# Patient Record
Sex: Female | Born: 1940 | Race: White | Hispanic: No | Marital: Married | Smoking: Never smoker
Health system: Southern US, Community
[De-identification: ages and names within clinical notes are randomized; demographics above are authoritative.]

## PROBLEM LIST (undated history)

## (undated) DIAGNOSIS — Z9289 Personal history of other medical treatment: Secondary | ICD-10-CM

## (undated) DIAGNOSIS — F419 Anxiety disorder, unspecified: Secondary | ICD-10-CM

## (undated) DIAGNOSIS — M1990 Unspecified osteoarthritis, unspecified site: Secondary | ICD-10-CM

## (undated) DIAGNOSIS — Z8669 Personal history of other diseases of the nervous system and sense organs: Secondary | ICD-10-CM

## (undated) DIAGNOSIS — K219 Gastro-esophageal reflux disease without esophagitis: Secondary | ICD-10-CM

## (undated) DIAGNOSIS — K222 Esophageal obstruction: Secondary | ICD-10-CM

## (undated) DIAGNOSIS — T8859XA Other complications of anesthesia, initial encounter: Secondary | ICD-10-CM

## (undated) DIAGNOSIS — E785 Hyperlipidemia, unspecified: Secondary | ICD-10-CM

## (undated) DIAGNOSIS — R51 Headache: Secondary | ICD-10-CM

## (undated) DIAGNOSIS — H469 Unspecified optic neuritis: Secondary | ICD-10-CM

## (undated) DIAGNOSIS — E876 Hypokalemia: Secondary | ICD-10-CM

## (undated) DIAGNOSIS — T4145XA Adverse effect of unspecified anesthetic, initial encounter: Secondary | ICD-10-CM

## (undated) DIAGNOSIS — R519 Headache, unspecified: Secondary | ICD-10-CM

## (undated) DIAGNOSIS — C801 Malignant (primary) neoplasm, unspecified: Secondary | ICD-10-CM

## (undated) DIAGNOSIS — Z9889 Other specified postprocedural states: Secondary | ICD-10-CM

## (undated) DIAGNOSIS — I959 Hypotension, unspecified: Secondary | ICD-10-CM

## (undated) DIAGNOSIS — Z8709 Personal history of other diseases of the respiratory system: Secondary | ICD-10-CM

## (undated) DIAGNOSIS — R112 Nausea with vomiting, unspecified: Secondary | ICD-10-CM

## (undated) DIAGNOSIS — F32A Depression, unspecified: Secondary | ICD-10-CM

## (undated) DIAGNOSIS — K5790 Diverticulosis of intestine, part unspecified, without perforation or abscess without bleeding: Secondary | ICD-10-CM

## (undated) DIAGNOSIS — F329 Major depressive disorder, single episode, unspecified: Secondary | ICD-10-CM

## (undated) DIAGNOSIS — K449 Diaphragmatic hernia without obstruction or gangrene: Secondary | ICD-10-CM

## (undated) DIAGNOSIS — J189 Pneumonia, unspecified organism: Secondary | ICD-10-CM

## (undated) HISTORY — PX: ESOPHAGOGASTRODUODENOSCOPY: SHX1529

## (undated) HISTORY — DX: Hyperlipidemia, unspecified: E78.5

## (undated) HISTORY — DX: Unspecified osteoarthritis, unspecified site: M19.90

## (undated) HISTORY — DX: Depression, unspecified: F32.A

## (undated) HISTORY — PX: COLONOSCOPY: SHX174

## (undated) HISTORY — PX: ABDOMINAL HYSTERECTOMY: SHX81

## (undated) HISTORY — PX: APPENDECTOMY: SHX54

## (undated) HISTORY — DX: Anxiety disorder, unspecified: F41.9

## (undated) HISTORY — DX: Esophageal obstruction: K22.2

## (undated) HISTORY — DX: Gastro-esophageal reflux disease without esophagitis: K21.9

## (undated) HISTORY — DX: Diaphragmatic hernia without obstruction or gangrene: K44.9

## (undated) HISTORY — DX: Major depressive disorder, single episode, unspecified: F32.9

---

## 1968-07-21 HISTORY — PX: NOSE SURGERY: SHX723

## 1976-07-21 HISTORY — PX: ABDOMINAL HYSTERECTOMY: SUR658

## 1996-07-21 DIAGNOSIS — H469 Unspecified optic neuritis: Secondary | ICD-10-CM

## 1996-07-21 HISTORY — DX: Unspecified optic neuritis: H46.9

## 1999-07-18 ENCOUNTER — Encounter: Payer: Self-pay | Admitting: Gynecology

## 1999-07-18 ENCOUNTER — Encounter: Admission: RE | Admit: 1999-07-18 | Discharge: 1999-07-18 | Payer: Self-pay | Admitting: Gynecology

## 2000-04-01 ENCOUNTER — Other Ambulatory Visit: Admission: RE | Admit: 2000-04-01 | Discharge: 2000-04-01 | Payer: Self-pay | Admitting: Orthopedic Surgery

## 2000-07-17 ENCOUNTER — Encounter: Admission: RE | Admit: 2000-07-17 | Discharge: 2000-07-17 | Payer: Self-pay | Admitting: Gynecology

## 2000-07-17 ENCOUNTER — Encounter: Payer: Self-pay | Admitting: Gynecology

## 2000-07-30 ENCOUNTER — Other Ambulatory Visit: Admission: RE | Admit: 2000-07-30 | Discharge: 2000-07-30 | Payer: Self-pay | Admitting: Gynecology

## 2001-07-28 ENCOUNTER — Encounter: Admission: RE | Admit: 2001-07-28 | Discharge: 2001-07-28 | Payer: Self-pay | Admitting: Gynecology

## 2001-07-28 ENCOUNTER — Encounter: Payer: Self-pay | Admitting: Gynecology

## 2002-09-23 ENCOUNTER — Encounter: Payer: Self-pay | Admitting: Gynecology

## 2002-09-23 ENCOUNTER — Encounter: Admission: RE | Admit: 2002-09-23 | Discharge: 2002-09-23 | Payer: Self-pay | Admitting: Gynecology

## 2003-03-17 ENCOUNTER — Other Ambulatory Visit: Admission: RE | Admit: 2003-03-17 | Discharge: 2003-03-17 | Payer: Self-pay | Admitting: Gynecology

## 2003-10-11 ENCOUNTER — Ambulatory Visit (HOSPITAL_COMMUNITY): Admission: RE | Admit: 2003-10-11 | Discharge: 2003-10-11 | Payer: Self-pay | Admitting: Gynecology

## 2004-10-15 ENCOUNTER — Ambulatory Visit (HOSPITAL_COMMUNITY): Admission: RE | Admit: 2004-10-15 | Discharge: 2004-10-15 | Payer: Self-pay | Admitting: Family Medicine

## 2005-11-25 ENCOUNTER — Ambulatory Visit (HOSPITAL_COMMUNITY): Admission: RE | Admit: 2005-11-25 | Discharge: 2005-11-25 | Payer: Self-pay | Admitting: Family Medicine

## 2006-02-26 ENCOUNTER — Ambulatory Visit: Payer: Self-pay | Admitting: Gastroenterology

## 2006-03-13 ENCOUNTER — Ambulatory Visit: Payer: Self-pay | Admitting: Gastroenterology

## 2006-11-30 ENCOUNTER — Ambulatory Visit (HOSPITAL_COMMUNITY): Admission: RE | Admit: 2006-11-30 | Discharge: 2006-11-30 | Payer: Self-pay | Admitting: Internal Medicine

## 2007-12-06 ENCOUNTER — Ambulatory Visit (HOSPITAL_COMMUNITY): Admission: RE | Admit: 2007-12-06 | Discharge: 2007-12-06 | Payer: Self-pay | Admitting: Family Medicine

## 2009-01-02 ENCOUNTER — Ambulatory Visit (HOSPITAL_COMMUNITY): Admission: RE | Admit: 2009-01-02 | Discharge: 2009-01-02 | Payer: Self-pay | Admitting: Family Medicine

## 2009-02-01 ENCOUNTER — Ambulatory Visit (HOSPITAL_COMMUNITY): Admission: RE | Admit: 2009-02-01 | Discharge: 2009-02-01 | Payer: Self-pay | Admitting: Family Medicine

## 2009-03-27 ENCOUNTER — Ambulatory Visit (HOSPITAL_BASED_OUTPATIENT_CLINIC_OR_DEPARTMENT_OTHER): Admission: RE | Admit: 2009-03-27 | Discharge: 2009-03-27 | Payer: Self-pay | Admitting: Otolaryngology

## 2009-03-30 ENCOUNTER — Ambulatory Visit: Payer: Self-pay | Admitting: Internal Medicine

## 2010-01-04 ENCOUNTER — Ambulatory Visit (HOSPITAL_COMMUNITY): Admission: RE | Admit: 2010-01-04 | Discharge: 2010-01-04 | Payer: Self-pay | Admitting: Family Medicine

## 2010-09-16 ENCOUNTER — Other Ambulatory Visit: Payer: Self-pay | Admitting: Family Medicine

## 2010-09-16 DIAGNOSIS — J309 Allergic rhinitis, unspecified: Secondary | ICD-10-CM | POA: Insufficient documentation

## 2010-09-16 DIAGNOSIS — E785 Hyperlipidemia, unspecified: Secondary | ICD-10-CM | POA: Insufficient documentation

## 2010-09-16 DIAGNOSIS — L219 Seborrheic dermatitis, unspecified: Secondary | ICD-10-CM | POA: Insufficient documentation

## 2010-09-23 ENCOUNTER — Ambulatory Visit
Admission: RE | Admit: 2010-09-23 | Discharge: 2010-09-23 | Disposition: A | Discharge status: Home / Self Care | Payer: Medicare Other | Source: Ambulatory Visit | Attending: Family Medicine | Admitting: Family Medicine

## 2010-09-25 ENCOUNTER — Other Ambulatory Visit: Payer: Self-pay

## 2010-09-27 DIAGNOSIS — K219 Gastro-esophageal reflux disease without esophagitis: Secondary | ICD-10-CM | POA: Insufficient documentation

## 2010-10-28 ENCOUNTER — Other Ambulatory Visit (HOSPITAL_COMMUNITY): Payer: Self-pay | Admitting: Family Medicine

## 2010-10-28 DIAGNOSIS — Z1231 Encounter for screening mammogram for malignant neoplasm of breast: Secondary | ICD-10-CM

## 2010-12-06 NOTE — Assessment & Plan Note (Signed)
[facility]                           GASTROENTEROLOGY OFFICE NOTE   NAME:Lynn Morris, Lynn Morris                          MRN:          [id number]  DATE:02/26/2006                            DOB:          11-17-1940    Lynn Morris is a 70 year old white female retiree who has had progressive  solid food dysphagia over the last several months.  She has chronic daytime  and nighttime acid reflux and has been using over-the-counter Tums.   I previously saw [facility] in 2002 and performed endoscopy and colonoscopy with  esophageal dilatation at that time.  She took Nexium for several years but  started feeling better and has not taken it in the last 2 years.  She  currently is having dysphagia in the midsubsternal area and has had some  food impactions but has not had to go to the emergency room.  She denies  hepatobiliary or lower GI problems.   PAST MEDICAL HISTORY:  Otherwise entirely noncontributory.   She is only on multivitamins, no other medications.   FAMILY HISTORY:  Remarkable for pancreatic cancer in mother.  No other known  gastrointestinal problems.  Her mother also did have cholecystectomy.  There  is no known family history of colon cancer.   SOCIAL HISTORY:  The patient has been married for 46 years.  She has a high  school education.  She is retired from [employer]  She does  not smoke or use ethanol.   REVIEW OF SYSTEMS:  Entirely noncontributory.  The patient has had a  hysterectomy and appendectomy.  She denies current cardiovascular,  pulmonary, genitourinary, neurologic, orthopedic, or endocrine problems.   PHYSICAL EXAMINATION:  GENERAL:  Healthy-appearing, middle-aged white female  in no distress.  VITAL SIGNS:  She is 5 feet 2 inches tall and weighs 159 pounds.  Blood  pressure is not recorded.  SKIN:  I cannot appreciate stigmata of chronic liver disease.  CHEST:  Entirely clear.  HEART:  There were no murmurs, gallops,  or rubs.  She seemed to be in a  regular rhythm.  ABDOMEN:  I could not appreciate hepatosplenomegaly, abdominal masses, or  tenderness.  Bowel sounds were normal.  EXTREMITIES:  Unremarkable.  NEUROLOGIC:  Mental status was normal.   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  She has no  symptoms of collagen vascular disease or Raynaud's phenomenon.   RECOMMENDATIONS:  1. Strict reflux regime with twice-a-day Nexium 40 mg 30 minutes before      breakfast and supper.  2. Outpatient endoscopy and dilatation at her convenience.  3. Try to obtain previous colonoscopy report which I cannot find in her      chart at this time.                                   Vania Rea. Jarold Motto, MD, [place]een [place], [place]   DRP/MedQ  DD:  02/26/2006  DT:  02/26/2006  Job #:  [id number]   cc:   Gloriajean Dell. Andrey Campanile, MD

## 2011-01-06 ENCOUNTER — Ambulatory Visit (HOSPITAL_COMMUNITY)
Admission: RE | Admit: 2011-01-06 | Discharge: 2011-01-06 | Disposition: A | Discharge status: Home / Self Care | Payer: Medicare Other | Source: Ambulatory Visit | Attending: Family Medicine | Admitting: Family Medicine

## 2011-01-06 DIAGNOSIS — Z1231 Encounter for screening mammogram for malignant neoplasm of breast: Secondary | ICD-10-CM | POA: Insufficient documentation

## 2011-01-14 ENCOUNTER — Other Ambulatory Visit: Payer: Self-pay | Admitting: Family Medicine

## 2011-01-14 ENCOUNTER — Telehealth: Payer: Self-pay | Admitting: Gastroenterology

## 2011-01-14 DIAGNOSIS — Z90710 Acquired absence of both cervix and uterus: Secondary | ICD-10-CM

## 2011-01-14 NOTE — Telephone Encounter (Signed)
Ov first?

## 2011-01-14 NOTE — Telephone Encounter (Signed)
Pt is due her 10 year COLON and she would like to do her EGD at the same time. Pt with hx Dilation of Stricture on 08/26/2000 and 03/13/2006. Last OV 02/26/2006. Pt reports she has trouble swallowing at times and has the feeling something is sticking in her throat since January. She is also bothered with burping and belching even though she takes Prilosec- was on Nexium, but when she retired, it wouldn't pay for it. OK to schedule ECL or does pt need an OV 1st? Thanks.

## 2011-01-15 ENCOUNTER — Encounter: Payer: Self-pay | Admitting: *Deleted

## 2011-01-15 NOTE — Telephone Encounter (Signed)
Notified pt of her appointment in August, but I will keep looking for an earlier appt.; pt stated understanding.

## 2011-02-10 ENCOUNTER — Ambulatory Visit: Payer: Medicare Other

## 2011-02-10 ENCOUNTER — Ambulatory Visit (HOSPITAL_COMMUNITY)
Admission: RE | Admit: 2011-02-10 | Discharge: 2011-02-10 | Disposition: A | Discharge status: Home / Self Care | Payer: Medicare Other | Source: Ambulatory Visit | Attending: Family Medicine | Admitting: Family Medicine

## 2011-02-10 ENCOUNTER — Other Ambulatory Visit (HOSPITAL_COMMUNITY): Payer: Self-pay | Admitting: Family Medicine

## 2011-02-10 DIAGNOSIS — E8940 Asymptomatic postprocedural ovarian failure: Secondary | ICD-10-CM

## 2011-02-10 DIAGNOSIS — Z780 Asymptomatic menopausal state: Secondary | ICD-10-CM | POA: Insufficient documentation

## 2011-02-10 DIAGNOSIS — Z13820 Encounter for screening for osteoporosis: Secondary | ICD-10-CM | POA: Insufficient documentation

## 2011-02-14 DIAGNOSIS — M8580 Other specified disorders of bone density and structure, unspecified site: Secondary | ICD-10-CM | POA: Insufficient documentation

## 2011-02-25 ENCOUNTER — Encounter: Payer: Self-pay | Admitting: Gastroenterology

## 2011-02-25 ENCOUNTER — Encounter: Payer: Self-pay | Admitting: *Deleted

## 2011-02-25 ENCOUNTER — Ambulatory Visit (INDEPENDENT_AMBULATORY_CARE_PROVIDER_SITE_OTHER): Payer: Medicare Other | Admitting: Gastroenterology

## 2011-02-25 DIAGNOSIS — K219 Gastro-esophageal reflux disease without esophagitis: Secondary | ICD-10-CM

## 2011-02-25 DIAGNOSIS — F418 Other specified anxiety disorders: Secondary | ICD-10-CM

## 2011-02-25 DIAGNOSIS — F341 Dysthymic disorder: Secondary | ICD-10-CM

## 2011-02-25 DIAGNOSIS — R1314 Dysphagia, pharyngoesophageal phase: Secondary | ICD-10-CM

## 2011-02-25 DIAGNOSIS — Z86010 Personal history of colonic polyps: Secondary | ICD-10-CM

## 2011-02-25 DIAGNOSIS — R1310 Dysphagia, unspecified: Secondary | ICD-10-CM

## 2011-02-25 MED ORDER — DEXLANSOPRAZOLE 60 MG PO CPDR: 60.0000 mg | DELAYED_RELEASE_CAPSULE | Freq: Every day | ORAL | Status: DC

## 2011-02-25 NOTE — Patient Instructions (Signed)
Your procedures have been scheduled for 02/28/2011, please follow the seperate instructions.  We have given you samples of Dexilant 60mg  to take once a day 30 min before breakfast.   Diet for GERD or PUD Nutrition therapy can help ease the discomfort of gastroesophageal reflux disease (GERD) and peptic ulcer disease (PUD).  HOME CARE INSTRUCTIONS  Eat your meals slowly, in a relaxed setting.   Eat 5 to 6 small meals per day.   If a food causes distress, stop eating it for a period of time.  FOODS TO AVOID:  Coffee, regular or decaffeinated.  Cola beverages, regular or low calorie.   Tea, regular or decaffeinated.   Pepper.   Cocoa.   High fat foods including meats.   Butter, margarine, hydrogenated oil (trans fats).  Peppermint or spearmint (if you have GERD).   Fruits and vegetables as tolerated.   Alcoholic beverages.   Nicotine (smoking or chewing). This is one of the most potent stimulants to acid production in the gastrointestinal tract.   Any food that seems to aggravate your condition.   If you have questions regarding your diet, call your caregiver's office or a registered dietitian. OTHER TIPS IF YOU HAVE GERD:  Lying flat may make symptoms worse. Keep the head of your bed raised 6 to 9 inches by using a foam wedge or blocks under the legs of the bed.   Do not lay down until 3 hours after eating a meal.   Daily physical activity may help reduce symptoms.  MAKE SURE YOU:   Understand these instructions.   Will watch your condition.   Will get help right away if you are not doing well or get worse.  Document Released: 07/07/2005 Document Re-Released: 11/23/2008    Acid Reflux (GERD) Acid reflux is also called gastroesophageal reflux disease (GERD). Your stomach makes acid to help digest food. Acid reflux happens when acid from your stomach goes into the tube between your mouth and stomach (esophagus). Your stomach is protected from the acid, but this tube  is not. When acid gets into the tube, it may cause a burning feeling in the chest (heartburn). Besides heartburn, other health problems can happen if the acid keeps going into the tube. Some causes of acid reflux include:  Being overweight.   Smoking.   Drinking alcohol.   Eating large meals.   Eating meals and then going to bed right away.   Eating certain foods.   Increased stomach acid production.  HOME CARE  Take all medicine as told by your doctor.   You may need to:   Lose weight.   Avoid alcohol.   Quit smoking.   Do not eat big meals. It is better to eat smaller meals throughout the day.   Do not eat a meal and then nap or go to bed.   Sleep with your head higher than your stomach.   Avoid foods that bother you.   You may need more tests, or you may need to see a special doctor.  GET HELP RIGHT AWAY IF:  You have chest pain that is different than before.   You have pain that goes to your arms, jaw, or between your shoulder blades.   You throw up (vomit) blood, dark brown liquid, or your throw up looks like coffee grounds.   You have trouble swallowing.   You have trouble breathing or cannot stop coughing.   You feel dizzy or pass out.   Your skin is  cool, wet, and pale.   Your medicine is not helping.  MAKE SURE YOU:   Understand these instructions.   Will watch your condition.   Will get help right away if you are not doing well or get worse.  Document Released: 12/24/2007 Document Re-Released: 10/01/2009 [facility] Patient Information 2011 [place], [place].

## 2011-02-25 NOTE — Progress Notes (Signed)
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 He continues with typical acid reflux symptoms despite daily Prilosec area and most of her symptoms are nocturnal, and she also has developed progressive solid food dysphagia. Barium swallow March was unremarkable except for small hiatal hernia. Patient denies lower GI or hepatobiliary complaints. She has had previous hysterectomy. She does not smoke or abuse ethanol. The patient also has had appendectomy. He denies Raynaud's phenomenon or any symptoms of collagen vascular disease. Her appetite is good and her weight has been rising despite dietary attempts. There is no history of any specific food intolerances. She also denies melena, hematochezia, chronic anemia, and relates a recent labs per Dr. Maryelizabeth Rowan are normal.  I have reviewed this patient's present history, medical and surgical past history, allergies and medications.     ROS: The remainder of the 10 point ROS is negative... he does complain of chronic anxiety and depression, night sweats, chronic insomnia, and occasional urinary incontinence he. No history of any cardiovascular or pulmonary complaints.     Physical Exam: General well developed well nourished patient in no acute distress, appearing her stated age Eyes PERRLA, no icterus, fundoscopic exam per opthamologist Skin no lesions noted Neck supple, no adenopathy, no thyroid enlargement, no tenderness Chest clear to percussion and auscultation Heart no significant murmurs, gallops or rubs noted Abdomen no hepatosplenomegaly masses or tenderness, BS normal.  Extremities no acute joint lesions, edema, phlebitis or evidence of cellulitis. Neurologic patient oriented x 3, cranial nerves intact, no focal neurologic deficits noted. Psychological mental status normal and  normal affect.  Assessment and plan: Chronic GERD with probable peptic stricture of her esophagus. I have changed her to Dexilant 60 mg before supper with standard anti-reflux maneuvers. Endoscopy and esophageal dilatation has been scheduled. The patient is due for a 10 year colonoscopy screening which also has been scheduled at her convenience with conscious sedation. Continue other medications per primary care.  Encounter Diagnoses  Name Primary?  . Personal history of colonic polyps   . Esophageal reflux

## 2011-02-26 ENCOUNTER — Encounter: Payer: Self-pay | Admitting: Gastroenterology

## 2011-02-28 ENCOUNTER — Encounter: Payer: Self-pay | Admitting: Gastroenterology

## 2011-02-28 ENCOUNTER — Ambulatory Visit (AMBULATORY_SURGERY_CENTER): Payer: Medicare Other | Admitting: Gastroenterology

## 2011-02-28 ENCOUNTER — Other Ambulatory Visit: Payer: Self-pay | Admitting: Gastroenterology

## 2011-02-28 DIAGNOSIS — R1314 Dysphagia, pharyngoesophageal phase: Secondary | ICD-10-CM

## 2011-02-28 DIAGNOSIS — R1310 Dysphagia, unspecified: Secondary | ICD-10-CM

## 2011-02-28 DIAGNOSIS — Z1211 Encounter for screening for malignant neoplasm of colon: Secondary | ICD-10-CM

## 2011-02-28 DIAGNOSIS — K222 Esophageal obstruction: Secondary | ICD-10-CM

## 2011-02-28 DIAGNOSIS — K219 Gastro-esophageal reflux disease without esophagitis: Secondary | ICD-10-CM

## 2011-02-28 MED ORDER — SODIUM CHLORIDE 0.9 % IV SOLN: 500.0000 mL | INTRAVENOUS | Status: DC

## 2011-02-28 MED ORDER — ESOMEPRAZOLE MAGNESIUM 40 MG PO CPDR: 40.0000 mg | DELAYED_RELEASE_CAPSULE | Freq: Every day | ORAL | Status: DC

## 2011-03-03 ENCOUNTER — Telehealth: Payer: Self-pay

## 2011-03-03 NOTE — Telephone Encounter (Signed)

## 2011-11-24 ENCOUNTER — Other Ambulatory Visit (HOSPITAL_COMMUNITY): Payer: Self-pay | Admitting: Family Medicine

## 2011-11-24 DIAGNOSIS — Z1231 Encounter for screening mammogram for malignant neoplasm of breast: Secondary | ICD-10-CM

## 2012-01-07 ENCOUNTER — Ambulatory Visit (HOSPITAL_COMMUNITY)
Admission: RE | Admit: 2012-01-07 | Discharge: 2012-01-07 | Disposition: A | Discharge status: Home / Self Care | Payer: Medicare Other | Source: Ambulatory Visit | Attending: Family Medicine | Admitting: Family Medicine

## 2012-01-07 DIAGNOSIS — Z1231 Encounter for screening mammogram for malignant neoplasm of breast: Secondary | ICD-10-CM | POA: Insufficient documentation

## 2012-03-08 ENCOUNTER — Ambulatory Visit: Payer: Medicare Other | Admitting: Internal Medicine

## 2012-12-01 ENCOUNTER — Other Ambulatory Visit (HOSPITAL_COMMUNITY): Payer: Self-pay | Admitting: Family Medicine

## 2012-12-01 DIAGNOSIS — Z1231 Encounter for screening mammogram for malignant neoplasm of breast: Secondary | ICD-10-CM

## 2013-01-10 ENCOUNTER — Ambulatory Visit (HOSPITAL_COMMUNITY)
Admission: RE | Admit: 2013-01-10 | Discharge: 2013-01-10 | Disposition: A | Discharge status: Home / Self Care | Payer: Medicare Other | Source: Ambulatory Visit | Attending: Family Medicine | Admitting: Family Medicine

## 2013-01-10 ENCOUNTER — Ambulatory Visit (HOSPITAL_COMMUNITY): Payer: Medicare Other

## 2013-01-10 DIAGNOSIS — Z1231 Encounter for screening mammogram for malignant neoplasm of breast: Secondary | ICD-10-CM | POA: Insufficient documentation

## 2013-01-17 DIAGNOSIS — F329 Major depressive disorder, single episode, unspecified: Secondary | ICD-10-CM | POA: Insufficient documentation

## 2013-01-17 DIAGNOSIS — F32A Depression, unspecified: Secondary | ICD-10-CM | POA: Insufficient documentation

## 2013-01-17 DIAGNOSIS — E559 Vitamin D deficiency, unspecified: Secondary | ICD-10-CM | POA: Insufficient documentation

## 2013-11-16 ENCOUNTER — Ambulatory Visit (INDEPENDENT_AMBULATORY_CARE_PROVIDER_SITE_OTHER): Payer: Medicare Other | Admitting: Internal Medicine

## 2013-11-16 ENCOUNTER — Encounter: Payer: Self-pay | Admitting: Internal Medicine

## 2013-11-16 VITALS — BP 114/80 | HR 64 | Ht 60.5 in | Wt 129.1 lb

## 2013-11-16 DIAGNOSIS — K219 Gastro-esophageal reflux disease without esophagitis: Secondary | ICD-10-CM

## 2013-11-16 DIAGNOSIS — R634 Abnormal weight loss: Secondary | ICD-10-CM

## 2013-11-16 DIAGNOSIS — K5730 Diverticulosis of large intestine without perforation or abscess without bleeding: Secondary | ICD-10-CM

## 2013-11-16 DIAGNOSIS — R1310 Dysphagia, unspecified: Secondary | ICD-10-CM

## 2013-11-16 NOTE — Patient Instructions (Signed)

## 2013-11-16 NOTE — Progress Notes (Signed)
HISTORY OF PRESENT ILLNESS:  Lynn Morris is a 73 y.o. female , patient of Billey Chang, who presents today regarding dysphagia and weight loss. Previous GI patient Dr. Verl Blalock but has not been seen since 2012. She has a history of GERD complicated by peptic stricture for which she has undergone prior esophageal dilation. She last underwent upper endoscopy with esophageal dilation ([address]) August 2012. Also, screening colonoscopy at that time was negative except for diverticulosis. She presents today with her husband. She reports a 9 month history of progressive intermittent solid food dysphagia associated with 30 pound weight loss. She has been off PPI for about 6 months without complaints of heartburn. GI review of systems is otherwise negative. Her chronic medical problems are minimal and stable  REVIEW OF SYSTEMS:  All non-GI ROS negative except for fatty, visual change, sleeping problems Past Medical History  Diagnosis Date  . Esophageal reflux   . Hiatal hernia   . Esophageal stricture   . Depression   . Anxiety   . Hyperlipemia   . Allergy     SEASONAL  . Arthritis     LG TOE  . Cataract     BEGINNING RT.EYE  . Hx of colonic polyps     Past Surgical History  Procedure Laterality Date  . Abdominal hysterectomy  1978    Partial   . Cesarean section  1964 & 1966  . Nose surgery  1970  . Appendectomy      Social History ADRIENE KNIPFER  reports that she has never smoked. She has never used smokeless tobacco. She reports that she does not drink alcohol or use illicit drugs.  family history includes Diabetes in her mother; Hypertension in her mother; Pancreatic cancer in her mother. There is no history of Colon cancer.  Allergies  Allergen Reactions  . Codeine        PHYSICAL EXAMINATION: Vital signs: BP 114/80  Pulse 64  Ht 5' 0.5" (1.537 m)  Wt 129 lb 2 oz (58.571 kg)  BMI 24.79 kg/m2 General: Well-developed, well-nourished, no acute  distress HEENT: Sclerae are anicteric, conjunctiva pink. Oral mucosa intact Lungs: Clear Heart: Regular Abdomen: soft, nontender, nondistended, no obvious ascites, no peritoneal signs, normal bowel sounds. No organomegaly. Extremities: No edema Psychiatric: alert and oriented x3. Cooperative   ASSESSMENT:  #1. Progressive dysphagia. History of esophageal stricture requiring dilation. Rule out recurrence of the same #2. History of GERD. No GERD symptoms on PPI #3. 30 pound weight loss. Worrisome. Rule out esophageal carcinoma or other process #4. Screening colonoscopy 2012 with diverticulosis only  PLAN:  #1. Upper endoscopy with esophageal dilation.The nature of the procedure, as well as the risks, benefits, and alternatives were carefully and thoroughly reviewed with the patient. Ample time for discussion and questions allowed. The patient understood, was satisfied, and agreed to proceed. #2. May need PPI #3. If the above unrevealing, could consider advanced imaging to further assess weight loss

## 2013-11-21 ENCOUNTER — Encounter: Payer: Self-pay | Admitting: Internal Medicine

## 2013-11-24 ENCOUNTER — Other Ambulatory Visit (INDEPENDENT_AMBULATORY_CARE_PROVIDER_SITE_OTHER): Payer: Medicare Other

## 2013-11-24 ENCOUNTER — Telehealth: Payer: Self-pay

## 2013-11-24 ENCOUNTER — Other Ambulatory Visit: Payer: Self-pay

## 2013-11-24 ENCOUNTER — Ambulatory Visit (AMBULATORY_SURGERY_CENTER): Payer: Medicare Other | Admitting: Internal Medicine

## 2013-11-24 ENCOUNTER — Encounter: Payer: Self-pay | Admitting: Internal Medicine

## 2013-11-24 VITALS — BP 107/57 | HR 60 | Temp 95.5°F | Resp 20 | Ht 60.5 in | Wt 129.0 lb

## 2013-11-24 DIAGNOSIS — D375 Neoplasm of uncertain behavior of rectum: Secondary | ICD-10-CM

## 2013-11-24 DIAGNOSIS — K319 Disease of stomach and duodenum, unspecified: Secondary | ICD-10-CM

## 2013-11-24 DIAGNOSIS — D371 Neoplasm of uncertain behavior of stomach: Secondary | ICD-10-CM

## 2013-11-24 DIAGNOSIS — R1310 Dysphagia, unspecified: Secondary | ICD-10-CM

## 2013-11-24 DIAGNOSIS — R634 Abnormal weight loss: Secondary | ICD-10-CM

## 2013-11-24 DIAGNOSIS — C801 Malignant (primary) neoplasm, unspecified: Secondary | ICD-10-CM

## 2013-11-24 DIAGNOSIS — K6389 Other specified diseases of intestine: Secondary | ICD-10-CM

## 2013-11-24 DIAGNOSIS — K3189 Other diseases of stomach and duodenum: Secondary | ICD-10-CM

## 2013-11-24 DIAGNOSIS — D378 Neoplasm of uncertain behavior of other specified digestive organs: Secondary | ICD-10-CM

## 2013-11-24 HISTORY — DX: Malignant (primary) neoplasm, unspecified: C80.1

## 2013-11-24 LAB — COMPREHENSIVE METABOLIC PANEL
ALBUMIN: 3.5 g/dL (ref 3.5–5.2)
ALT: 17 U/L (ref 0–35)
AST: 23 U/L (ref 0–37)
Alkaline Phosphatase: 47 U/L (ref 39–117)
BUN: 20 mg/dL (ref 6–23)
CALCIUM: 9.1 mg/dL (ref 8.4–10.5)
CHLORIDE: 108 meq/L (ref 96–112)
CO2: 28 mEq/L (ref 19–32)
Creatinine, Ser: 0.8 mg/dL (ref 0.4–1.2)
GFR: 70.98 mL/min (ref >60.00)
GLUCOSE: 90 mg/dL (ref 70–99)
Potassium: 5 mEq/L (ref 3.5–5.1)
Sodium: 141 mEq/L (ref 135–145)
Total Bilirubin: 0.8 mg/dL (ref 0.2–1.2)
Total Protein: 6.3 g/dL (ref 6.0–8.3)

## 2013-11-24 MED ORDER — SODIUM CHLORIDE 0.9 % IV SOLN: 500.0000 mL | INTRAVENOUS | Status: DC

## 2013-11-24 NOTE — Progress Notes (Signed)
Called to room to assist during endoscopic procedure.  Patient ID and intended procedure confirmed with present staff. Received instructions for my participation in the procedure from the performing physician.  

## 2013-11-24 NOTE — Patient Instructions (Addendum)

## 2013-11-24 NOTE — Telephone Encounter (Signed)
Pt scheduled for CT of CAP at [place] 5/11@9am . Pt to have labs drawn today. Pt to be NPO after midnight except bottle 1 of contrast at 7am, bottle 2 at [place] RN to notify pt of appt date and time.

## 2013-11-24 NOTE — Op Note (Signed)
[facility]  [address] [place], [address]   ENDOSCOPY PROCEDURE REPORT  PATIENT: Lynn, Morris  MR#: [id number] BIRTHDATE: 07/19/1941 , 73  yrs. old GENDER: Female ENDOSCOPIST: Eustace Quail, MD REFERRED BY:  .  Self / Office PROCEDURE DATE:  11/24/2013 PROCEDURE:  EGD w/ biopsy ASA CLASS:     Class II INDICATIONS:  Dysphagia. MEDICATIONS: MAC sedation, administered by CRNA and propofol (Diprivan) 250mg  IV TOPICAL ANESTHETIC: Cetacaine Spray  DESCRIPTION OF PROCEDURE: After the risks benefits and alternatives of the procedure were thoroughly explained, informed consent was obtained.  The LB [id number] endoscope was introduced through the mouth and advanced to the second portion of the duodenum. Without limitations.  The instrument was slowly withdrawn as the mucosa was fully examined.      The esophagus was normal.  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  Multiple biopsies taken.  The antrum was normal.  The duodenum was normal.  Retroflexed views revealed mass as described.     The scope was then withdrawn from the patient and the procedure completed.  COMPLICATIONS: There were no complications. ENDOSCOPIC IMPRESSION: 1. Normal esophagus 2. Proximal gastric mass as described 3. Otherwise normal exam  RECOMMENDATIONS: 1.  Await biopsy results 2.  My office will schedule a contrast-enhanced CT scan of the chest abdomen and pelvis "proximal gastric mass, evaluate"  REPEAT EXAM:  eSigned:  Eustace Quail, MD 11/24/2013 8:34 AM   CC:The Patient  ; Dierdre Forth, MD

## 2013-11-24 NOTE — Progress Notes (Signed)
CT appoint 11-28-13 9:00 am [place] CT.  Two bottles of contract given to pt Randall Hiss, RN went over CT instructions with the pt.  No complaints on discharge.  Pt to lab for creatine and BUN draw. Maw

## 2013-11-25 ENCOUNTER — Telehealth: Payer: Self-pay

## 2013-11-25 NOTE — Telephone Encounter (Signed)
  Follow up Call-  Call back number 11/24/2013  Post procedure Call Back phone  # [phone]  Permission to leave phone message Yes     Patient questions:  Do you have a fever, pain , or abdominal swelling? no Pain Score  0 *  Have you tolerated food without any problems? yes  Have you been able to return to your normal activities? yes  Do you have any questions about your discharge instructions: Diet   no Medications  no Follow up visit  no  Do you have questions or concerns about your Care? no  Actions: * If pain score is 4 or above: No action needed, pain <4.

## 2013-11-28 ENCOUNTER — Ambulatory Visit (HOSPITAL_COMMUNITY): Payer: Medicare Other

## 2013-11-28 ENCOUNTER — Ambulatory Visit (HOSPITAL_COMMUNITY)
Admission: RE | Admit: 2013-11-28 | Discharge: 2013-11-28 | Disposition: A | Discharge status: Home / Self Care | Payer: Medicare Other | Source: Ambulatory Visit | Attending: Internal Medicine | Admitting: Internal Medicine

## 2013-11-28 ENCOUNTER — Ambulatory Visit (HOSPITAL_COMMUNITY): Admission: RE | Admit: 2013-11-28 | Payer: Medicare Other | Source: Ambulatory Visit

## 2013-11-28 ENCOUNTER — Other Ambulatory Visit: Payer: Self-pay | Admitting: Internal Medicine

## 2013-11-28 ENCOUNTER — Ambulatory Visit
Admission: RE | Admit: 2013-11-28 | Discharge: 2013-11-28 | Disposition: A | Discharge status: Home / Self Care | Payer: Medicare Other | Source: Ambulatory Visit | Attending: Internal Medicine | Admitting: Internal Medicine

## 2013-11-28 DIAGNOSIS — K3189 Other diseases of stomach and duodenum: Secondary | ICD-10-CM

## 2013-11-28 DIAGNOSIS — R1909 Other intra-abdominal and pelvic swelling, mass and lump: Secondary | ICD-10-CM | POA: Insufficient documentation

## 2013-11-28 MED ORDER — IOHEXOL 300 MG/ML  SOLN
80.0000 mL | Freq: Once | INTRAMUSCULAR | Status: AC | PRN
  Administered 2013-11-28: 80 mL via INTRAVENOUS

## 2013-11-28 MED ORDER — IOHEXOL 300 MG/ML  SOLN: 80.0000 mL | Freq: Once | INTRAMUSCULAR | Status: DC | PRN

## 2013-11-29 ENCOUNTER — Telehealth: Payer: Self-pay | Admitting: Internal Medicine

## 2013-11-29 NOTE — Telephone Encounter (Signed)
Spoke with patient and she was afraid she missed a phone call about her biopsy results. Patient informed that the results are not back yet.

## 2013-11-29 NOTE — Telephone Encounter (Signed)
Patient is calling for CT scan results. Please, advise. 

## 2013-11-30 ENCOUNTER — Ambulatory Visit: Payer: Medicare Other | Admitting: Family Medicine

## 2013-12-01 ENCOUNTER — Telehealth: Payer: Self-pay | Admitting: Internal Medicine

## 2013-12-01 ENCOUNTER — Other Ambulatory Visit: Payer: Self-pay

## 2013-12-01 DIAGNOSIS — K3189 Other diseases of stomach and duodenum: Secondary | ICD-10-CM

## 2013-12-01 NOTE — Telephone Encounter (Signed)
Dr. Henrene Pastor did you call Ms. Mungin? She states she is returning your call.

## 2013-12-01 NOTE — Telephone Encounter (Signed)
See result note.  

## 2013-12-01 NOTE — Telephone Encounter (Signed)
Yes. I sent you and note a few minutes ago. Thanks

## 2013-12-06 ENCOUNTER — Other Ambulatory Visit: Payer: Self-pay | Admitting: *Deleted

## 2013-12-06 ENCOUNTER — Telehealth: Payer: Self-pay | Admitting: *Deleted

## 2013-12-06 DIAGNOSIS — C169 Malignant neoplasm of stomach, unspecified: Secondary | ICD-10-CM

## 2013-12-06 NOTE — Telephone Encounter (Signed)
Spoke with patient and confirmed appointments with Dr. Benay Spice and Dr. Barry Dienes.  Referral made to dietician as patient reports 30 lb weight loss and dysphagia.  She will see Dory Peru on 12/08/13.  Dr. Benay Spice  aware of above and is working to try to get patient in sooner if schedule allows.  Patient understands to seek medical attention for increased pain, difficulty eating/drinking, bleeding, or other concerns.

## 2013-12-08 ENCOUNTER — Telehealth: Payer: Self-pay | Admitting: *Deleted

## 2013-12-08 ENCOUNTER — Ambulatory Visit: Payer: Medicare Other | Admitting: Nutrition

## 2013-12-08 NOTE — Progress Notes (Signed)
Patient is a 73 year old female.  She is a patient of Dr. Benay Spice.  Patient's diagnosis has not been established. She has an appointment with Dr. Benay Spice tomorrow.  Past medical history includes GERD, esophageal dilatation, hiatal hernia, hyperlipidemia, anxiety and depression.  Medications include calcium with vitamin D, multivitamin.  Labs were reviewed.  Height: 5 feet 1/2-inch. Weight: 129 pounds May 7. Usual body weight: 164 pounds 02/28/2011.  Per patient this was usual body weight. BMI: 24.77.  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  She has had difficulty swallowing and has lost approximately 30 pounds over the past 9 months.  Patient tolerates liquids without difficulty. She also tolerates some very soft, moist foods.  Patient describes some pain with oral intake.  Nutrition diagnosis: Unintended weight loss related to dysphasia and inadequate oral intake as evidenced by 21% weight loss over 9 months.  Intervention: Patient was educated to consume 3 meals with 3 snacks daily consisting of high-calorie, high-protein foods.  I educated patient on how to puree foods for increase variety.  Recommended patient consume oral nutrition supplements twice a day to 3 times a day between meals.  Provided a variety of oral nutrition supplements for patient to sample.  Provided fact sheets on increasing calories and protein, dysphasia, easy to swallow snacks.  Provided contact information.  Questions answered.  Teach back method used.  Monitoring, evaluation, goals: Patient will tolerate increased calories and protein to minimize further weight loss.  Next visit: Will be determined once treatment plan established.

## 2013-12-08 NOTE — Telephone Encounter (Signed)
Spoke with patient during visit with dietician.  Dr. Benay Spice has moved her appointment to 12/09/13.  Patient is aware.

## 2013-12-09 ENCOUNTER — Encounter: Payer: Self-pay | Admitting: Oncology

## 2013-12-09 ENCOUNTER — Ambulatory Visit (HOSPITAL_BASED_OUTPATIENT_CLINIC_OR_DEPARTMENT_OTHER): Payer: Medicare Other | Admitting: Oncology

## 2013-12-09 ENCOUNTER — Telehealth: Payer: Self-pay | Admitting: Oncology

## 2013-12-09 ENCOUNTER — Ambulatory Visit (HOSPITAL_BASED_OUTPATIENT_CLINIC_OR_DEPARTMENT_OTHER): Payer: Medicare Other

## 2013-12-09 ENCOUNTER — Encounter (INDEPENDENT_AMBULATORY_CARE_PROVIDER_SITE_OTHER): Payer: Self-pay

## 2013-12-09 VITALS — BP 112/74 | HR 81 | Temp 97.9°F | Resp 18 | Ht 60.5 in | Wt 120.9 lb

## 2013-12-09 DIAGNOSIS — C169 Malignant neoplasm of stomach, unspecified: Secondary | ICD-10-CM

## 2013-12-09 DIAGNOSIS — K219 Gastro-esophageal reflux disease without esophagitis: Secondary | ICD-10-CM

## 2013-12-09 DIAGNOSIS — C160 Malignant neoplasm of cardia: Secondary | ICD-10-CM

## 2013-12-09 NOTE — Telephone Encounter (Signed)
gv pt appt schedule for june/july. pt already on schedule for appt w/dr moody and this is included in scheduled. central will call pt w/pet appt. pt aware.

## 2013-12-09 NOTE — [facility] Oncology Navigator Note (Signed)
Met with Lynn Morris and family. Explained role of nurse navigator. Educational information provided on esophagus/GE junction cancer  Referral made to dietician for diet education-she has already been seen. [place] resources provided to patient, including SW service information.  Contact names and phone numbers were provided for entire [facility] team.  Teach back method was used.  No barriers to care identified.  Will continue to follow as needed.

## 2013-12-09 NOTE — Progress Notes (Signed)
Checked in new patient with no financial issues at this time and has not seen the dr. [place] been out of the country

## 2013-12-09 NOTE — Progress Notes (Signed)
[place] New Patient Consult   Referring MD: Omelia Marquart 73 y.o.  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 Sharlett Iles. She had a peptic stricture and underwent an esophageal dilatation in August of 2012. She reports increased solid dysphagia beginning in the summer of 2014. Reflux symptoms improved. The dysphasia progressed and she lost weight. She was referred to Dr. Henrene Pastor 11/16/2013.  She was taken an upper endoscopy 11/24/2013. The esophagus appeared normal. An infiltrating mass involved the proximal stomach including the cardia to the level of the GE junction. Multiple biopsies were obtained. Antrum appeared normal. The duodenum was normal. The pathology ([id number]) confirmed small foci of atypical individual cells. The findings worse highly suspicious for poorly differentiated carcinoma, but limited for definitive diagnosis. An H. pylori stain was negative.  She was referred for CTs of the chest, abdomen, and pelvis on 11/28/2013. No pathologically enlarged mediastinal or hilar lymph nodes. Asymmetric thickening of the distal esophagus. No suspicious pulmonary nodules or masses. No pleural effusion. Asymmetric thickening was noted along the lesser curvature of the stomach near the gastroesophageal junction. A 6 mm gastrohepatic ligament lymph node was nonspecific. No other lymphadenopathy. The liver and bilateral adrenal glands appeared unremarkable. No ascites.    Past Medical History  Diagnosis Date  . Esophageal reflux   . Hiatal hernia   . Esophageal stricture, status post esophageal dilatation   August 2012   . Depression   . Anxiety   . Hyperlipemia   . Allergy     SEASONAL  . Arthritis     LG TOE  . Cataract     BEGINNING RT.EYE  . Hx of colonic polyps     .   G2 P2  Past Surgical History  Procedure Laterality Date    . Abdominal hysterectomy  1978    Partial   . Cesarean section  1964 & 1966  . Nose surgery  1970  . Appendectomy      Medications: Reviewed  Allergies:  Allergies  Allergen Reactions  . Codeine     Family history: 2 brothers and 2 sisters. Her mother had cervical cancer in her 32s. No other family history of cancer.  Social History:   She lives with her husband outside of [place]. She is retired from an office occupation. She does not use tobacco or alcohol. No transfusion history. No risk factor for HIV or hepatitis.  History  Alcohol Use No    History  Smoking status  . Never Smoker   Smokeless tobacco  . Never Used      ROS:   Positives include: Solid dysphagia, 35 pound weight loss since the summer of 2014, occasional nausea and vomiting, anorexia  A complete ROS was otherwise negative.  Physical Exam:  Blood pressure 112/74, pulse 81, temperature 97.9 F (36.6 C), temperature source Oral, resp. rate 18, height 5' 0.5" (1.537 m), weight 120 lb 14.4 oz (54.84 kg).  HEENT: Oropharynx without visible mass, neck without mass Lungs: Clear bilaterally Cardiac: Regular rate and rhythm Abdomen: No hepatosplenomegaly, nontender, no mass  Vascular: No leg edema Lymph nodes: No cervical, supra-clavicular, right axillary, or inguinal nodes. Soft mobile 1/2 cm left axillary node Neurologic: Alert and oriented, the motor exam appears intact in the upper and lower extremities Skin: No rash Musculoskeletal: No spine   LAB:  CMP  Component Value Date/Time   NA 141 11/24/2013 0923   K 5.0 11/24/2013 0923   CL 108 11/24/2013 0923   CO2 28 11/24/2013 0923   GLUCOSE 90 11/24/2013 0923   BUN 20 11/24/2013 0923   CREATININE 0.8 11/24/2013 0923   CALCIUM 9.1 11/24/2013 0923   PROT 6.3 11/24/2013 0923   ALBUMIN 3.5 11/24/2013 0923   AST 23 11/24/2013 0923   ALT 17 11/24/2013 0923   ALKPHOS 47 11/24/2013 0923   BILITOT 0.8 11/24/2013 0923    Imaging:  I reviewed the 11/28/2013  abdomen CT with Ms. Barbato and her husband. 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 History of gastroesophageal reflux disease and peptic stricture, status post an esophageal dilatation procedure in August 2012 3. Anorexia/weight loss 4. Solid dysphagia secondary to #1 5. Anxiety   Disposition:   Ms. Hoppel has been diagnosed with a proximal gastric mass. The mass involved the gastric cardia, upper fundus, and extends to the gastroesophageal junction. A biopsy is highly suspicious for poorly differentiated carcinoma. 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 I discussed treatment options with Ms. Mirkin and her husband. We will refer her to Dr. Ardis Hughs for an endoscopic ultrasound to better characterize the extent of the tumor (involvement limited to the upper stomach?) And for a repeat biopsy.  If a diagnosis of upper gastric/GE junction carcinoma is confirmed I recommend neoadjuvant chemotherapy and radiation. This will be followed by surgical resection if the tumor is localized. She is scheduled for an appointment with Dr. Barry Dienes 12/19/2013. We will make a referral to Dr. Servando Snare as indicated. She is scheduled to see Dr. Lisbeth Renshaw 12/21/2013.  Ms. Tappan will be referred for a staging PET scan.  I recommend weekly Taxol/carboplatin to be given concurrent with radiation. We discussed the potential toxicities associated with this chemotherapy regimen including the chance for nausea/vomiting, mucositis, diarrhea, alopecia, and hematologic toxicity. We discussed the neuropathy,  allergic reaction, and bone pain associated with Taxol. We discussed the potential for an allergic reaction to carboplatin. She agrees to proceed. She will attend a chemotherapy teaching class.  Her case will be presented at the GI tumor conference 12/21/2013.  Approximately 50 minutes were spent with patient today. The majority of the time was used for counseling and coordination of care.  Ladell Pier 12/09/2013, 3:49 PM

## 2013-12-13 ENCOUNTER — Telehealth: Payer: Self-pay

## 2013-12-13 ENCOUNTER — Other Ambulatory Visit: Payer: Self-pay

## 2013-12-13 ENCOUNTER — Telehealth: Payer: Self-pay | Admitting: *Deleted

## 2013-12-13 ENCOUNTER — Encounter (HOSPITAL_COMMUNITY): Payer: Self-pay | Admitting: *Deleted

## 2013-12-13 ENCOUNTER — Encounter (HOSPITAL_COMMUNITY): Payer: Self-pay | Admitting: Pharmacy Technician

## 2013-12-13 DIAGNOSIS — K449 Diaphragmatic hernia without obstruction or gangrene: Secondary | ICD-10-CM

## 2013-12-13 DIAGNOSIS — R933 Abnormal findings on diagnostic imaging of other parts of digestive tract: Secondary | ICD-10-CM

## 2013-12-13 HISTORY — DX: Diaphragmatic hernia without obstruction or gangrene: K44.9

## 2013-12-13 NOTE — Telephone Encounter (Signed)
EUS scheduled Left message on machine to call back

## 2013-12-13 NOTE — Telephone Encounter (Signed)
Message copied by Barron Alvine on Tue Dec 13, 2013  8:12 AM ------      Message from: Milus Banister      Created: Tue Dec 13, 2013  7:48 AM       Lynn Morris      She needs upper EUS, next week ((june 4th).  ++MAC for GE junction cancer staging.  Thanks            dj                  ----- Message -----         From: Barron Alvine, CMA         Sent: 12/09/2013   4:30 PM           To: Milus Banister, MD                        ----- Message -----         From: Carola Frost, RN         Sent: 12/09/2013   4:27 PM           To: Milus Banister, MD, Barron Alvine, CMA, #            Hi Dr. Ardis Hughs,            Dr. Benay Spice would like for you to see the patient as soon as possible for repeat upper endoscopy and EUS biopsy.            Thanks,      Marcellus Scott       ------

## 2013-12-13 NOTE — Telephone Encounter (Signed)
Call from [facility] med/onc voice message that patient Pet scan scheduled June 11/2013, Dr,moody visit 12/21/13, do we need to change visit?, will e-mail MD he is off today, awaiting status from Dr,.Moody 10:30 AM

## 2013-12-14 NOTE — Telephone Encounter (Signed)
EUS scheduled, pt instructed and medications reviewed.  Patient instructions mailed to home.  Patient to call with any questions or concerns.  

## 2013-12-16 ENCOUNTER — Encounter: Payer: Self-pay | Admitting: *Deleted

## 2013-12-16 NOTE — Progress Notes (Signed)
Called Lynn Morris last night per Dr.Moody he cn still see her before her Pet scan,keep her appt. As scheduled,patient gave verbal understanding .

## 2013-12-19 ENCOUNTER — Encounter: Payer: Self-pay | Admitting: Radiation Oncology

## 2013-12-19 ENCOUNTER — Other Ambulatory Visit: Payer: Self-pay | Admitting: *Deleted

## 2013-12-19 ENCOUNTER — Encounter (INDEPENDENT_AMBULATORY_CARE_PROVIDER_SITE_OTHER): Payer: Self-pay | Admitting: General Surgery

## 2013-12-19 ENCOUNTER — Ambulatory Visit (INDEPENDENT_AMBULATORY_CARE_PROVIDER_SITE_OTHER): Payer: Medicare Other | Admitting: General Surgery

## 2013-12-19 VITALS — BP 110/78 | HR 78 | Temp 97.8°F | Resp 16 | Ht 65.0 in | Wt 121.6 lb

## 2013-12-19 DIAGNOSIS — C169 Malignant neoplasm of stomach, unspecified: Secondary | ICD-10-CM

## 2013-12-19 DIAGNOSIS — C160 Malignant neoplasm of cardia: Secondary | ICD-10-CM

## 2013-12-19 MED ORDER — PROCHLORPERAZINE MALEATE 10 MG PO TABS: 10.0000 mg | ORAL_TABLET | Freq: Four times a day (QID) | ORAL | Status: DC | PRN

## 2013-12-19 MED ORDER — DEXAMETHASONE 2 MG PO TABS: 2.0000 mg | ORAL_TABLET | ORAL | Status: DC

## 2013-12-19 NOTE — Progress Notes (Addendum)
GI Location of Tumor / Histology: Mass  Lesion upper stomach extending to the gastroesophageal junction   Patient presented  months ago with symptoms of: long hx of GERD, solid dysphasia summer 2014,referred to Dr.Perry, EGD 11/24/13  Biopsies of  (if applicable) revealed: Diagnosis 11/24/13: Surgical [P], proximal gastric mass, biopsy ATYPICAL CELLS PRESENT, HIGHLY SUSPICIOUS FOR CARCINOMA, SEE COMMENT. Microscopic CommentMultiple biopsy fragments show only a few very small foci of atypical individual cells characterized by highnuclear cytoplasmic ratio, hyperchromasia and scanty eosinophilic cytoplasm. The foci in question are mostly exhausted on deeper levels of sectioning and hence somewhat difficult to evaluate. Nonetheless,immunohistochemical stains for cytokeratin AE1/AE3, CDX-2 and estrogen receptor were performed withappropriate controls. Rare residual foci in question show positivity for cytokeratin AE1/AE3 and CDX-2 andnegativity for estrogen receptor. The findings are highly suspicious for poorly differentiated carcinoma.However the changes are considered extremely limited for definitive diagnosis. A Warthin-Starry stain isperformed to determine the possibility of the presence of Helicobacter pylori. The Warthin-Starry stain isnegative for organisms of Helicobacter pylori. (BNS:kh 11-30-13)  Past/Anticipated interventions by surgeon, if any: Esophageal stricture, s/p esophageal dilatation 02/2011, Dr. Barry Dienes appt 12/19/13,   Past/Anticipated interventions by medical oncology, if any:  Chemo education 12/20/13,, Pet scan 12/23/13, Dr. Servando Snare appt 12/26/13  12/27/13 Portacath  Placement , Dr. Barry Dienes,  At [facility], Dr. Benay Spice  appt 01/02/14 with Chemotherapy infusion  Weight changes, if any: 35 lbs since summer 2014  Bowel/Bladder complaints, if any: hx colonic polyps, abdominal partial hyesterectomy , regular bowel movements once daily, bladderno issues  Nausea / Vomiting, if any: none  Pain issues, if  any:  None at present, only after eating solids, hurts bottom of sternum, cuts food up real small amounts  SAFETY ISSUES:none  Prior radiation? No  Pacemaker/ICD? No  Possible current pregnancy? No  Is the patient on methotrexate? No  Current Complaints / other details:  Married, Retired, G2P2, C-sections, mother had cervical cancer in her 69's, non smoker, never used smokeles tobacco,

## 2013-12-19 NOTE — Patient Instructions (Signed)
Implanted Port Insertion An implanted port is a central line that has a round shape and is placed under the skin. It is used as a long-term IV access for:   Medicines, such as chemotherapy.   Fluids.   Liquid nutrition, such as total parenteral nutrition (TPN).   Blood samples.  LET YOUR HEALTH CARE PROVIDER KNOW ABOUT:  Allergies to food or medicine.   Medicines taken, including vitamins, herbs, eye drops, creams, and over-the-counter medicines.   Any allergies to heparin.  Use of steroids (by mouth or creams).   Previous problems with anesthetics or numbing medicines.   History of bleeding problems or blood clots.   Previous surgery.   Other health problems, including diabetes and kidney problems.   Possibility of pregnancy, if this applies. RISKS AND COMPLICATIONS  Damage to the blood vessel, bruising, or bleeding at the puncture site.   Infection.  Blood clot in the vessel that the port is in.  Breakdown of the skin over your port.  Very rarely a person may develop a condition called a pneumothorax, a collection of air in the chest that may cause one of the lungs to collapse. The placement of these catheters with the appropriate imaging guidance significantly decreases the risk of a pneumothorax.  BEFORE THE PROCEDURE   Your health care provider may want you to have blood tests. These tests can help tell how well your kidneys and liver are working. They can also show how well your blood clots.   If you take blood thinners (anticoagulant medicines), ask your health care provider when you should stop taking them.   Make arrangements for someone to drive you home. This is necessary if you have been sedated for your procedure.  PROCEDURE  Port insertion usually takes about 30 45 minutes.   An IV needle will be inserted in your arm. Medicine for pain and medicine to help relax you (sedative) will flow directly into your body through this needle.    You will lie on an exam table, and you will be connected to monitors to keep track of your heart rate, blood pressure, and breathing throughout the procedure.  An oxygen monitoring device may be attached to your finger. Oxygen will be given.   Everything is kept as germ free (sterile) as possible during the procedure. The skin near the point of the incision will be cleaned with antiseptic, and the area will be draped with sterile towels. The skin and deeper tissues over the port area will be made numb with a local anesthetic.  Two small cuts (incisions) will be made in the skin to insert the port. One will be made in the neck to obtain access to the vein where the catheter will lie.   Because the port reservoir is placed under the skin, a small skin incision is made in the upper chest, and a small pocket for the port is made under the skin. The catheter connected to the port tunnels to a large central vein in the chest. A small, raised area remains on your body at the site of the reservoir when the procedure is complete.  The port placement is done under imaging guidance to ensure the proper placement.  The reservoir has a silicone covering that can be punctured with a special needle.   The port is flushed with normal saline, and blood is drawn to make sure it is working properly.  There is nothing remaining outside the skin when the procedure is finished.     Incisions are held together by stitches, surgical glue, or a special tape.  AFTER THE PROCEDURE  You will stay in a recovery area until the anesthesia has worn off. Your blood pressure and pulse will be checked.  A final chest X-ray is taken to check the placement of the port and that there is no injury to your lung.  If there are no problems, you should be able to go home after the procedure.  Document Released: 04/27/2013 Document Reviewed: 03/14/2013 [facility] Patient Information 2014 [place], [place].

## 2013-12-20 ENCOUNTER — Ambulatory Visit: Payer: Medicare Other | Admitting: Oncology

## 2013-12-20 ENCOUNTER — Other Ambulatory Visit: Payer: Medicare Other

## 2013-12-20 ENCOUNTER — Other Ambulatory Visit: Payer: Self-pay | Admitting: *Deleted

## 2013-12-20 ENCOUNTER — Ambulatory Visit: Payer: Medicare Other

## 2013-12-20 ENCOUNTER — Other Ambulatory Visit (HOSPITAL_BASED_OUTPATIENT_CLINIC_OR_DEPARTMENT_OTHER): Payer: Medicare Other

## 2013-12-20 ENCOUNTER — Encounter: Payer: Self-pay | Admitting: *Deleted

## 2013-12-20 DIAGNOSIS — C169 Malignant neoplasm of stomach, unspecified: Secondary | ICD-10-CM

## 2013-12-20 DIAGNOSIS — C160 Malignant neoplasm of cardia: Secondary | ICD-10-CM

## 2013-12-20 LAB — COMPREHENSIVE METABOLIC PANEL (CC13)
ALBUMIN: 3.3 g/dL — AB (ref 3.5–5.0)
ALK PHOS: 50 U/L (ref 40–150)
ALT: 11 U/L (ref 0–55)
ANION GAP: 11 meq/L (ref 3–11)
AST: 19 U/L (ref 5–34)
BUN: 16.3 mg/dL (ref 7.0–26.0)
CO2: 23 meq/L (ref 22–29)
Calcium: 9.1 mg/dL (ref 8.4–10.4)
Chloride: 107 mEq/L (ref 98–109)
Creatinine: 0.8 mg/dL (ref 0.6–1.1)
GLUCOSE: 82 mg/dL (ref 70–140)
POTASSIUM: 4.1 meq/L (ref 3.5–5.1)
Sodium: 142 mEq/L (ref 136–145)
Total Bilirubin: 0.54 mg/dL (ref 0.20–1.20)
Total Protein: 6.3 g/dL — ABNORMAL LOW (ref 6.4–8.3)

## 2013-12-20 LAB — CBC WITH DIFFERENTIAL/PLATELET
BASO%: 0.6 % (ref 0.0–2.0)
BASOS ABS: 0 10*3/uL (ref 0.0–0.1)
EOS ABS: 0.1 10*3/uL (ref 0.0–0.5)
EOS%: 2.7 % (ref 0.0–7.0)
HCT: 39.5 % (ref 34.8–46.6)
HEMOGLOBIN: 13.2 g/dL (ref 11.6–15.9)
LYMPH%: 28.3 % (ref 14.0–49.7)
MCH: 29 pg (ref 25.1–34.0)
MCHC: 33.5 g/dL (ref 31.5–36.0)
MCV: 86.5 fL (ref 79.5–101.0)
MONO#: 0.5 10*3/uL (ref 0.1–0.9)
MONO%: 9.3 % (ref 0.0–14.0)
NEUT%: 59.1 % (ref 38.4–76.8)
NEUTROS ABS: 3.1 10*3/uL (ref 1.5–6.5)
PLATELETS: 249 10*3/uL (ref 145–400)
RBC: 4.57 10*6/uL (ref 3.70–5.45)
RDW: 12.8 % (ref 11.2–14.5)
WBC: 5.3 10*3/uL (ref 3.9–10.3)
lymph#: 1.5 10*3/uL (ref 0.9–3.3)

## 2013-12-20 MED ORDER — LIDOCAINE-PRILOCAINE 2.5-2.5 % EX CREA: TOPICAL_CREAM | CUTANEOUS | Status: DC

## 2013-12-21 ENCOUNTER — Ambulatory Visit
Admission: RE | Admit: 2013-12-21 | Discharge: 2013-12-21 | Disposition: A | Discharge status: Home / Self Care | Payer: Medicare Other | Source: Ambulatory Visit | Attending: Radiation Oncology | Admitting: Radiation Oncology

## 2013-12-21 ENCOUNTER — Encounter: Payer: Self-pay | Admitting: Radiation Oncology

## 2013-12-21 ENCOUNTER — Telehealth: Payer: Self-pay | Admitting: *Deleted

## 2013-12-21 VITALS — BP 112/76 | HR 77 | Temp 97.7°F | Resp 20 | Ht 60.0 in | Wt 123.2 lb

## 2013-12-21 DIAGNOSIS — C169 Malignant neoplasm of stomach, unspecified: Secondary | ICD-10-CM

## 2013-12-21 DIAGNOSIS — C160 Malignant neoplasm of cardia: Secondary | ICD-10-CM | POA: Insufficient documentation

## 2013-12-21 DIAGNOSIS — Z510 Encounter for antineoplastic radiation therapy: Secondary | ICD-10-CM | POA: Insufficient documentation

## 2013-12-21 HISTORY — DX: Malignant (primary) neoplasm, unspecified: C80.1

## 2013-12-21 LAB — CEA: CEA: 0.7 ng/mL (ref 0.0–5.0)

## 2013-12-21 NOTE — Telephone Encounter (Signed)
Met with patient during MD visit.  Reviewed schedule and answered questions.  She acknowledged where to go tomorrow for EUS.  She had no further questions.

## 2013-12-21 NOTE — Progress Notes (Signed)
Please see the Nurse Progress Note in the MD Initial Consult Encounter for this patient. 

## 2013-12-22 ENCOUNTER — Ambulatory Visit (HOSPITAL_COMMUNITY)
Admission: RE | Admit: 2013-12-22 | Discharge: 2013-12-22 | Disposition: A | Discharge status: Home / Self Care | Payer: Medicare Other | Source: Ambulatory Visit | Attending: Gastroenterology | Admitting: Gastroenterology

## 2013-12-22 ENCOUNTER — Encounter (HOSPITAL_COMMUNITY): Payer: Self-pay | Admitting: *Deleted

## 2013-12-22 ENCOUNTER — Encounter (HOSPITAL_COMMUNITY)
Admission: RE | Disposition: A | Discharge status: Home / Self Care | Payer: Self-pay | Source: Ambulatory Visit | Attending: Gastroenterology

## 2013-12-22 ENCOUNTER — Encounter (HOSPITAL_COMMUNITY): Payer: Medicare Other | Admitting: Anesthesiology

## 2013-12-22 ENCOUNTER — Ambulatory Visit (HOSPITAL_COMMUNITY): Payer: Medicare Other | Admitting: Anesthesiology

## 2013-12-22 ENCOUNTER — Encounter (HOSPITAL_COMMUNITY): Payer: Self-pay | Admitting: Pharmacy Technician

## 2013-12-22 DIAGNOSIS — Z860100 Personal history of colon polyps, unspecified: Secondary | ICD-10-CM | POA: Insufficient documentation

## 2013-12-22 DIAGNOSIS — K219 Gastro-esophageal reflux disease without esophagitis: Secondary | ICD-10-CM | POA: Insufficient documentation

## 2013-12-22 DIAGNOSIS — Z885 Allergy status to narcotic agent status: Secondary | ICD-10-CM | POA: Insufficient documentation

## 2013-12-22 DIAGNOSIS — E785 Hyperlipidemia, unspecified: Secondary | ICD-10-CM | POA: Insufficient documentation

## 2013-12-22 DIAGNOSIS — F411 Generalized anxiety disorder: Secondary | ICD-10-CM | POA: Insufficient documentation

## 2013-12-22 DIAGNOSIS — K449 Diaphragmatic hernia without obstruction or gangrene: Secondary | ICD-10-CM | POA: Insufficient documentation

## 2013-12-22 DIAGNOSIS — F329 Major depressive disorder, single episode, unspecified: Secondary | ICD-10-CM | POA: Insufficient documentation

## 2013-12-22 DIAGNOSIS — R933 Abnormal findings on diagnostic imaging of other parts of digestive tract: Secondary | ICD-10-CM

## 2013-12-22 DIAGNOSIS — C169 Malignant neoplasm of stomach, unspecified: Secondary | ICD-10-CM | POA: Insufficient documentation

## 2013-12-22 DIAGNOSIS — F3289 Other specified depressive episodes: Secondary | ICD-10-CM | POA: Insufficient documentation

## 2013-12-22 DIAGNOSIS — H269 Unspecified cataract: Secondary | ICD-10-CM | POA: Insufficient documentation

## 2013-12-22 HISTORY — DX: Nausea with vomiting, unspecified: R11.2

## 2013-12-22 HISTORY — DX: Other specified postprocedural states: Z98.890

## 2013-12-22 HISTORY — DX: Other complications of anesthesia, initial encounter: T88.59XA

## 2013-12-22 HISTORY — DX: Adverse effect of unspecified anesthetic, initial encounter: T41.45XA

## 2013-12-22 HISTORY — PX: EUS: SHX5427

## 2013-12-22 SURGERY — UPPER ENDOSCOPIC ULTRASOUND (EUS) LINEAR
Anesthesia: Monitor Anesthesia Care

## 2013-12-22 MED ORDER — LIDOCAINE HCL (CARDIAC) 20 MG/ML IV SOLN
INTRAVENOUS | Status: DC | PRN
  Administered 2013-12-22: 100 mg via INTRAVENOUS

## 2013-12-22 MED ORDER — PROPOFOL INFUSION 10 MG/ML OPTIME
INTRAVENOUS | Status: DC | PRN
  Administered 2013-12-22: 120 ug/kg/min via INTRAVENOUS

## 2013-12-22 MED ORDER — MIDAZOLAM HCL 5 MG/5ML IJ SOLN
INTRAMUSCULAR | Status: DC | PRN
Start: 2013-12-22 — End: 2013-12-22
  Administered 2013-12-22 (×2): 1 mg via INTRAVENOUS

## 2013-12-22 MED ORDER — SODIUM CHLORIDE 0.9 % IV SOLN: INTRAVENOUS | Status: DC

## 2013-12-22 MED ORDER — LIDOCAINE HCL (CARDIAC) 20 MG/ML IV SOLN
INTRAVENOUS | Status: AC
  Filled 2013-12-22: qty 5

## 2013-12-22 MED ORDER — KETAMINE HCL 10 MG/ML IJ SOLN
INTRAMUSCULAR | Status: AC
  Filled 2013-12-22: qty 1

## 2013-12-22 MED ORDER — PROPOFOL 10 MG/ML IV BOLUS
INTRAVENOUS | Status: DC | PRN
  Administered 2013-12-22: 20 mg via INTRAVENOUS
  Administered 2013-12-22: 30 mg via INTRAVENOUS

## 2013-12-22 MED ORDER — FENTANYL CITRATE 0.05 MG/ML IJ SOLN: 25.0000 ug | INTRAMUSCULAR | Status: DC | PRN

## 2013-12-22 MED ORDER — LACTATED RINGERS IV SOLN
INTRAVENOUS | Status: DC
  Administered 2013-12-22: 1000 mL via INTRAVENOUS

## 2013-12-22 MED ORDER — PROPOFOL 10 MG/ML IV BOLUS
INTRAVENOUS | Status: AC
  Filled 2013-12-22: qty 20

## 2013-12-22 MED ORDER — MIDAZOLAM HCL 2 MG/2ML IJ SOLN
INTRAMUSCULAR | Status: AC
  Filled 2013-12-22: qty 2

## 2013-12-22 MED ORDER — SODIUM CHLORIDE 0.9 % IJ SOLN
INTRAMUSCULAR | Status: AC
  Filled 2013-12-22: qty 10

## 2013-12-22 MED ORDER — PROPOFOL 10 MG/ML IV BOLUS
INTRAVENOUS | Status: AC
Start: 2013-12-22 — End: 2013-12-22
  Filled 2013-12-22: qty 20

## 2013-12-22 NOTE — Discharge Instructions (Signed)
YOU HAD AN ENDOSCOPIC PROCEDURE TODAY: Refer to the procedure report that was given to you for any specific questions about what was found during the examination.  If the procedure report does not answer your questions, please call your gastroenterologist to clarify. ° °YOU SHOULD EXPECT: Some feelings of bloating in the abdomen. Passage of more gas than usual.  Walking can help get rid of the air that was put into your GI tract during the procedure and reduce the bloating. If you had a lower endoscopy (such as a colonoscopy or flexible sigmoidoscopy) you may notice spotting of blood in your stool or on the toilet paper.  ° °DIET: Your first meal following the procedure should be a light meal and then it is ok to progress to your normal diet.  A half-sandwich or bowl of soup is an example of a good first meal.  Heavy or fried foods are harder to digest and may make you feel nasueas or bloated.  Drink plenty of fluids but you should avoid alcoholic beverages for 24 hours. ° °ACTIVITY: Your care partner should take you home directly after the procedure.  You should plan to take it easy, moving slowly for the rest of the day.  You can resume normal activity the day after the procedure however you should NOT DRIVE or use heavy machinery for 24 hours (because of the sedation medicines used during the test).   ° °SYMPTOMS TO REPORT IMMEDIATELY  °A gastroenterologist can be reached at any hour.  Please call your doctor's office for any of the following symptoms: ° °· Following lower endoscopy (colonoscopy, flexible sigmoidoscopy) ° Excessive amounts of blood in the stool ° Significant tenderness, worsening of abdominal pains ° Swelling of the abdomen that is new, acute ° Fever of 100° or higher °· Following upper endoscopy (EGD, EUS, ERCP) ° Vomiting of blood or coffee ground material ° New, significant abdominal pain ° New, significant chest pain or pain under the shoulder blades ° Painful or persistently difficult  swallowing ° New shortness of breath ° Black, tarry-looking stools ° °FOLLOW UP: °If any biopsies were taken you will be contacted by phone or by letter within the next 1-3 weeks.  Call your gastroenterologist if you have not heard about the biopsies in 3 weeks.  °Please also call your gastroenterologist's office with any specific questions about appointments or follow up tests. °Gastrointestinal Endoscopy °Care After °Refer to this sheet in the next few weeks. These instructions provide you with information on caring for yourself after your procedure. Your caregiver may also give you more specific instructions. Your treatment has been planned according to current medical practices, but problems sometimes occur. Call your caregiver if you have any problems or questions after your procedure. °HOME CARE INSTRUCTIONS °· If you were given medicine to help you relax (sedative), do not drive, operate machinery, or sign important documents for 24 hours. °· Avoid alcohol and hot or warm beverages for the first 24 hours after the procedure. °· Only take over-the-counter or prescription medicines for pain, discomfort, or fever as directed by your caregiver. You may resume taking your normal medicines unless your caregiver tells you otherwise. Ask your caregiver when you may resume taking medicines that may cause bleeding, such as aspirin, clopidogrel, or warfarin. °· You may return to your normal diet and activities on the day after your procedure, or as directed by your caregiver. Walking may help to reduce any bloated feeling in your abdomen. °· Drink enough fluids to   keep your urine clear or pale yellow. °· You may gargle with salt water if you have a sore throat. °SEEK IMMEDIATE MEDICAL CARE IF: °· You have severe nausea or vomiting. °· You have severe abdominal pain, abdominal cramps that last longer than 6 hours, or abdominal swelling (distention). °· You have severe shoulder or back pain. °· You have trouble  swallowing. °· You have shortness of breath, your breathing is shallow, or you are breathing faster than normal. °· You have a fever or a rapid heartbeat. °· You vomit blood or material that looks like coffee grounds. °· You have bloody, black, or tarry stools. °MAKE SURE YOU: °· Understand these instructions. °· Will watch your condition. °· Will get help right away if you are not doing well or get worse. °Document Released: 02/19/2004 Document Revised: 01/06/2012 Document Reviewed: 10/07/2011 °[employer]® Patient Information ©2014 [employer]. ° °

## 2013-12-22 NOTE — Interval H&P Note (Signed)
History and Physical Interval Note:  12/22/2013 8:35 AM  Lynn Morris  has presented today for surgery, with the diagnosis of Nonspecific (abnormal) findings on radiological and other examination of gastrointestinal tract [793.4]  The various methods of treatment have been discussed with the patient and family. After consideration of risks, benefits and other options for treatment, the patient has consented to  Procedure(s): UPPER ENDOSCOPIC ULTRASOUND (EUS) LINEAR (N/A) as a surgical intervention .  The patient's history has been reviewed, patient examined, no change in status, stable for surgery.  I have reviewed the patient's chart and labs.  Questions were answered to the patient's satisfaction.     Milus Banister

## 2013-12-22 NOTE — H&P (View-Only) (Signed)
[facility] New Patient Consult   Referring MD: Omelia Marquart 73 y.o.  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 Sharlett Iles. She had a peptic stricture and underwent an esophageal dilatation in August of 2012. She reports increased solid dysphagia beginning in the summer of 2014. Reflux symptoms improved. The dysphasia progressed and she lost weight. She was referred to Dr. Henrene Pastor 11/16/2013.  She was taken an upper endoscopy 11/24/2013. The esophagus appeared normal. An infiltrating mass involved the proximal stomach including the cardia to the level of the GE junction. Multiple biopsies were obtained. Antrum appeared normal. The duodenum was normal. The pathology ([id number]) confirmed small foci of atypical individual cells. The findings worse highly suspicious for poorly differentiated carcinoma, but limited for definitive diagnosis. An H. pylori stain was negative.  She was referred for CTs of the chest, abdomen, and pelvis on 11/28/2013. No pathologically enlarged mediastinal or hilar lymph nodes. Asymmetric thickening of the distal esophagus. No suspicious pulmonary nodules or masses. No pleural effusion. Asymmetric thickening was noted along the lesser curvature of the stomach near the gastroesophageal junction. A 6 mm gastrohepatic ligament lymph node was nonspecific. No other lymphadenopathy. The liver and bilateral adrenal glands appeared unremarkable. No ascites.    Past Medical History  Diagnosis Date  . Esophageal reflux   . Hiatal hernia   . Esophageal stricture, status post esophageal dilatation   August 2012   . Depression   . Anxiety   . Hyperlipemia   . Allergy     SEASONAL  . Arthritis     LG TOE  . Cataract     BEGINNING RT.EYE  . Hx of colonic polyps     .   G2 P2  Past Surgical History  Procedure Laterality Date    . Abdominal hysterectomy  1978    Partial   . Cesarean section  1964 & 1966  . Nose surgery  1970  . Appendectomy      Medications: Reviewed  Allergies:  Allergies  Allergen Reactions  . Codeine     Family history: 2 brothers and 2 sisters. Her mother had cervical cancer in her 32s. No other family history of cancer.  Social History:   She lives with her husband outside of [place]. She is retired from an office occupation. She does not use tobacco or alcohol. No transfusion history. No risk factor for HIV or hepatitis.  History  Alcohol Use No    History  Smoking status  . Never Smoker   Smokeless tobacco  . Never Used      ROS:   Positives include: Solid dysphagia, 35 pound weight loss since the summer of 2014, occasional nausea and vomiting, anorexia  A complete ROS was otherwise negative.  Physical Exam:  Blood pressure 112/74, pulse 81, temperature 97.9 F (36.6 C), temperature source Oral, resp. rate 18, height 5' 0.5" (1.537 m), weight 120 lb 14.4 oz (54.84 kg).  HEENT: Oropharynx without visible mass, neck without mass Lungs: Clear bilaterally Cardiac: Regular rate and rhythm Abdomen: No hepatosplenomegaly, nontender, no mass  Vascular: No leg edema Lymph nodes: No cervical, supra-clavicular, right axillary, or inguinal nodes. Soft mobile 1/2 cm left axillary node Neurologic: Alert and oriented, the motor exam appears intact in the upper and lower extremities Skin: No rash Musculoskeletal: No spine   LAB:  CMP  Component Value Date/Time   NA 141 11/24/2013 0923   K 5.0 11/24/2013 0923   CL 108 11/24/2013 0923   CO2 28 11/24/2013 0923   GLUCOSE 90 11/24/2013 0923   BUN 20 11/24/2013 0923   CREATININE 0.8 11/24/2013 0923   CALCIUM 9.1 11/24/2013 0923   PROT 6.3 11/24/2013 0923   ALBUMIN 3.5 11/24/2013 0923   AST 23 11/24/2013 0923   ALT 17 11/24/2013 0923   ALKPHOS 47 11/24/2013 0923   BILITOT 0.8 11/24/2013 0923    Imaging:  I reviewed the 11/28/2013  abdomen CT with Ms. Santellan and her husband. 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 History of gastroesophageal reflux disease and peptic stricture, status post an esophageal dilatation procedure in August 2012 3. Anorexia/weight loss 4. Solid dysphagia secondary to #1 5. Anxiety   Disposition:   Ms. Mosey has been diagnosed with a proximal gastric mass. The mass involved the gastric cardia, upper fundus, and extends to the gastroesophageal junction. A biopsy is highly suspicious for poorly differentiated carcinoma. 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 I discussed treatment options with Ms. Sanville and her husband. We will refer her to Dr. Ardis Hughs for an endoscopic ultrasound to better characterize the extent of the tumor (involvement limited to the upper stomach?) And for a repeat biopsy.  If a diagnosis of upper gastric/GE junction carcinoma is confirmed I recommend neoadjuvant chemotherapy and radiation. This will be followed by surgical resection if the tumor is localized. She is scheduled for an appointment with Dr. Barry Dienes 12/19/2013. We will make a referral to Dr. Servando Snare as indicated. She is scheduled to see Dr. Lisbeth Renshaw 12/21/2013.  Ms. Dao will be referred for a staging PET scan.  I recommend weekly Taxol/carboplatin to be given concurrent with radiation. We discussed the potential toxicities associated with this chemotherapy regimen including the chance for nausea/vomiting, mucositis, diarrhea, alopecia, and hematologic toxicity. We discussed the neuropathy,  allergic reaction, and bone pain associated with Taxol. We discussed the potential for an allergic reaction to carboplatin. She agrees to proceed. She will attend a chemotherapy teaching class.  Her case will be presented at the GI tumor conference 12/21/2013.  Approximately 50 minutes were spent with patient today. The majority of the time was used for counseling and coordination of care.  Ladell Pier 12/09/2013, 3:49 PM

## 2013-12-22 NOTE — Anesthesia Postprocedure Evaluation (Signed)
  Anesthesia Post-op Note  Patient: Lynn Morris  Procedure(s) Performed: Procedure(s) (LRB): UPPER ENDOSCOPIC ULTRASOUND (EUS) LINEAR (N/A)  Patient Location: PACU  Anesthesia Type: MAC  Level of Consciousness: awake and alert   Airway and Oxygen Therapy: Patient Spontanous Breathing  Post-op Pain: mild  Post-op Assessment: Post-op Vital signs reviewed, Patient's Cardiovascular Status Stable, Respiratory Function Stable, Patent Airway and No signs of Nausea or vomiting  Last Vitals:  Filed Vitals:   12/22/13 1440  BP: 120/78  Pulse: 69  Temp: 36.5 C  Resp: 18    Post-op Vital Signs: stable   Complications: No apparent anesthesia complications

## 2013-12-22 NOTE — Transfer of Care (Signed)
Immediate Anesthesia Transfer of Care Note  Patient: Lynn Morris  Procedure(s) Performed: Procedure(s): UPPER ENDOSCOPIC ULTRASOUND (EUS) LINEAR (N/A)  Patient Location: PACU and Endoscopy Unit  Anesthesia Type:MAC  Level of Consciousness: awake, sedated and responds to stimulation  Airway & Oxygen Therapy: Patient Spontanous Breathing and Patient connected to nasal cannula oxygen  Post-op Assessment: Report given to PACU RN and Post -op Vital signs reviewed and stable  Post vital signs: Reviewed and stable  Complications: No apparent anesthesia complications

## 2013-12-22 NOTE — Op Note (Signed)
[facility] [place], [address]   ENDOSCOPIC ULTRASOUND PROCEDURE REPORT  PATIENT: Lynn Morris, Lynn Morris  MR#: [id number] BIRTHDATE: February 12, 1941  GENDER: Female ENDOSCOPIST: Milus Banister, MD REFERRED BY:  Arturo Morton, M.D. PROCEDURE DATE:  12/22/2013 PROCEDURE:   Upper EUS, EGD with biopsy ASA CLASS:      Class III INDICATIONS:   1.  gastric mass, EGD Dr.  Henrene Pastor 3-4 weeks ago showed highly atypical cells but not definitely cancer. MEDICATIONS: MAC sedation, administered by CRNA  DESCRIPTION OF PROCEDURE:   After the risks benefits and alternatives of the procedure were  explained, informed consent was obtained. The patient was then placed in the left, lateral, decubitus postion and IV sedation was administered. Throughout the procedure, the patients blood pressure, pulse and oxygen saturations were monitored continuously.  Under direct visualization, the Pentax EUS Radial [id number]  endoscope was introduced through the mouth  and advanced to the bulb of duodenum .  Water was used as necessary to provide an acoustic interface. Upon completion of the imaging, water was removed and the patient was sent to the recovery room in satisfactory condition.   Endoscopic findings: 1. The stomach was very poorly distensible  There is obvious tumor involving about 2/3 of the stomach, circumferentially.  The distal aspect of this obvious tumor is 8-9cm from the pyloris. The tumor was biopsies extensively, tunnel biopsied in several locations.  EUS findings: 1. There tumor described above correlated with variably thickened, hypoechoic gastric wall by EUS.  The most thickened gastric wall was in the proximal stomach were it was 1.2cm thick.  It appears that the proximal edge of this tumor is above 2cm above the GE junction and then it continues into the distal stomach (7-8cm from the pyloris).  Impression: Large tumor with distal edge 7-8cm from the pylorus and  proximal edge is about 2cm above the GE junction.  The tumor was extensively re-biopsied.   _______________________________ eSignedMilus Banister, MD 12/22/2013 2:52 PM

## 2013-12-22 NOTE — Anesthesia Preprocedure Evaluation (Addendum)
Anesthesia Evaluation  Patient identified by MRN, date of birth, ID band Patient awake    Reviewed: Allergy & Precautions, H&P , NPO status , Patient's Chart, lab work & pertinent test results  History of Anesthesia Complications (+) PONV  Airway Mallampati: II TM Distance: >3 FB Neck ROM: full    Dental  (+) Edentulous Upper, Dental Advisory Given   Pulmonary neg pulmonary ROS,  breath sounds clear to auscultation  Pulmonary exam normal       Cardiovascular Exercise Tolerance: Good negative cardio ROS  Rhythm:regular Rate:Normal     Neuro/Psych negative neurological ROS  negative psych ROS   GI/Hepatic negative GI ROS, Neg liver ROS, hiatal hernia, GERD-  Medicated and Controlled,Gastro-esophageal cancer   Endo/Other  negative endocrine ROS  Renal/GU negative Renal ROS  negative genitourinary   Musculoskeletal   Abdominal   Peds  Hematology negative hematology ROS (+)   Anesthesia Other Findings   Reproductive/Obstetrics negative OB ROS                          Anesthesia Physical Anesthesia Plan  ASA: III  Anesthesia Plan: MAC   Post-op Pain Management:    Induction:   Airway Management Planned:   Additional Equipment:   Intra-op Plan:   Post-operative Plan:   Informed Consent: I have reviewed the patients History and Physical, chart, labs and discussed the procedure including the risks, benefits and alternatives for the proposed anesthesia with the patient or authorized representative who has indicated his/her understanding and acceptance.   Dental Advisory Given  Plan Discussed with: CRNA and Surgeon  Anesthesia Plan Comments:         Anesthesia Quick Evaluation

## 2013-12-23 ENCOUNTER — Encounter: Payer: Self-pay | Admitting: Radiation Oncology

## 2013-12-23 ENCOUNTER — Encounter (HOSPITAL_COMMUNITY): Payer: Self-pay | Admitting: Gastroenterology

## 2013-12-23 ENCOUNTER — Ambulatory Visit (HOSPITAL_COMMUNITY)
Admission: RE | Admit: 2013-12-23 | Discharge: 2013-12-23 | Disposition: A | Discharge status: Home / Self Care | Payer: Medicare Other | Source: Ambulatory Visit | Attending: Oncology | Admitting: Oncology

## 2013-12-23 ENCOUNTER — Encounter (HOSPITAL_COMMUNITY)
Admission: RE | Admit: 2013-12-23 | Discharge: 2013-12-23 | Disposition: A | Discharge status: Home / Self Care | Payer: Medicare Other | Source: Ambulatory Visit | Attending: General Surgery | Admitting: General Surgery

## 2013-12-23 ENCOUNTER — Ambulatory Visit: Payer: Medicare Other | Admitting: Radiation Oncology

## 2013-12-23 DIAGNOSIS — C169 Malignant neoplasm of stomach, unspecified: Secondary | ICD-10-CM | POA: Insufficient documentation

## 2013-12-23 DIAGNOSIS — Z01818 Encounter for other preprocedural examination: Secondary | ICD-10-CM

## 2013-12-23 DIAGNOSIS — C160 Malignant neoplasm of cardia: Secondary | ICD-10-CM

## 2013-12-23 HISTORY — DX: Personal history of other medical treatment: Z92.89

## 2013-12-23 LAB — GLUCOSE, CAPILLARY: Glucose-Capillary: 92 mg/dL (ref 70–99)

## 2013-12-23 MED ORDER — FLUDEOXYGLUCOSE F - 18 (FDG) INJECTION
9.1000 | Freq: Once | INTRAVENOUS | Status: AC | PRN
  Administered 2013-12-23: 9.1 via INTRAVENOUS

## 2013-12-23 NOTE — Progress Notes (Signed)
Primary physician - dr. Annamarie Dawley andy Does not see cardiologist No prior cardiac testing

## 2013-12-23 NOTE — Assessment & Plan Note (Signed)
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 Her regular endoscopy did not appear quite as extensive. She is scheduled to undergo endoscopic ultrasound this week as well as a PET scan. These will determine what approach we take with her therapy. If she is found to have metastatic disease, she would proceed with palliative chemotherapy.  If she appears to have localized disease, we will determine whether the plan would be to do a complete abdominal approach or a combined thoracic and abdominal approach.  Even if we proceed with eventual surgery planned, she will need neoadjuvant chemotherapy.    She is encouraged to remain on proton pump inhibitors. She will see the dietitian. She is in very good shape.  I think she can tolerate surgery if that is an option, even if it requires total gastrectomy.    I will place her on the OR schedule for a port.  We discussed port a cath in terms of risks/benefits.  She wishes to proceed.   45 min spent in counseling/evaluation/examination/ and coordination of care.

## 2013-12-23 NOTE — Pre-Procedure Instructions (Signed)
Lynn Morris  12/23/2013   Your procedure is scheduled on:  Tuesday, June 9th  Report to [facility] Admitting at 0700 AM.  Call this number if you have problems the morning of surgery: [phone]   Remember:   Do not eat food or drink liquids after midnight.   Take these medicines the morning of surgery with A SIP OF WATER: none  Stop taking NSAIDS (ibuprofen, advil), OTC vitamins/herbal medications as of today.   Do not wear jewelry, make-up or nail polish.  Do not wear lotions, powders, or perfumes. You may wear deodorant.  Do not shave 48 hours prior to surgery. Men may shave face and neck.  Do not bring valuables to the hospital.  [facility] is not responsible   for any belongings or valuables.               Contacts, dentures or bridgework may not be worn into surgery.  Leave suitcase in the car. After surgery it may be brought to your room.  For patients admitted to the hospital, discharge time is determined by your treatment team.               Patients discharged the day of surgery will not be allowed to drive home.  Please read over the following fact sheets that you were given: Pain Booklet, Coughing and Deep Breathing and Surgical Site Infection Prevention [place] - Preparing for Surgery  Before surgery, you can play an important role.  Because skin is not sterile, your skin needs to be as free of germs as possible.  You can reduce the number of germs on you skin by washing with CHG (chlorahexidine gluconate) soap before surgery.  CHG is an antiseptic cleaner which kills germs and bonds with the skin to continue killing germs even after washing.  Please DO NOT use if you have an allergy to CHG or antibacterial soaps.  If your skin becomes reddened/irritated stop using the CHG and inform your nurse when you arrive at Short Stay.  Do not shave (including legs and underarms) for at least 48 hours prior to the first CHG shower.  You may shave your  face.  Please follow these instructions carefully:   1.  Shower with CHG Soap the night before surgery and the morning of Surgery.  2.  If you choose to wash your hair, wash your hair first as usual with your normal shampoo.  3.  After you shampoo, rinse your hair and body thoroughly to remove the shampoo.  4.  Use CHG as you would any other liquid soap.  You can apply CHG directly to the skin and wash gently with scrungie or a clean washcloth.  5.  Apply the CHG Soap to your body ONLY FROM THE NECK DOWN.  Do not use on open wounds or open sores.  Avoid contact with your eyes, ears, mouth and genitals (private parts).  Wash genitals (private parts) with your normal soap.  6.  Wash thoroughly, paying special attention to the area where your surgery will be performed.  7.  Thoroughly rinse your body with warm water from the neck down.  8.  DO NOT shower/wash with your normal soap after using and rinsing off the CHG Soap.  9.  Pat yourself dry with a clean towel.            10.  Wear clean pajamas.            11.  Place clean sheets on your bed the night of your first shower and do not sleep with pets.  Day of Surgery  Do not apply any lotions/deoderants the morning of surgery.  Please wear clean clothes to the hospital/surgery center.

## 2013-12-23 NOTE — Progress Notes (Signed)
Chief Complaint  Patient presents with  . New Evaluation    eval for gastric cancer     HISTORY: Patient is a 73 year old female who presented to her very carefully positioned with a 35, weight loss and dysphasia. She has had to have her esophagus dilated in the past for a benign stricture. She thought that was what was going on and put it off for around 10 months. During that time, she continued to lose weight and lost approximately 35 pounds without trying. She did not notice any blood in her stool or significant weakness. She did notice that she was getting tired more easily. She underwent EGD and was found to have a proximal gastric mass. She is here to ascertain if there are surgical options for her or not. She has seen Dr. Benay Spice. She denies pain. She has not fever or chills.  Past Medical History  Diagnosis Date  . Esophageal reflux   . Esophageal stricture   . Depression   . Anxiety   . Hyperlipemia   . Allergy     SEASONAL  . Arthritis     LG TOE  . Cataract     BEGINNING RT.EYE  . Hx of colonic polyps   . Complication of anesthesia   . PONV (postoperative nausea and vomiting)   . Hiatal hernia 12-13-13    intermittent nausea with vomiting  . Cancer 11/24/13    gastroesophageal carcinoma  . History of blood transfusion     Past Surgical History  Procedure Laterality Date  . Abdominal hysterectomy  1978    Partial   . Cesarean section  1964 & 1966  . Nose surgery  1970  . Appendectomy    . Abdominal hysterectomy    . Eus N/A 12/22/2013    Procedure: UPPER ENDOSCOPIC ULTRASOUND (EUS) LINEAR;  Surgeon: Milus Banister, MD;  Location: [facility];  Service: Endoscopy;  Laterality: N/A;    Current Outpatient Prescriptions  Medication Sig Dispense Refill  . Calcium Carbonate-Vit D-Min (CALCIUM 1200 PO) Take 2 tablets by mouth daily.       . Multiple Vitamin (MULTIVITAMIN WITH MINERALS) TABS tablet Take 1 tablet by mouth daily.      . sertraline (ZOLOFT) 100 MG tablet  Take 100 mg by mouth every other day.       . vitamin B-12 (CYANOCOBALAMIN) 500 MCG tablet Take 500 mcg by mouth daily.      Marland Kitchen dexamethasone (DECADRON) 2 MG tablet Take 10 mg by mouth See admin instructions. Take 5 tablets at 10pm the day before Chemo and take 5 tablet at 6 am the day of 1st Chemo.      Marland Kitchen ibuprofen (ADVIL,MOTRIN) 200 MG tablet Take 200 mg by mouth every 6 (six) hours as needed for moderate pain.       Marland Kitchen lidocaine-prilocaine (EMLA) cream Apply to port a cath site one hour prior to use. Cover with plastic. Do not rub in.  30 g  0  . prochlorperazine (COMPAZINE) 10 MG tablet Take 1 tablet (10 mg total) by mouth every 6 (six) hours as needed for nausea.  60 tablet  0   No current facility-administered medications for this visit.     Allergies  Allergen Reactions  . Codeine     Cant remember     Family History  Problem Relation Age of Onset  . Diabetes Mother   . Hypertension Mother   . Pancreatic cancer Mother   . Colon cancer Neg Hx  History   Social History  . Marital Status: Married    Spouse Name: N/A    Number of Children: 2  . Years of Education: N/A   Occupational History  . Retired    Social History Main Topics  . Smoking status: Never Smoker   . Smokeless tobacco: Never Used  . Alcohol Use: No  . Drug Use: No  . Sexual Activity: None   Other Topics Concern  . None   Social History Narrative   2 caffeine drinks daily      REVIEW OF SYSTEMS - PERTINENT POSITIVES ONLY: 12 point review of systems negative other than HPI and PMH except for nausea, trouble swallowing, weight loss.    EXAM: Filed Vitals:   12/19/13 1427  BP: 110/78  Pulse: 78  Temp: 97.8 F (36.6 C)  Resp: 16    Wt Readings from Last 3 Encounters:  12/23/13 120 lb 12.8 oz (54.795 kg)  12/22/13 121 lb (54.885 kg)  12/22/13 121 lb (54.885 kg)     Gen:  No acute distress.  Well nourished and well groomed.   Neurological: Alert and oriented to person, place, and  time. Coordination normal.  Head: Normocephalic and atraumatic.  Eyes: Conjunctivae are normal. Pupils are equal, round, and reactive to light. No scleral icterus.  Neck: Normal range of motion. Neck supple. No tracheal deviation or thyromegaly present.  Cardiovascular: Normal rate, regular rhythm, normal heart sounds and intact distal pulses.  Exam reveals no gallop and no friction rub.  No murmur heard. Respiratory: Effort normal.  No respiratory distress. No chest wall tenderness. Breath sounds normal.  No wheezes, rales or rhonchi.  GI: Soft. Bowel sounds are normal. The abdomen is soft and nontender.  There is no rebound and no guarding.  Musculoskeletal: Normal range of motion. Extremities are nontender.  Lymphadenopathy: No cervical, preauricular, postauricular or axillary adenopathy is present Skin: Skin is warm and dry. No rash noted. No diaphoresis. No erythema. No pallor. No clubbing, cyanosis, or edema.   Psychiatric: Normal mood and affect. Behavior is normal. Judgment and thought content normal.    LABORATORY RESULTS: Available labs are reviewed   Recent Results (from the past 2160 hour(s))  COMPREHENSIVE METABOLIC PANEL     Status: None   Collection Time    11/24/13  9:23 AM      Result Value Ref Range   Sodium 141  135 - 145 mEq/L   Potassium 5.0  3.5 - 5.1 mEq/L   Chloride 108  96 - 112 mEq/L   CO2 28  19 - 32 mEq/L   Glucose, Bld 90  70 - 99 mg/dL   BUN 20  6 - 23 mg/dL   Creatinine, Ser 0.8  0.4 - 1.2 mg/dL   Total Bilirubin 0.8  0.2 - 1.2 mg/dL   Alkaline Phosphatase 47  39 - 117 U/L   AST 23  0 - 37 U/L   ALT 17  0 - 35 U/L   Total Protein 6.3  6.0 - 8.3 g/dL   Albumin 3.5  3.5 - 5.2 g/dL   Calcium 9.1  8.4 - 10.5 mg/dL   GFR 70.98  >60.00 mL/min  CBC WITH DIFFERENTIAL     Status: None   Collection Time    12/20/13 11:27 AM      Result Value Ref Range   WBC 5.3  3.9 - 10.3 10e3/uL   NEUT# 3.1  1.5 - 6.5 10e3/uL   HGB 13.2  11.6 -  15.9 g/dL   HCT 39.5   34.8 - 46.6 %   Platelets 249  145 - 400 10e3/uL   MCV 86.5  79.5 - 101.0 fL   MCH 29.0  25.1 - 34.0 pg   MCHC 33.5  31.5 - 36.0 g/dL   RBC 4.57  3.70 - 5.45 10e6/uL   RDW 12.8  11.2 - 14.5 %   lymph# 1.5  0.9 - 3.3 10e3/uL   MONO# 0.5  0.1 - 0.9 10e3/uL   Eosinophils Absolute 0.1  0.0 - 0.5 10e3/uL   Basophils Absolute 0.0  0.0 - 0.1 10e3/uL   NEUT% 59.1  38.4 - 76.8 %   LYMPH% 28.3  14.0 - 49.7 %   MONO% 9.3  0.0 - 14.0 %   EOS% 2.7  0.0 - 7.0 %   BASO% 0.6  0.0 - 2.0 %  CEA     Status: None   Collection Time    12/20/13 11:28 AM      Result Value Ref Range   CEA 0.7  0.0 - 5.0 ng/mL  COMPREHENSIVE METABOLIC PANEL (ZO10)     Status: Abnormal   Collection Time    12/20/13 11:28 AM      Result Value Ref Range   Sodium 142  136 - 145 mEq/L   Potassium 4.1  3.5 - 5.1 mEq/L   Chloride 107  98 - 109 mEq/L   CO2 23  22 - 29 mEq/L   Glucose 82  70 - 140 mg/dl   BUN 16.3  7.0 - 26.0 mg/dL   Creatinine 0.8  0.6 - 1.1 mg/dL   Total Bilirubin 0.54  0.20 - 1.20 mg/dL   Alkaline Phosphatase 50  40 - 150 U/L   AST 19  5 - 34 U/L   ALT 11  0 - 55 U/L   Total Protein 6.3 (*) 6.4 - 8.3 g/dL   Albumin 3.3 (*) 3.5 - 5.0 g/dL   Calcium 9.1  8.4 - 10.4 mg/dL   Anion Gap 11  3 - 11 mEq/L  GLUCOSE, CAPILLARY     Status: None   Collection Time    12/23/13 11:24 AM      Result Value Ref Range   Glucose-Capillary 92  70 - 99 mg/dL     RADIOLOGY RESULTS: See E-Chart or I-Site for most recent results.  Images and reports are reviewed.  Ct Chest W Contrast  11/28/2013   CLINICAL DATA:  Proximal gastric mass.  EXAM: CT CHEST, ABDOMEN, AND PELVIS WITH CONTRAST  TECHNIQUE: Multidetector CT imaging of the chest, abdomen and pelvis was performed following the standard protocol during bolus administration of intravenous contrast.  CONTRAST:  71mL OMNIPAQUE IOHEXOL 300 MG/ML  SOLN  COMPARISON:  No priors.  FINDINGS: CT CHEST FINDINGS  Mediastinum: Heart size is normal. There is no significant  pericardial fluid, thickening or pericardial calcification. There is atherosclerosis of the thoracic aorta, the great vessels of the mediastinum and the coronary arteries, including calcified atherosclerotic plaque in the left anterior descending coronary arteries. No pathologically enlarged mediastinal or hilar lymph nodes. Small hiatal hernia with some asymmetric thickening of the distal esophagus posteriorly.  Lungs/Pleura: No suspicious appearing pulmonary nodules or masses. No acute consolidative airspace disease. No pleural effusions.  Musculoskeletal: There are no aggressive appearing lytic or blastic lesions noted in the visualized portions of the skeleton.  CT ABDOMEN AND PELVIS FINDINGS  Abdomen/Pelvis: Subtle asymmetric thickening is noted along the a lesser curvature of the stomach,  particularly near the gastroesophageal junction involving portions of the fundus and cardia. 6 mm gastrohepatic ligament lymph node is nonspecific. No other definite lymphadenopathy is noted in the upper abdomen.  The appearance of the liver, gallbladder, pancreas, spleen, bilateral adrenal glands and bilateral kidneys is unremarkable. No significant volume of ascites. No pneumoperitoneum. No pathologic distention of small bowel. Several colonic diverticulae are noted, particularly in the region of the sigmoid colon, without surrounding inflammatory changes to suggest an acute diverticulitis at this time. Status post hysterectomy. Ovaries are not confidently identified may be surgically absent or atrophic.  Musculoskeletal: There are no aggressive appearing lytic or blastic lesions noted in the visualized portions of the skeleton.  IMPRESSION: 1. Asymmetric soft tissue thickening involving predominantly the fundus and cardia of the stomach, concerning for potential gastric neoplasm. There is also some mild asymmetric soft tissue thickening involving the posterior aspect of the distal esophagus. 2. No definite signs of  metastatic disease in the chest, abdomen or pelvis. 3. Additional incidental findings, as above.   Electronically Signed   By: Vinnie Langton M.D.   On: 11/28/2013 12:42   Ct Abdomen Pelvis W Contrast  11/28/2013   CLINICAL DATA:  Proximal gastric mass.  EXAM: CT CHEST, ABDOMEN, AND PELVIS WITH CONTRAST  TECHNIQUE: Multidetector CT imaging of the chest, abdomen and pelvis was performed following the standard protocol during bolus administration of intravenous contrast.  CONTRAST:  43mL OMNIPAQUE IOHEXOL 300 MG/ML  SOLN  COMPARISON:  No priors.  FINDINGS: CT CHEST FINDINGS  Mediastinum: Heart size is normal. There is no significant pericardial fluid, thickening or pericardial calcification. There is atherosclerosis of the thoracic aorta, the great vessels of the mediastinum and the coronary arteries, including calcified atherosclerotic plaque in the left anterior descending coronary arteries. No pathologically enlarged mediastinal or hilar lymph nodes. Small hiatal hernia with some asymmetric thickening of the distal esophagus posteriorly.  Lungs/Pleura: No suspicious appearing pulmonary nodules or masses. No acute consolidative airspace disease. No pleural effusions.  Musculoskeletal: There are no aggressive appearing lytic or blastic lesions noted in the visualized portions of the skeleton.  CT ABDOMEN AND PELVIS FINDINGS  Abdomen/Pelvis: Subtle asymmetric thickening is noted along the a lesser curvature of the stomach, particularly near the gastroesophageal junction involving portions of the fundus and cardia. 6 mm gastrohepatic ligament lymph node is nonspecific. No other definite lymphadenopathy is noted in the upper abdomen.  The appearance of the liver, gallbladder, pancreas, spleen, bilateral adrenal glands and bilateral kidneys is unremarkable. No significant volume of ascites. No pneumoperitoneum. No pathologic distention of small bowel. Several colonic diverticulae are noted, particularly in the  region of the sigmoid colon, without surrounding inflammatory changes to suggest an acute diverticulitis at this time. Status post hysterectomy. Ovaries are not confidently identified may be surgically absent or atrophic.  Musculoskeletal: There are no aggressive appearing lytic or blastic lesions noted in the visualized portions of the skeleton.  IMPRESSION: 1. Asymmetric soft tissue thickening involving predominantly the fundus and cardia of the stomach, concerning for potential gastric neoplasm. There is also some mild asymmetric soft tissue thickening involving the posterior aspect of the distal esophagus. 2. No definite signs of metastatic disease in the chest, abdomen or pelvis. 3. Additional incidental findings, as above.   Electronically Signed   By: Vinnie Langton M.D.   On: 11/28/2013 12:42   Nm Pet Image Initial (pi) Skull Base To Thigh  12/23/2013   CLINICAL DATA:  Initial treatment strategy for gastric cancer.  EXAM:  NUCLEAR MEDICINE PET SKULL BASE TO THIGH  TECHNIQUE: 9.1 mCi F-18 FDG was injected intravenously. Full-ring PET imaging was performed from the skull base to thigh after the radiotracer. CT data was obtained and used for attenuation correction and anatomic localization.  FASTING BLOOD GLUCOSE:  Value: 92. Mg/dl  COMPARISON:  None.  FINDINGS: NECK  No hypermetabolic lymph nodes in the neck.  CHEST  Mild nonspecific increased uptake is associated with bilateral hilar regions. SUV max is equal to 4.2 within the right hilar region and 3.4 in the left hilar region. No significant adenopathy identified on corresponding CT images. No suspicious pulmonary nodules on the CT scan.  ABDOMEN/PELVIS  Mild FDG uptake is associated with the mild diffuse wall thickening involving the stomach. In the area of the gastric cardia the SUV max is equal to 2.6. Along the greater curvature of the stomach the SUV max is equal to 2.8. No significant FDG uptake is associated with the gastrohepatic ligament lymph  node. No abnormal hypermetabolic activity within the liver, pancreas, adrenal glands, or spleen. No hypermetabolic lymph nodes in the abdomen or pelvis.  SKELETON  No focal hypermetabolic activity to suggest skeletal metastasis.  IMPRESSION: 1. There is mild diffuse increased uptake associated with the distal esophagus and wall of stomach. The SUV max associated with the wall of stomach measures up to 2.6. 2. No hypermetabolic adenopathy or evidence of distant metastatic disease from patient's biopsy proven gastric cancer. 3. Mild increased uptake associated with bilateral hilar regions without adenopathy on corresponding CT images.   Electronically Signed   By: Kerby Moors M.D.   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 Her regular endoscopy did not appear quite as extensive. She is scheduled to undergo endoscopic ultrasound this week as well as a PET scan. These will determine what approach we take with her therapy. If she is found to have metastatic disease, she would proceed with palliative chemotherapy.  If she appears to have localized disease, we will determine whether the plan would be to do a complete abdominal approach or a combined thoracic and abdominal approach.  Even if we proceed with eventual surgery planned, she will need neoadjuvant chemotherapy.    She is encouraged to remain on proton pump inhibitors. She will see the dietitian. She is in very good shape.  I think she can tolerate surgery if that is an option, even if it requires total gastrectomy.    I will place her on the OR schedule for a port.  We discussed port a cath in terms of risks/benefits.  She wishes to proceed.   45 min spent in counseling/evaluation/examination/ and coordination of care.       Milus Height MD [facility]      Visit Diagnoses: 1. Gastric  cancer     Primary Care Physician: Leamon Arnt, MD   Genelle Gather

## 2013-12-25 NOTE — Addendum Note (Signed)
Encounter addended by: Marye Round, MD on: 12/25/2013  9:15 AM<BR>     Documentation filed: Visit Diagnoses, Notes Section, Problem List, Follow-up Section, LOS Section

## 2013-12-25 NOTE — Progress Notes (Signed)
I have discussed with the patient her ultrasound result and also have had a chance to discuss her case with Dr. Benay Spice in more detail. We saw her current findings, we decided to hold off on her scheduled simulation. She will proceed with a PET scan today. The tumor appears to more represent a primary gastric cancer. Primary options would often include proceeding to surgery initially versus neoadjuvant chemotherapy. I discussed with the patient that we would hold off on radiation treatment at this time but this issue could be revisited based on her final pathology report.

## 2013-12-25 NOTE — Progress Notes (Signed)
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   HISTORY OF PRESENT ILLNESS::Lynn Morris is a 73 y.o. female who is seen for an initial consultation visit. The patient indicates that she has had a long history of gastroesophageal reflux disease. She has undergone esophageal dilatation in the past a couple of times for stricture. The patient's dysphagia has improved with each of these procedures and she has been scheduled to be seen regarding this on a periodic basis. She notes that she began experiencing some worsening dysphagia in recent months. This was associated with significant weight loss which really had not been the case before.  The patient therefore was taken for upper endoscopy on 11/24/2013. An infiltrating mass was seen within the gastroesophageal junction region. This involved the proximal stomach including the cardia and extended to the GE junction. Multiple biopsies were obtained. The pathology revealed some atypical cells but no definitive malignancy was seen.  The patient has undergone CT scans of the chest abdomen and pelvis. Asymmetric thickening was seen in the distal esophagus associated with some thickening involving the lesser curvature of the stomach near the gastroesophageal junction. A 6 mm gastrohepatic ligament lymph node was nonspecific. No other lymphadenopathy seen. No other evidence of distant disease on this scan.  The patient has been scheduled for an endoscopic ultrasound tomorrow with repeat biopsy. She also has been scheduled for a PET scan on Friday for further staging.  At this time, the patient estimates that she has lost approximately 30 pounds. She describes some pain after swallowing.   PREVIOUS  RADIATION THERAPY: No   PAST MEDICAL HISTORY:  has a past medical history of Esophageal reflux; Esophageal stricture; Depression; Anxiety; Hyperlipemia; Allergy; Arthritis; Cataract; colonic polyps; Complication of anesthesia; PONV (postoperative nausea and vomiting); Hiatal hernia (12-13-13); Cancer (11/24/13); and History of blood transfusion.     PAST SURGICAL HISTORY: Past Surgical History  Procedure Laterality Date  . Abdominal hysterectomy  1978    Partial   . Cesarean section  1964 & 1966  . Nose surgery  1970  . Appendectomy    . Abdominal hysterectomy    . Eus N/A 12/22/2013    Procedure: UPPER ENDOSCOPIC ULTRASOUND (EUS) LINEAR;  Surgeon: Milus Banister, MD;  Location: [facility];  Service: Endoscopy;  Laterality: N/A;     FAMILY HISTORY: family history includes Diabetes in her mother; Hypertension in her mother; Pancreatic cancer in her mother. There is no history of Colon cancer.   SOCIAL HISTORY:  reports that she has never smoked. She has never used smokeless tobacco. She reports that she does not drink alcohol or use illicit drugs.   ALLERGIES: Codeine   MEDICATIONS:  Current Outpatient Prescriptions  Medication Sig Dispense Refill  . Calcium Carbonate-Vit D-Min (CALCIUM 1200 PO) Take 2 tablets by mouth daily.       Marland Kitchen ibuprofen (ADVIL,MOTRIN) 200 MG tablet Take 200 mg by mouth every 6 (six) hours as needed for moderate pain.       . Multiple Vitamin (MULTIVITAMIN WITH MINERALS) TABS tablet Take 1 tablet by mouth daily.      . sertraline (ZOLOFT) 100 MG tablet Take 100 mg by mouth every other day.       Marland Kitchen  vitamin B-12 (CYANOCOBALAMIN) 500 MCG tablet Take 500 mcg by mouth daily.      Marland Kitchen dexamethasone (DECADRON) 2 MG tablet Take 10 mg by mouth See admin instructions. Take 5 tablets at 10pm the day before Chemo and take 5 tablet at 6 am the day of 1st Chemo.      . lidocaine-prilocaine (EMLA) cream Apply to port a cath site one hour prior to use. Cover with plastic. Do  not rub in.  30 g  0  . prochlorperazine (COMPAZINE) 10 MG tablet Take 1 tablet (10 mg total) by mouth every 6 (six) hours as needed for nausea.  60 tablet  0   No current facility-administered medications for this encounter.     REVIEW OF SYSTEMS:  A 15 point review of systems is documented in the electronic medical record. This was obtained by the nursing staff. However, I reviewed this with the patient to discuss relevant findings and make appropriate changes.  Pertinent items are noted in HPI.    PHYSICAL EXAM:  height is 5' (1.524 m) and weight is 123 lb 3.2 oz (55.883 kg). Her oral temperature is 97.7 F (36.5 C). Her blood pressure is 112/76 and her pulse is 77. Her respiration is 20.   ECOG = 1  0 - Asymptomatic (Fully active, able to carry on all predisease activities without restriction)  1 - Symptomatic but completely ambulatory (Restricted in physically strenuous activity but ambulatory and able to carry out work of a light or sedentary nature. For example, light housework, office work)  2 - Symptomatic, <50% in bed during the day (Ambulatory and capable of all self care but unable to carry out any work activities. Up and about more than 50% of waking hours)  3 - Symptomatic, >50% in bed, but not bedbound (Capable of only limited self-care, confined to bed or chair 50% or more of waking hours)  4 - Bedbound (Completely disabled. Cannot carry on any self-care. Totally confined to bed or chair)  5 - Death   Eustace Pen MM, Creech RH, Tormey DC, et al. [phone]). "Toxicity and response criteria of the [facility]". [place] Oncol. 5 (6): 649-55  General: Well-developed, in no acute distress HEENT: Normocephalic, atraumatic; oral cavity clear Neck: Supple without any lymphadenopathy Cardiovascular: Regular rate and rhythm Respiratory: Clear to auscultation bilaterally GI: Soft, nontender, normal bowel sounds Extremities: No edema present Neuro: No focal  deficits     LABORATORY DATA:  Lab Results  Component Value Date   WBC 5.3 12/20/2013   HGB 13.2 12/20/2013   HCT 39.5 12/20/2013   MCV 86.5 12/20/2013   PLT 249 12/20/2013   Lab Results  Component Value Date   NA 142 12/20/2013   K 4.1 12/20/2013   CL 108 11/24/2013   CO2 23 12/20/2013   Lab Results  Component Value Date   ALT 11 12/20/2013   AST 19 12/20/2013   ALKPHOS 50 12/20/2013   BILITOT 0.54 12/20/2013      RADIOGRAPHY: Ct Chest W Contrast  11/28/2013   CLINICAL DATA:  Proximal gastric mass.  EXAM: CT CHEST, ABDOMEN, AND PELVIS WITH CONTRAST  TECHNIQUE: Multidetector CT imaging of the chest, abdomen and pelvis was performed following the standard protocol during bolus administration of intravenous contrast.  CONTRAST:  54mL OMNIPAQUE IOHEXOL 300 MG/ML  SOLN  COMPARISON:  No priors.  FINDINGS: CT CHEST FINDINGS  Mediastinum: Heart size is normal. There is no significant pericardial fluid, thickening or pericardial calcification.  There is atherosclerosis of the thoracic aorta, the great vessels of the mediastinum and the coronary arteries, including calcified atherosclerotic plaque in the left anterior descending coronary arteries. No pathologically enlarged mediastinal or hilar lymph nodes. Small hiatal hernia with some asymmetric thickening of the distal esophagus posteriorly.  Lungs/Pleura: No suspicious appearing pulmonary nodules or masses. No acute consolidative airspace disease. No pleural effusions.  Musculoskeletal: There are no aggressive appearing lytic or blastic lesions noted in the visualized portions of the skeleton.  CT ABDOMEN AND PELVIS FINDINGS  Abdomen/Pelvis: Subtle asymmetric thickening is noted along the a lesser curvature of the stomach, particularly near the gastroesophageal junction involving portions of the fundus and cardia. 6 mm gastrohepatic ligament lymph node is nonspecific. No other definite lymphadenopathy is noted in the upper abdomen.  The appearance of the liver,  gallbladder, pancreas, spleen, bilateral adrenal glands and bilateral kidneys is unremarkable. No significant volume of ascites. No pneumoperitoneum. No pathologic distention of small bowel. Several colonic diverticulae are noted, particularly in the region of the sigmoid colon, without surrounding inflammatory changes to suggest an acute diverticulitis at this time. Status post hysterectomy. Ovaries are not confidently identified may be surgically absent or atrophic.  Musculoskeletal: There are no aggressive appearing lytic or blastic lesions noted in the visualized portions of the skeleton.  IMPRESSION: 1. Asymmetric soft tissue thickening involving predominantly the fundus and cardia of the stomach, concerning for potential gastric neoplasm. There is also some mild asymmetric soft tissue thickening involving the posterior aspect of the distal esophagus. 2. No definite signs of metastatic disease in the chest, abdomen or pelvis. 3. Additional incidental findings, as above.   Electronically Signed   By: Vinnie Langton M.D.   On: 11/28/2013 12:42   Ct Abdomen Pelvis W Contrast  11/28/2013   CLINICAL DATA:  Proximal gastric mass.  EXAM: CT CHEST, ABDOMEN, AND PELVIS WITH CONTRAST  TECHNIQUE: Multidetector CT imaging of the chest, abdomen and pelvis was performed following the standard protocol during bolus administration of intravenous contrast.  CONTRAST:  71mL OMNIPAQUE IOHEXOL 300 MG/ML  SOLN  COMPARISON:  No priors.  FINDINGS: CT CHEST FINDINGS  Mediastinum: Heart size is normal. There is no significant pericardial fluid, thickening or pericardial calcification. There is atherosclerosis of the thoracic aorta, the great vessels of the mediastinum and the coronary arteries, including calcified atherosclerotic plaque in the left anterior descending coronary arteries. No pathologically enlarged mediastinal or hilar lymph nodes. Small hiatal hernia with some asymmetric thickening of the distal esophagus  posteriorly.  Lungs/Pleura: No suspicious appearing pulmonary nodules or masses. No acute consolidative airspace disease. No pleural effusions.  Musculoskeletal: There are no aggressive appearing lytic or blastic lesions noted in the visualized portions of the skeleton.  CT ABDOMEN AND PELVIS FINDINGS  Abdomen/Pelvis: Subtle asymmetric thickening is noted along the a lesser curvature of the stomach, particularly near the gastroesophageal junction involving portions of the fundus and cardia. 6 mm gastrohepatic ligament lymph node is nonspecific. No other definite lymphadenopathy is noted in the upper abdomen.  The appearance of the liver, gallbladder, pancreas, spleen, bilateral adrenal glands and bilateral kidneys is unremarkable. No significant volume of ascites. No pneumoperitoneum. No pathologic distention of small bowel. Several colonic diverticulae are noted, particularly in the region of the sigmoid colon, without surrounding inflammatory changes to suggest an acute diverticulitis at this time. Status post hysterectomy. Ovaries are not confidently identified may be surgically absent or atrophic.  Musculoskeletal: There are no aggressive appearing lytic or blastic lesions noted in  the visualized portions of the skeleton.  IMPRESSION: 1. Asymmetric soft tissue thickening involving predominantly the fundus and cardia of the stomach, concerning for potential gastric neoplasm. There is also some mild asymmetric soft tissue thickening involving the posterior aspect of the distal esophagus. 2. No definite signs of metastatic disease in the chest, abdomen or pelvis. 3. Additional incidental findings, as above.   Electronically Signed   By: Vinnie Langton M.D.   On: 11/28/2013 12:42   Nm Pet Image Initial (pi) Skull Base To Thigh  12/23/2013   CLINICAL DATA:  Initial treatment strategy for gastric cancer.  EXAM: NUCLEAR MEDICINE PET SKULL BASE TO THIGH  TECHNIQUE: 9.1 mCi F-18 FDG was injected intravenously.  Full-ring PET imaging was performed from the skull base to thigh after the radiotracer. CT data was obtained and used for attenuation correction and anatomic localization.  FASTING BLOOD GLUCOSE:  Value: 92. Mg/dl  COMPARISON:  None.  FINDINGS: NECK  No hypermetabolic lymph nodes in the neck.  CHEST  Mild nonspecific increased uptake is associated with bilateral hilar regions. SUV max is equal to 4.2 within the right hilar region and 3.4 in the left hilar region. No significant adenopathy identified on corresponding CT images. No suspicious pulmonary nodules on the CT scan.  ABDOMEN/PELVIS  Mild FDG uptake is associated with the mild diffuse wall thickening involving the stomach. In the area of the gastric cardia the SUV max is equal to 2.6. Along the greater curvature of the stomach the SUV max is equal to 2.8. No significant FDG uptake is associated with the gastrohepatic ligament lymph node. No abnormal hypermetabolic activity within the liver, pancreas, adrenal glands, or spleen. No hypermetabolic lymph nodes in the abdomen or pelvis.  SKELETON  No focal hypermetabolic activity to suggest skeletal metastasis.  IMPRESSION: 1. There is mild diffuse increased uptake associated with the distal esophagus and wall of stomach. The SUV max associated with the wall of stomach measures up to 2.6. 2. No hypermetabolic adenopathy or evidence of distant metastatic disease from patient's biopsy proven gastric cancer. 3. Mild increased uptake associated with bilateral hilar regions without adenopathy on corresponding CT images.   Electronically Signed   By: Kerby Moors M.D.   On: 12/23/2013 14:09       IMPRESSION: The patient has a new diagnosis of cancer in the region of the GE junction based on clinical grounds. The patient is proceeding with an endoscopic ultrasound with repeat biopsy tomorrow. No clear malignancy seen on original biopsy although atypical cells were seen. The patient also will undergo a PET  scan.  The tumor appears to be present in the proximal stomach and extends to the GE junction. I discussed possible treatment options with her which could include try modality therapy. For esophageal tumors and tumors involving the GE junction, chemoradiation treatment often is given preoperatively. I discussed this option with her versus other options including proceeding with surgery directly. I discussed the possible role of radiation treatment which would involve 5-1/2 weeks of radiation treatment with concurrent chemotherapy. We discussed the possible benefit in terms of local/regional control. We also discussed the possible side effects and risks of treatment. All of her questions were answered.   PLAN: The patient will tentatively be scheduled for a simulation later this week. We will gather additional information and on the basis of her ultrasound we will decide how to proceed accordingly. This and the PET scan will allow her staging to be completed.  ________________________________   Jodelle Gross, MD, PhD   **Disclaimer: This note was dictated with voice recognition software. Similar sounding words can inadvertently be transcribed and this note may contain transcription errors which may not have been corrected upon publication of note.**

## 2013-12-26 ENCOUNTER — Telehealth: Payer: Self-pay | Admitting: Oncology

## 2013-12-26 ENCOUNTER — Ambulatory Visit (HOSPITAL_BASED_OUTPATIENT_CLINIC_OR_DEPARTMENT_OTHER): Payer: Medicare Other | Admitting: Oncology

## 2013-12-26 ENCOUNTER — Institutional Professional Consult (permissible substitution) (INDEPENDENT_AMBULATORY_CARE_PROVIDER_SITE_OTHER): Payer: Medicare Other | Admitting: Cardiothoracic Surgery

## 2013-12-26 ENCOUNTER — Encounter: Payer: Self-pay | Admitting: Cardiothoracic Surgery

## 2013-12-26 ENCOUNTER — Telehealth: Payer: Self-pay | Admitting: *Deleted

## 2013-12-26 VITALS — BP 109/71 | HR 74 | Temp 98.5°F | Resp 19 | Ht 60.5 in | Wt 120.8 lb

## 2013-12-26 VITALS — BP 113/71 | HR 73 | Resp 16 | Ht 60.5 in | Wt 120.0 lb

## 2013-12-26 DIAGNOSIS — C169 Malignant neoplasm of stomach, unspecified: Secondary | ICD-10-CM

## 2013-12-26 DIAGNOSIS — F411 Generalized anxiety disorder: Secondary | ICD-10-CM

## 2013-12-26 DIAGNOSIS — R21 Rash and other nonspecific skin eruption: Secondary | ICD-10-CM

## 2013-12-26 DIAGNOSIS — R630 Anorexia: Secondary | ICD-10-CM

## 2013-12-26 DIAGNOSIS — R1310 Dysphagia, unspecified: Secondary | ICD-10-CM

## 2013-12-26 NOTE — Progress Notes (Signed)
[address]       [place] [address]             [id number]                    Lekisha L Derstine [place] Medical Record #[id number] Date of Birth: Nov 29, 1940  Referring: Ladell Pier, MD Primary Care: Leamon Arnt, MD  Chief Complaint:    Chief Complaint  Patient presents with  . Gastrophageal Reflux    GASTRIC CANCER....Marland KitchenEVAL .Marland KitchenCT CHEST/A/P.Marland KitchenMarland KitchenPET    History of Present Illness:    Lynn Morris 72 y.o. 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 The patient has a long history of acid reflux and esophageal strictures in the past which have been dilated. In late summer 2014 she had some trouble swallowing and has noticed a 35 pound weight loss over the last 6-8 months. 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 She is currently seeing medical oncology and general surgery. She comes today at the referral of Dr. Benay Spice to consider if she would be a candidate for combined esophagogastrectomy. She currently has pain with swallowing but is able to take liquids and solids, gets full very quickly. She has met with a dietitian. She has no significant other underlying medical problems.    Current Activity/ Functional Status:  Patient is independent with mobility/ambulation, transfers, ADL's, IADL's.   Zubrod Score: At the time of surgery this patient's most appropriate activity status/level should be described as: [x]     0    Normal activity, no symptoms []     1    Restricted in physical strenuous activity but ambulatory, able to do out light work []     2    Ambulatory and capable of self care, unable to do work activities, up and about               >50 % of waking hours                              []     3    Only limited self care, in bed greater than 50% of  waking hours []     4    Completely disabled, no self care, confined to bed or chair []     5    Moribund   Past Medical History  Diagnosis Date  . Esophageal reflux   . Esophageal stricture   . Depression   . Anxiety   . Hyperlipemia   . Allergy     SEASONAL  . Arthritis     LG TOE  . Cataract     BEGINNING RT.EYE  . Hx of colonic polyps   . Complication of anesthesia   . PONV (postoperative nausea and vomiting)   . Hiatal hernia 12-13-13    intermittent nausea with vomiting  . Cancer 11/24/13    gastroesophageal carcinoma  . History of blood transfusion     Past Surgical History  Procedure Laterality Date  . Abdominal hysterectomy  1978    Partial   . Cesarean section  1964 & 1966  . Nose surgery  1970  . Appendectomy    . Abdominal hysterectomy    . Eus N/A 12/22/2013  Procedure: UPPER ENDOSCOPIC ULTRASOUND (EUS) LINEAR;  Surgeon: Milus Banister, MD;  Location: [facility];  Service: Endoscopy;  Laterality: N/A;    Family History  Problem Relation Age of Onset  . Diabetes Mother   . Hypertension Mother   . Pancreatic cancer Mother   . Colon cancer Neg Hx     History   Social History  . Marital Status: Married    Spouse Name: N/A    Number of Children: 2  . Years of Education: N/A   Occupational History  . Retired    Social History Main Topics  . Smoking status: Never Smoker   . Smokeless tobacco: Never Used  . Alcohol Use: No  . Drug Use: No  .      Social History Narrative   2 caffeine drinks daily     History  Smoking status  . Never Smoker   Smokeless tobacco  . Never Used    History  Alcohol Use No     Allergies  Allergen Reactions  . Codeine     Cant remember    Current Outpatient Prescriptions  Medication Sig Dispense Refill  . Calcium Carbonate-Vit D-Min (CALCIUM 1200 PO) Take 2 tablets by mouth daily.       Marland Kitchen dexamethasone (DECADRON) 2 MG tablet Take 10 mg by mouth See admin instructions. Take 5 tablets at 10pm the day  before Chemo and take 5 tablet at 6 am the day of 1st Chemo.      Marland Kitchen ibuprofen (ADVIL,MOTRIN) 200 MG tablet Take 200 mg by mouth every 6 (six) hours as needed for moderate pain.       Marland Kitchen lidocaine-prilocaine (EMLA) cream Apply to port a cath site one hour prior to use. Cover with plastic. Do not rub in.  30 g  0  . Multiple Vitamin (MULTIVITAMIN WITH MINERALS) TABS tablet Take 1 tablet by mouth daily.      . prochlorperazine (COMPAZINE) 10 MG tablet Take 1 tablet (10 mg total) by mouth every 6 (six) hours as needed for nausea.  60 tablet  0  . sertraline (ZOLOFT) 100 MG tablet Take 100 mg by mouth every other day.       . vitamin B-12 (CYANOCOBALAMIN) 500 MCG tablet Take 500 mcg by mouth daily.       No current facility-administered medications for this visit.     Review of Systems:     Cardiac Review of Systems: Y or N  Chest Pain [  n  ]  Resting SOB [  n ] Exertional SOB  [n  ]  Orthopnea [n  ]   Pedal Edema Florencio.Farrier   ]    Palpitations Florencio.Farrier  ] Syncope  [ n ]   Presyncope [  n ]  General Review of Systems: [Y] = yes [  ]=no Constitional: recent weight change [y 36 lbs over 6 months  ];  Wt loss over the last 3 months [   ] anorexia Blue.Reese  ]; fatigue [ n ]; nausea [n ]; night sweats [ n ]; fever [n  ]; or chills [ n ];          Dental: poor dentition[n  ]; Last Dentist visit:   Eye : blurred vision [  ]; diplopia [   ]; vision changes [  ];  Amaurosis fugax[  ]; Resp: cough [n  ];  wheezing[ n ];  hemoptysis[n  ]; shortness of breath[ n ]; paroxysmal nocturnal dyspnea[  ];  dyspnea on exertion[  ]; or orthopnea[  ];  GI:  gallstones[ n ], vomiting[n  ];  dysphagia[y  ]; melena[n  ];  hematochezia [ n ]; heartburn[  y];   Hx of  Colonoscopy[y  ]; GU: kidney stones [  ]; hematuria[  n];   dysuria [  ];  nocturia[  ];  history of     obstruction [n  ]; urinary frequency [  ]             Skin: rash, swelling[  ];, hair loss[  ];  peripheral edema[n  ];  or itching[  ]; Musculosketetal: myalgias[  ];  joint  swelling[n  ];  joint erythema[ n ];  joint pain[  ];  back pain[  ];  Heme/Lymph: bruising[  ];  bleeding[  ];  anemia[  ];  Neuro: TIA[ n ];  headaches[  ];  stroke[n  ];  vertigo[  ];  seizures[n  ];   paresthesias[  ];  difficulty walking[ n ];  Psych:depression[  ]; anxiety[  ];  Endocrine: diabetes[ n ];  thyroid dysfunction[ n ];  Immunizations: Flu up to date Blue.Reese  ]; Pneumococcal up to date [ y ];  Other:  Physical Exam: BP 113/71  Pulse 73  Resp 16  Ht 5' 0.5" (1.537 m)  Wt 120 lb (54.432 kg)  BMI 23.04 kg/m2  SpO2 97%  PHYSICAL EXAMINATION:  General appearance: alert, cooperative, appears older than stated age and no distress Neurologic: intact Heart: regular rate and rhythm, S1, S2 normal, no murmur, click, rub or gallop Lungs: clear to auscultation bilaterally and normal percussion bilaterally Abdomen: normal findings: aorta normal, bowel sounds normal, liver span normal to percussion, no bruits heard and no scars, striae, dilated veins, rashes, or lesions and abnormal findings: vague mass, located in the LUQ Extremities: extremities normal, atraumatic, no cyanosis or edema, Homans sign is negative, no sign of DVT, no edema, redness or tenderness in the calves or thighs and no ulcers, gangrene or trophic changes Patient has no cervical or supraclavicular or axillary adenopathy, there are no carotid bruits pedal pulses PT and DP are full bilaterally   Diagnostic Studies & Laboratory data:     Recent Radiology Findings:   Ct Chest W Contrast/Ct Abdomen Pelvis W Contrast  11/28/2013   CLINICAL DATA:  Proximal gastric mass.  EXAM: CT CHEST, ABDOMEN, AND PELVIS WITH CONTRAST  TECHNIQUE: Multidetector CT imaging of the chest, abdomen and pelvis was performed following the standard protocol during bolus administration of intravenous contrast.  CONTRAST:  74m OMNIPAQUE IOHEXOL 300 MG/ML  SOLN  COMPARISON:  No priors.  FINDINGS: CT CHEST FINDINGS  Mediastinum: Heart size is  normal. There is no significant pericardial fluid, thickening or pericardial calcification. There is atherosclerosis of the thoracic aorta, the great vessels of the mediastinum and the coronary arteries, including calcified atherosclerotic plaque in the left anterior descending coronary arteries. No pathologically enlarged mediastinal or hilar lymph nodes. Small hiatal hernia with some asymmetric thickening of the distal esophagus posteriorly.  Lungs/Pleura: No suspicious appearing pulmonary nodules or masses. No acute consolidative airspace disease. No pleural effusions.  Musculoskeletal: There are no aggressive appearing lytic or blastic lesions noted in the visualized portions of the skeleton.  CT ABDOMEN AND PELVIS FINDINGS  Abdomen/Pelvis: Subtle asymmetric thickening is noted along the a lesser curvature of the stomach, particularly near the gastroesophageal junction involving portions of the fundus and cardia. 6 mm gastrohepatic ligament lymph node is nonspecific. No  other definite lymphadenopathy is noted in the upper abdomen.  The appearance of the liver, gallbladder, pancreas, spleen, bilateral adrenal glands and bilateral kidneys is unremarkable. No significant volume of ascites. No pneumoperitoneum. No pathologic distention of small bowel. Several colonic diverticulae are noted, particularly in the region of the sigmoid colon, without surrounding inflammatory changes to suggest an acute diverticulitis at this time. Status post hysterectomy. Ovaries are not confidently identified may be surgically absent or atrophic.  Musculoskeletal: There are no aggressive appearing lytic or blastic lesions noted in the visualized portions of the skeleton.  IMPRESSION: 1. Asymmetric soft tissue thickening involving predominantly the fundus and cardia of the stomach, concerning for potential gastric neoplasm. There is also some mild asymmetric soft tissue thickening involving the posterior aspect of the distal esophagus.  2. No definite signs of metastatic disease in the chest, abdomen or pelvis. 3. Additional incidental findings, as above.   Electronically Signed   By: Vinnie Langton M.D.   On: 11/28/2013 12:42   Nm Pet Image Initial (pi) Skull Base To Thigh  12/23/2013   CLINICAL DATA:  Initial treatment strategy for gastric cancer.  EXAM: NUCLEAR MEDICINE PET SKULL BASE TO THIGH  TECHNIQUE: 9.1 mCi F-18 FDG was injected intravenously. Full-ring PET imaging was performed from the skull base to thigh after the radiotracer. CT data was obtained and used for attenuation correction and anatomic localization.  FASTING BLOOD GLUCOSE:  Value: 92. Mg/dl  COMPARISON:  None.  FINDINGS: NECK  No hypermetabolic lymph nodes in the neck.  CHEST  Mild nonspecific increased uptake is associated with bilateral hilar regions. SUV max is equal to 4.2 within the right hilar region and 3.4 in the left hilar region. No significant adenopathy identified on corresponding CT images. No suspicious pulmonary nodules on the CT scan.  ABDOMEN/PELVIS  Mild FDG uptake is associated with the mild diffuse wall thickening involving the stomach. In the area of the gastric cardia the SUV max is equal to 2.6. Along the greater curvature of the stomach the SUV max is equal to 2.8. No significant FDG uptake is associated with the gastrohepatic ligament lymph node. No abnormal hypermetabolic activity within the liver, pancreas, adrenal glands, or spleen. No hypermetabolic lymph nodes in the abdomen or pelvis.  SKELETON  No focal hypermetabolic activity to suggest skeletal metastasis.  IMPRESSION: 1. There is mild diffuse increased uptake associated with the distal esophagus and wall of stomach. The SUV max associated with the wall of stomach measures up to 2.6. 2. No hypermetabolic adenopathy or evidence of distant metastatic disease from patient's biopsy proven gastric cancer. 3. Mild increased uptake associated with bilateral hilar regions without adenopathy on  corresponding CT images.   Electronically Signed   By: Kerby Moors M.D.   On: 12/23/2013 14:09      Recent Lab Findings: Lab Results  Component Value Date   WBC 5.3 12/20/2013   HGB 13.2 12/20/2013   HCT 39.5 12/20/2013   PLT 249 12/20/2013   GLUCOSE 82 12/20/2013   ALT 11 12/20/2013   AST 19 12/20/2013   NA 142 12/20/2013   K 4.1 12/20/2013   CL 108 11/24/2013   CREATININE 0.8 12/20/2013   BUN 16.3 12/20/2013   CO2 23 12/20/2013   Path: [facility] 15,1751 Diagnosis Stomach, biopsy, r/o neoplasia - POORLY DIFFERENTIATED ADENOCARCINOMA WITH SIGNET RING CELLS, SEE COMMENT. - NEGATIVE FOR HELICOBACTER PYLORI. Diagnosis Note Many of the fragments consist of gastric mucosa with mucin expanding the lamina propria with sparse  atypical cells including signet ring cells. One fragment has more dense expansion of the lamina propria with poorly differentiated cells and signet ring cells, consistent with adenocarcinoma. There is background chronic gastritis.  EUS: Endoscopic findings: 1. The stomach was very poorly distensible There is obvious tumor involving about 2/3 of the stomach, circumferentially. The distal aspect of this obvious tumor is 8-9cm from the pyloris. The tumor was biopsies extensively, tunnel biopsied in several locations. EUS findings: 1. There tumor described above correlated with variably thickened, hypoechoic gastric wall by EUS. The most thickened gastric wall was in the proximal stomach were it was 1.2cm thick. 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   Patient  she appears to have a locally advanceddisease, she will need neoadjuvant chemotherapy, following which we can determine  whether the plan would be to do a complete abdominal approach or a combined thoracic and abdominal approach. She appears to be fit enough to tolerate surgery.     I spent 50 minutes counseling the patient face to face. The total time spent in the appointment was 80 minutes.  Grace Isaac MD      [address] [place] [address] Office [phone]   Beeper [phone]  12/26/2013 4:14 PM

## 2013-12-26 NOTE — Progress Notes (Signed)
[facility] OFFICE PROGRESS NOTE   Diagnosis: Gastric cancer  INTERVAL HISTORY:   Lynn Morris returns for further discussion of treatment options. Her case was reviewed at the GI tumor conference 12/21/2013. A repeat endoscopy and endoscopic ultrasound were recommended to assess the extent of tumor involving the stomach. She was taken to an EGD and endoscopic ultrasound by Dr. Ardis Hughs on 12/22/2013. Tumor involved approximately 2/3 of the stomach circumferentially. The distal aspect of the tumor was 8-9 cm from the pylorus. The tumor was biopsied. The tumor correlated with variable he thickened hypoechoic gastric wall. The most thickened gastric wall was in the proximal stomach measuring 1.2 cm. The proximal edge of the tumor was measured at 2 cm above the GE junction and continued into the distal stomach.  The pathology [id number]) confirmed a poorly differentiated adenocarcinoma with signet ring cells.  She is scheduled for placement of a Port-A-Cath 12/27/2013.  She continues to have solid dysphagia.  Objective:  Vital signs in last 24 hours:  Blood pressure 109/71, pulse 74, temperature 98.5 F (36.9 C), temperature source Oral, resp. rate 19, height 5' 0.5" (1.537 m), weight 120 lb 12.8 oz (54.795 kg).   Physical examination-not performed today Lab Results:  Lab Results  Component Value Date   WBC 5.3 12/20/2013   HGB 13.2 12/20/2013   HCT 39.5 12/20/2013   MCV 86.5 12/20/2013   PLT 249 12/20/2013   NEUTROABS 3.1 12/20/2013    Lab Results  Component Value Date   CEA 0.7 12/20/2013    Imaging:  No results found.  Medications: I have reviewed the patient's current medications.  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 with a biopsy highly suspicious for poorly differentiated carcinoma Staging CT scans 11/28/2013 with an indeterminate 6 mm gastrohepatic lymph node and no  evidence of distant metastatic disease Repeat EGD with EUS on 12/22/2013 confirmed tumor extending from 2 cm above the GE junction to 7-8 cm from the pylorus EGD biopsy 12/22/2013 confirmed poorly differentiated adenocarcinoma with signet ring cells PET scan 12/23/2013 with mild nonspecific increased uptake associated with the bilateral hilar regions with no adenopathy, mild FDG uptake associated with mild diffuse wall thickening involving the stomach, no significant uptake associated with a gastrohepatic ligament lymph node, no evidence of metastatic disease 2. History of gastroesophageal reflux disease and peptic stricture, status post an esophageal dilatation procedure in August 2012 3. Anorexia/weight loss 4. Solid dysphagia secondary to #1 5. Anxiety   Disposition:  Lynn Morris appears stable. Her case was discussed at the GI tumor conference 12/21/2013 and she has undergone a repeat EGD with EUS. The tumor appears to primarily involves stomach as opposed to a true GE junction carcinoma. The consensus opinion from the GI tumor conference is to treat this as gastric cancer. She has seen Drs. Barry Dienes and [place]. Neoadjuvant chemotherapy is recommended.  We decided to discontinue the plan for Taxol/carboplatin and radiation and proceed with single modality therapy. She understands there are multiple chemotherapy regimens that can be considered in this setting. I recommend a 5-fluorouracil and oxaliplatin-based regimen. We specifically discussed FOLFOX and CAPOX. She feels she can tolerate pills and would like to proceed with CAPOX chemotherapy.  I reviewed the potential toxicities associated with this regimen including the chance for nausea/vomiting, mucositis, diarrhea, alopecia, and hematologic toxicity. We discussed the rash, hyperpigmentation, and hand/foot syndrome associated with capecitabine. She understands the sun sensitivity seen with capecitabine. We reviewed  the various types of  neuropathy associated with oxaliplatin. She agrees to proceed. She was given reading materials on capecitabine and oxaliplatin.  She understands the small chance of undergoing curative surgery. The goal of systemic therapy is to decrease the tumor volume and potentially provide a survival benefit. She will be restaged after 3 cycles of Xeloda/oxaliplatin.  Lynn Morris is scheduled for a first cycle of chemotherapy to begin 01/02/2014. She will return for an office visit 01/16/2014.  Approximately 40 minutes were spent with patient today. The majority of time was used for counseling and coordination of care.  Ladell Pier, MD  12/26/2013  4:45 PM

## 2013-12-26 NOTE — Telephone Encounter (Signed)
Called pt with office visit appt for today at 4:15. Pt reports Dr. Benay Spice called her 6/5 with appt.

## 2013-12-26 NOTE — Telephone Encounter (Signed)
s/w pt re appts for 6/15, 6/29 and 7/6

## 2013-12-27 ENCOUNTER — Encounter: Payer: Self-pay | Admitting: Oncology

## 2013-12-27 ENCOUNTER — Ambulatory Visit (HOSPITAL_COMMUNITY): Payer: Medicare Other

## 2013-12-27 ENCOUNTER — Ambulatory Visit (HOSPITAL_COMMUNITY): Payer: Medicare Other | Admitting: Certified Registered Nurse Anesthetist

## 2013-12-27 ENCOUNTER — Encounter (HOSPITAL_COMMUNITY): Payer: Medicare Other | Admitting: Certified Registered Nurse Anesthetist

## 2013-12-27 ENCOUNTER — Ambulatory Visit (HOSPITAL_COMMUNITY)
Admission: RE | Admit: 2013-12-27 | Discharge: 2013-12-27 | Disposition: A | Discharge status: Home / Self Care | Payer: Medicare Other | Source: Ambulatory Visit | Attending: General Surgery | Admitting: General Surgery

## 2013-12-27 ENCOUNTER — Encounter (HOSPITAL_COMMUNITY): Payer: Self-pay | Admitting: *Deleted

## 2013-12-27 ENCOUNTER — Telehealth: Payer: Self-pay | Admitting: *Deleted

## 2013-12-27 ENCOUNTER — Other Ambulatory Visit: Payer: Self-pay | Admitting: *Deleted

## 2013-12-27 ENCOUNTER — Encounter (HOSPITAL_COMMUNITY)
Admission: RE | Disposition: A | Discharge status: Home / Self Care | Payer: Self-pay | Source: Ambulatory Visit | Attending: General Surgery

## 2013-12-27 DIAGNOSIS — M19079 Primary osteoarthritis, unspecified ankle and foot: Secondary | ICD-10-CM | POA: Insufficient documentation

## 2013-12-27 DIAGNOSIS — Z79899 Other long term (current) drug therapy: Secondary | ICD-10-CM | POA: Insufficient documentation

## 2013-12-27 DIAGNOSIS — Z860100 Personal history of colon polyps, unspecified: Secondary | ICD-10-CM | POA: Insufficient documentation

## 2013-12-27 DIAGNOSIS — H269 Unspecified cataract: Secondary | ICD-10-CM | POA: Insufficient documentation

## 2013-12-27 DIAGNOSIS — F329 Major depressive disorder, single episode, unspecified: Secondary | ICD-10-CM | POA: Insufficient documentation

## 2013-12-27 DIAGNOSIS — F3289 Other specified depressive episodes: Secondary | ICD-10-CM | POA: Insufficient documentation

## 2013-12-27 DIAGNOSIS — K449 Diaphragmatic hernia without obstruction or gangrene: Secondary | ICD-10-CM | POA: Insufficient documentation

## 2013-12-27 DIAGNOSIS — C169 Malignant neoplasm of stomach, unspecified: Secondary | ICD-10-CM | POA: Insufficient documentation

## 2013-12-27 DIAGNOSIS — K219 Gastro-esophageal reflux disease without esophagitis: Secondary | ICD-10-CM | POA: Insufficient documentation

## 2013-12-27 DIAGNOSIS — F411 Generalized anxiety disorder: Secondary | ICD-10-CM | POA: Insufficient documentation

## 2013-12-27 DIAGNOSIS — E785 Hyperlipidemia, unspecified: Secondary | ICD-10-CM | POA: Insufficient documentation

## 2013-12-27 DIAGNOSIS — Z885 Allergy status to narcotic agent status: Secondary | ICD-10-CM | POA: Insufficient documentation

## 2013-12-27 HISTORY — PX: PORTACATH PLACEMENT: SHX2246

## 2013-12-27 SURGERY — INSERTION, TUNNELED CENTRAL VENOUS DEVICE, WITH PORT
Anesthesia: Monitor Anesthesia Care | Site: Chest

## 2013-12-27 MED ORDER — LIDOCAINE HCL (PF) 1 % IJ SOLN
INTRAMUSCULAR | Status: AC
  Filled 2013-12-27: qty 30

## 2013-12-27 MED ORDER — PHENYLEPHRINE HCL 10 MG/ML IJ SOLN
INTRAMUSCULAR | Status: DC | PRN
  Administered 2013-12-27: 80 ug via INTRAVENOUS

## 2013-12-27 MED ORDER — HYDROCODONE-ACETAMINOPHEN 5-325 MG PO TABS
1.0000 | ORAL_TABLET | Freq: Once | ORAL | Status: AC
  Administered 2013-12-27: 1 via ORAL

## 2013-12-27 MED ORDER — LACTATED RINGERS IV SOLN
INTRAVENOUS | Status: DC | PRN
  Administered 2013-12-27: 07:00:00 via INTRAVENOUS

## 2013-12-27 MED ORDER — ONDANSETRON HCL 4 MG/2ML IJ SOLN
INTRAMUSCULAR | Status: AC
  Filled 2013-12-27: qty 2

## 2013-12-27 MED ORDER — LIDOCAINE HCL 1 % IJ SOLN
INTRAMUSCULAR | Status: DC | PRN
  Administered 2013-12-27: 10:00:00 via INTRAMUSCULAR

## 2013-12-27 MED ORDER — HEPARIN SODIUM (PORCINE) 5000 UNIT/ML IJ SOLN
INTRAMUSCULAR | Status: DC | PRN
  Administered 2013-12-27: 09:00:00

## 2013-12-27 MED ORDER — LIDOCAINE HCL (CARDIAC) 20 MG/ML IV SOLN
INTRAVENOUS | Status: AC
  Filled 2013-12-27: qty 5

## 2013-12-27 MED ORDER — ONDANSETRON HCL 4 MG/2ML IJ SOLN: 4.0000 mg | Freq: Once | INTRAMUSCULAR | Status: DC | PRN

## 2013-12-27 MED ORDER — CAPECITABINE 500 MG PO TABS: 1000.0000 mg | ORAL_TABLET | Freq: Two times a day (BID) | ORAL | Status: DC

## 2013-12-27 MED ORDER — SUCCINYLCHOLINE CHLORIDE 20 MG/ML IJ SOLN
INTRAMUSCULAR | Status: AC
  Filled 2013-12-27: qty 1

## 2013-12-27 MED ORDER — HYDROCODONE-ACETAMINOPHEN 5-325 MG PO TABS: 1.0000 | ORAL_TABLET | ORAL | Status: DC | PRN

## 2013-12-27 MED ORDER — HEPARIN SOD (PORK) LOCK FLUSH 100 UNIT/ML IV SOLN
INTRAVENOUS | Status: DC | PRN
  Administered 2013-12-27: 500 [IU]

## 2013-12-27 MED ORDER — PROPOFOL 10 MG/ML IV BOLUS
INTRAVENOUS | Status: AC
  Filled 2013-12-27: qty 20

## 2013-12-27 MED ORDER — CEFAZOLIN SODIUM-DEXTROSE 2-3 GM-% IV SOLR
INTRAVENOUS | Status: AC
  Filled 2013-12-27: qty 50

## 2013-12-27 MED ORDER — FENTANYL CITRATE 0.05 MG/ML IJ SOLN
INTRAMUSCULAR | Status: AC
  Filled 2013-12-27: qty 5

## 2013-12-27 MED ORDER — HYDROCODONE-ACETAMINOPHEN 5-325 MG PO TABS
ORAL_TABLET | ORAL | Status: AC
  Filled 2013-12-27: qty 1

## 2013-12-27 MED ORDER — PROPOFOL INFUSION 10 MG/ML OPTIME
INTRAVENOUS | Status: DC | PRN
  Administered 2013-12-27: 100 ug/kg/min via INTRAVENOUS

## 2013-12-27 MED ORDER — ONDANSETRON HCL 4 MG/2ML IJ SOLN
INTRAMUSCULAR | Status: DC | PRN
  Administered 2013-12-27: 4 mg via INTRAVENOUS

## 2013-12-27 MED ORDER — BUPIVACAINE-EPINEPHRINE (PF) 0.25% -1:200000 IJ SOLN
INTRAMUSCULAR | Status: AC
  Filled 2013-12-27: qty 30

## 2013-12-27 MED ORDER — FENTANYL CITRATE 0.05 MG/ML IJ SOLN: 25.0000 ug | INTRAMUSCULAR | Status: DC | PRN

## 2013-12-27 MED ORDER — PROPOFOL 10 MG/ML IV BOLUS: INTRAVENOUS | Status: DC | PRN

## 2013-12-27 MED ORDER — ONDANSETRON 8 MG PO TBDP: 8.0000 mg | ORAL_TABLET | Freq: Three times a day (TID) | ORAL | Status: DC | PRN

## 2013-12-27 MED ORDER — CEFAZOLIN SODIUM-DEXTROSE 2-3 GM-% IV SOLR
INTRAVENOUS | Status: DC | PRN
  Administered 2013-12-27: 2 g via INTRAVENOUS

## 2013-12-27 MED ORDER — MIDAZOLAM HCL 2 MG/2ML IJ SOLN
INTRAMUSCULAR | Status: AC
  Filled 2013-12-27: qty 2

## 2013-12-27 MED ORDER — HEPARIN SOD (PORK) LOCK FLUSH 100 UNIT/ML IV SOLN
INTRAVENOUS | Status: AC
  Filled 2013-12-27: qty 5

## 2013-12-27 MED ORDER — LACTATED RINGERS IV SOLN
INTRAVENOUS | Status: DC
  Administered 2013-12-27: 07:00:00 via INTRAVENOUS

## 2013-12-27 SURGICAL SUPPLY — 55 items
BAG DECANTER FOR FLEXI CONT (MISCELLANEOUS) ×6 IMPLANT
BARD-PARKER STAINLNESS STEEL SURGICAL BLADE11 ×3 IMPLANT
BLADE SURG 15 STRL LF DISP TIS (BLADE) ×1 IMPLANT
BLADE SURG 15 STRL SS (BLADE) ×2
CANISTER SUCTION 2500CC (MISCELLANEOUS) ×3 IMPLANT
CHLORAPREP W/TINT 10.5 ML (MISCELLANEOUS) IMPLANT
CHLORAPREP W/TINT 26ML (MISCELLANEOUS) ×3 IMPLANT
COVER SURGICAL LIGHT HANDLE (MISCELLANEOUS) ×3 IMPLANT
COVER TRANSDUCER ULTRASND (DRAPES) IMPLANT
CRADLE DONUT ADULT HEAD (MISCELLANEOUS) ×3 IMPLANT
DECANTER SPIKE VIAL GLASS SM (MISCELLANEOUS) IMPLANT
DERMABOND ADVANCED (GAUZE/BANDAGES/DRESSINGS) ×2
DERMABOND ADVANCED .7 DNX12 (GAUZE/BANDAGES/DRESSINGS) ×1 IMPLANT
DRAPE C-ARM 42X72 X-RAY (DRAPES) ×3 IMPLANT
DRAPE CHEST BREAST 15X10 FENES (DRAPES) ×3 IMPLANT
DRAPE UTILITY 15X26 W/TAPE STR (DRAPE) ×6 IMPLANT
DRAPE WARM FLUID 44X44 (DRAPE) IMPLANT
ELECT COATED BLADE 2.86 ST (ELECTRODE) ×3 IMPLANT
ELECT REM PT RETURN 9FT ADLT (ELECTROSURGICAL) ×3
ELECTRODE REM PT RTRN 9FT ADLT (ELECTROSURGICAL) ×1 IMPLANT
GAUZE SPONGE 4X4 16PLY XRAY LF (GAUZE/BANDAGES/DRESSINGS) ×3 IMPLANT
GLOVE BIO SURGEON STRL SZ 6 (GLOVE) ×3 IMPLANT
GLOVE BIOGEL PI IND STRL 6.5 (GLOVE) ×1 IMPLANT
GLOVE BIOGEL PI IND STRL 7.0 (GLOVE) ×3 IMPLANT
GLOVE BIOGEL PI INDICATOR 6.5 (GLOVE) ×2
GLOVE BIOGEL PI INDICATOR 7.0 (GLOVE) ×6
GLOVE SURG SS PI 7.0 STRL IVOR (GLOVE) ×3 IMPLANT
GLOVE SURG SS PI 7.5 STRL IVOR (GLOVE) ×3 IMPLANT
GOWN STRL REUS W/ TWL LRG LVL3 (GOWN DISPOSABLE) ×2 IMPLANT
GOWN STRL REUS W/TWL 2XL LVL3 (GOWN DISPOSABLE) ×3 IMPLANT
GOWN STRL REUS W/TWL LRG LVL3 (GOWN DISPOSABLE) ×4
KIT BASIN OR (CUSTOM PROCEDURE TRAY) ×3 IMPLANT
KIT PORT POWER 8FR ISP CVUE (Catheter) ×3 IMPLANT
KIT PORT POWER 9.6FR MRI PREA (Catheter) IMPLANT
KIT PORT POWER ISP 8FR (Catheter) IMPLANT
KIT POWER CATH 8FR (Catheter) IMPLANT
KIT ROOM TURNOVER OR (KITS) ×3 IMPLANT
NEEDLE HYPO 25GX1X1/2 BEV (NEEDLE) ×3 IMPLANT
NS IRRIG 1000ML POUR BTL (IV SOLUTION) ×3 IMPLANT
PACK SURGICAL SETUP 50X90 (CUSTOM PROCEDURE TRAY) ×3 IMPLANT
PAD ARMBOARD 7.5X6 YLW CONV (MISCELLANEOUS) ×6 IMPLANT
PENCIL BUTTON HOLSTER BLD 10FT (ELECTRODE) ×3 IMPLANT
SUT MON AB 4-0 PC3 18 (SUTURE) ×3 IMPLANT
SUT PROLENE 2 0 SH DA (SUTURE) ×6 IMPLANT
SUT VIC AB 3-0 SH 27 (SUTURE) ×2
SUT VIC AB 3-0 SH 27X BRD (SUTURE) ×1 IMPLANT
SYR 20ML ECCENTRIC (SYRINGE) ×6 IMPLANT
SYR 5ML LUER SLIP (SYRINGE) ×3 IMPLANT
SYR CONTROL 10ML LL (SYRINGE) ×3 IMPLANT
TOWEL OR 17X24 6PK STRL BLUE (TOWEL DISPOSABLE) ×3 IMPLANT
TOWEL OR 17X26 10 PK STRL BLUE (TOWEL DISPOSABLE) ×3 IMPLANT
TUBE CONNECTING 12'X1/4 (SUCTIONS) ×1
TUBE CONNECTING 12X1/4 (SUCTIONS) ×2 IMPLANT
WATER STERILE IRR 1000ML POUR (IV SOLUTION) IMPLANT
YANKAUER SUCT BULB TIP NO VENT (SUCTIONS) IMPLANT

## 2013-12-27 NOTE — Telephone Encounter (Signed)
Per request of Dr. Benay Spice, request sent to pathology (j. Epps) to  perform Her-2 Neu testing on Accession: [id number].

## 2013-12-27 NOTE — Progress Notes (Signed)
Faxed xeloda prescription to [facility] °

## 2013-12-27 NOTE — Anesthesia Postprocedure Evaluation (Signed)
  Anesthesia Post-op Note  Patient: Lynn Morris  Procedure(s) Performed: Procedure(s): INSERTION PORT-A-CATH (N/A)  Patient Location: PACU  Anesthesia Type:General  Level of Consciousness: awake, alert  and oriented  Airway and Oxygen Therapy: Patient Spontanous Breathing  Post-op Pain: mild  Post-op Assessment: Post-op Vital signs reviewed, Patient's Cardiovascular Status Stable, Respiratory Function Stable, Patent Airway and Pain level controlled  Post-op Vital Signs: stable  Last Vitals:  Filed Vitals:   12/27/13 1030  BP: 94/60  Pulse: 62  Temp:   Resp: 16    Complications: No apparent anesthesia complications

## 2013-12-27 NOTE — Interval H&P Note (Signed)
History and Physical Interval Note:  12/27/2013 7:30 AM  Lynn Morris  has presented today for surgery, with the diagnosis of Gastroesophageal cancer  The various methods of treatment have been discussed with the patient and family. After consideration of risks, benefits and other options for treatment, the patient has consented to  Procedure(s): INSERTION PORT-A-CATH (N/A) as a surgical intervention .  The patient's history has been reviewed, patient examined, no change in status, stable for surgery.  I have reviewed the patient's chart and labs.  Questions were answered to the patient's satisfaction.     Stark Klein

## 2013-12-27 NOTE — Anesthesia Preprocedure Evaluation (Signed)
Anesthesia Evaluation  Patient identified by MRN, date of birth, ID band Patient awake    Reviewed: Allergy & Precautions, H&P , NPO status , Patient's Chart, lab work & pertinent test results  Airway Mallampati: II TM Distance: >3 FB Neck ROM: Full    Dental  (+) Edentulous Upper, Dental Advisory Given   Pulmonary  breath sounds clear to auscultation        Cardiovascular Rhythm:Regular Rate:Normal     Neuro/Psych    GI/Hepatic   Endo/Other    Renal/GU      Musculoskeletal   Abdominal   Peds  Hematology   Anesthesia Other Findings   Reproductive/Obstetrics                           Anesthesia Physical Anesthesia Plan  ASA: III  Anesthesia Plan: MAC   Post-op Pain Management:    Induction: Intravenous  Airway Management Planned: Simple Face Mask and Natural Airway  Additional Equipment:   Intra-op Plan:   Post-operative Plan:   Informed Consent: I have reviewed the patients History and Physical, chart, labs and discussed the procedure including the risks, benefits and alternatives for the proposed anesthesia with the patient or authorized representative who has indicated his/her understanding and acceptance.     Plan Discussed with: CRNA and Anesthesiologist  Anesthesia Plan Comments:         Anesthesia Quick Evaluation

## 2013-12-27 NOTE — Transfer of Care (Signed)
Immediate Anesthesia Transfer of Care Note  Patient: Lynn Morris  Procedure(s) Performed: Procedure(s): INSERTION PORT-A-CATH (N/A)  Patient Location: PACU  Anesthesia Type:MAC  Level of Consciousness: awake, alert , oriented and patient cooperative  Airway & Oxygen Therapy: Patient Spontanous Breathing and Patient connected to nasal cannula oxygen  Post-op Assessment: Report given to PACU RN, Post -op Vital signs reviewed and stable and Patient moving all extremities X 4  Post vital signs: Reviewed and stable  Complications: No apparent anesthesia complications

## 2013-12-27 NOTE — H&P (View-Only) (Signed)
Chief Complaint  Patient presents with  . New Evaluation    eval for gastric cancer     HISTORY: Patient is a 73 year old female who presented to her very carefully positioned with a 35, weight loss and dysphasia. She has had to have her esophagus dilated in the past for a benign stricture. She thought that was what was going on and put it off for around 10 months. During that time, she continued to lose weight and lost approximately 35 pounds without trying. She did not notice any blood in her stool or significant weakness. She did notice that she was getting tired more easily. She underwent EGD and was found to have a proximal gastric mass. She is here to ascertain if there are surgical options for her or not. She has seen Dr. Benay Spice. She denies pain. She has not fever or chills.  Past Medical History  Diagnosis Date  . Esophageal reflux   . Esophageal stricture   . Depression   . Anxiety   . Hyperlipemia   . Allergy     SEASONAL  . Arthritis     LG TOE  . Cataract     BEGINNING RT.EYE  . Hx of colonic polyps   . Complication of anesthesia   . PONV (postoperative nausea and vomiting)   . Hiatal hernia 12-13-13    intermittent nausea with vomiting  . Cancer 11/24/13    gastroesophageal carcinoma  . History of blood transfusion     Past Surgical History  Procedure Laterality Date  . Abdominal hysterectomy  1978    Partial   . Cesarean section  1964 & 1966  . Nose surgery  1970  . Appendectomy    . Abdominal hysterectomy    . Eus N/A 12/22/2013    Procedure: UPPER ENDOSCOPIC ULTRASOUND (EUS) LINEAR;  Surgeon: Milus Banister, MD;  Location: [facility];  Service: Endoscopy;  Laterality: N/A;    Current Outpatient Prescriptions  Medication Sig Dispense Refill  . Calcium Carbonate-Vit D-Min (CALCIUM 1200 PO) Take 2 tablets by mouth daily.       . Multiple Vitamin (MULTIVITAMIN WITH MINERALS) TABS tablet Take 1 tablet by mouth daily.      . sertraline (ZOLOFT) 100 MG tablet  Take 100 mg by mouth every other day.       . vitamin B-12 (CYANOCOBALAMIN) 500 MCG tablet Take 500 mcg by mouth daily.      Marland Kitchen dexamethasone (DECADRON) 2 MG tablet Take 10 mg by mouth See admin instructions. Take 5 tablets at 10pm the day before Chemo and take 5 tablet at 6 am the day of 1st Chemo.      Marland Kitchen ibuprofen (ADVIL,MOTRIN) 200 MG tablet Take 200 mg by mouth every 6 (six) hours as needed for moderate pain.       Marland Kitchen lidocaine-prilocaine (EMLA) cream Apply to port a cath site one hour prior to use. Cover with plastic. Do not rub in.  30 g  0  . prochlorperazine (COMPAZINE) 10 MG tablet Take 1 tablet (10 mg total) by mouth every 6 (six) hours as needed for nausea.  60 tablet  0   No current facility-administered medications for this visit.     Allergies  Allergen Reactions  . Codeine     Cant remember     Family History  Problem Relation Age of Onset  . Diabetes Mother   . Hypertension Mother   . Pancreatic cancer Mother   . Colon cancer Neg Hx  History   Social History  . Marital Status: Married    Spouse Name: N/A    Number of Children: 2  . Years of Education: N/A   Occupational History  . Retired    Social History Main Topics  . Smoking status: Never Smoker   . Smokeless tobacco: Never Used  . Alcohol Use: No  . Drug Use: No  . Sexual Activity: None   Other Topics Concern  . None   Social History Narrative   2 caffeine drinks daily      REVIEW OF SYSTEMS - PERTINENT POSITIVES ONLY: 12 point review of systems negative other than HPI and PMH except for nausea, trouble swallowing, weight loss.    EXAM: Filed Vitals:   12/19/13 1427  BP: 110/78  Pulse: 78  Temp: 97.8 F (36.6 C)  Resp: 16    Wt Readings from Last 3 Encounters:  12/23/13 120 lb 12.8 oz (54.795 kg)  12/22/13 121 lb (54.885 kg)  12/22/13 121 lb (54.885 kg)     Gen:  No acute distress.  Well nourished and well groomed.   Neurological: Alert and oriented to person, place, and  time. Coordination normal.  Head: Normocephalic and atraumatic.  Eyes: Conjunctivae are normal. Pupils are equal, round, and reactive to light. No scleral icterus.  Neck: Normal range of motion. Neck supple. No tracheal deviation or thyromegaly present.  Cardiovascular: Normal rate, regular rhythm, normal heart sounds and intact distal pulses.  Exam reveals no gallop and no friction rub.  No murmur heard. Respiratory: Effort normal.  No respiratory distress. No chest wall tenderness. Breath sounds normal.  No wheezes, rales or rhonchi.  GI: Soft. Bowel sounds are normal. The abdomen is soft and nontender.  There is no rebound and no guarding.  Musculoskeletal: Normal range of motion. Extremities are nontender.  Lymphadenopathy: No cervical, preauricular, postauricular or axillary adenopathy is present Skin: Skin is warm and dry. No rash noted. No diaphoresis. No erythema. No pallor. No clubbing, cyanosis, or edema.   Psychiatric: Normal mood and affect. Behavior is normal. Judgment and thought content normal.    LABORATORY RESULTS: Available labs are reviewed   Recent Results (from the past 2160 hour(s))  COMPREHENSIVE METABOLIC PANEL     Status: None   Collection Time    11/24/13  9:23 AM      Result Value Ref Range   Sodium 141  135 - 145 mEq/L   Potassium 5.0  3.5 - 5.1 mEq/L   Chloride 108  96 - 112 mEq/L   CO2 28  19 - 32 mEq/L   Glucose, Bld 90  70 - 99 mg/dL   BUN 20  6 - 23 mg/dL   Creatinine, Ser 0.8  0.4 - 1.2 mg/dL   Total Bilirubin 0.8  0.2 - 1.2 mg/dL   Alkaline Phosphatase 47  39 - 117 U/L   AST 23  0 - 37 U/L   ALT 17  0 - 35 U/L   Total Protein 6.3  6.0 - 8.3 g/dL   Albumin 3.5  3.5 - 5.2 g/dL   Calcium 9.1  8.4 - 10.5 mg/dL   GFR 70.98  >60.00 mL/min  CBC WITH DIFFERENTIAL     Status: None   Collection Time    12/20/13 11:27 AM      Result Value Ref Range   WBC 5.3  3.9 - 10.3 10e3/uL   NEUT# 3.1  1.5 - 6.5 10e3/uL   HGB 13.2  11.6 -  15.9 g/dL   HCT 39.5   34.8 - 46.6 %   Platelets 249  145 - 400 10e3/uL   MCV 86.5  79.5 - 101.0 fL   MCH 29.0  25.1 - 34.0 pg   MCHC 33.5  31.5 - 36.0 g/dL   RBC 4.57  3.70 - 5.45 10e6/uL   RDW 12.8  11.2 - 14.5 %   lymph# 1.5  0.9 - 3.3 10e3/uL   MONO# 0.5  0.1 - 0.9 10e3/uL   Eosinophils Absolute 0.1  0.0 - 0.5 10e3/uL   Basophils Absolute 0.0  0.0 - 0.1 10e3/uL   NEUT% 59.1  38.4 - 76.8 %   LYMPH% 28.3  14.0 - 49.7 %   MONO% 9.3  0.0 - 14.0 %   EOS% 2.7  0.0 - 7.0 %   BASO% 0.6  0.0 - 2.0 %  CEA     Status: None   Collection Time    12/20/13 11:28 AM      Result Value Ref Range   CEA 0.7  0.0 - 5.0 ng/mL  COMPREHENSIVE METABOLIC PANEL (PI95)     Status: Abnormal   Collection Time    12/20/13 11:28 AM      Result Value Ref Range   Sodium 142  136 - 145 mEq/L   Potassium 4.1  3.5 - 5.1 mEq/L   Chloride 107  98 - 109 mEq/L   CO2 23  22 - 29 mEq/L   Glucose 82  70 - 140 mg/dl   BUN 16.3  7.0 - 26.0 mg/dL   Creatinine 0.8  0.6 - 1.1 mg/dL   Total Bilirubin 0.54  0.20 - 1.20 mg/dL   Alkaline Phosphatase 50  40 - 150 U/L   AST 19  5 - 34 U/L   ALT 11  0 - 55 U/L   Total Protein 6.3 (*) 6.4 - 8.3 g/dL   Albumin 3.3 (*) 3.5 - 5.0 g/dL   Calcium 9.1  8.4 - 10.4 mg/dL   Anion Gap 11  3 - 11 mEq/L  GLUCOSE, CAPILLARY     Status: None   Collection Time    12/23/13 11:24 AM      Result Value Ref Range   Glucose-Capillary 92  70 - 99 mg/dL     RADIOLOGY RESULTS: See E-Chart or I-Site for most recent results.  Images and reports are reviewed.  Ct Chest W Contrast  11/28/2013   CLINICAL DATA:  Proximal gastric mass.  EXAM: CT CHEST, ABDOMEN, AND PELVIS WITH CONTRAST  TECHNIQUE: Multidetector CT imaging of the chest, abdomen and pelvis was performed following the standard protocol during bolus administration of intravenous contrast.  CONTRAST:  41mL OMNIPAQUE IOHEXOL 300 MG/ML  SOLN  COMPARISON:  No priors.  FINDINGS: CT CHEST FINDINGS  Mediastinum: Heart size is normal. There is no significant  pericardial fluid, thickening or pericardial calcification. There is atherosclerosis of the thoracic aorta, the great vessels of the mediastinum and the coronary arteries, including calcified atherosclerotic plaque in the left anterior descending coronary arteries. No pathologically enlarged mediastinal or hilar lymph nodes. Small hiatal hernia with some asymmetric thickening of the distal esophagus posteriorly.  Lungs/Pleura: No suspicious appearing pulmonary nodules or masses. No acute consolidative airspace disease. No pleural effusions.  Musculoskeletal: There are no aggressive appearing lytic or blastic lesions noted in the visualized portions of the skeleton.  CT ABDOMEN AND PELVIS FINDINGS  Abdomen/Pelvis: Subtle asymmetric thickening is noted along the a lesser curvature of the stomach,  particularly near the gastroesophageal junction involving portions of the fundus and cardia. 6 mm gastrohepatic ligament lymph node is nonspecific. No other definite lymphadenopathy is noted in the upper abdomen.  The appearance of the liver, gallbladder, pancreas, spleen, bilateral adrenal glands and bilateral kidneys is unremarkable. No significant volume of ascites. No pneumoperitoneum. No pathologic distention of small bowel. Several colonic diverticulae are noted, particularly in the region of the sigmoid colon, without surrounding inflammatory changes to suggest an acute diverticulitis at this time. Status post hysterectomy. Ovaries are not confidently identified may be surgically absent or atrophic.  Musculoskeletal: There are no aggressive appearing lytic or blastic lesions noted in the visualized portions of the skeleton.  IMPRESSION: 1. Asymmetric soft tissue thickening involving predominantly the fundus and cardia of the stomach, concerning for potential gastric neoplasm. There is also some mild asymmetric soft tissue thickening involving the posterior aspect of the distal esophagus. 2. No definite signs of  metastatic disease in the chest, abdomen or pelvis. 3. Additional incidental findings, as above.   Electronically Signed   By: Vinnie Langton M.D.   On: 11/28/2013 12:42   Ct Abdomen Pelvis W Contrast  11/28/2013   CLINICAL DATA:  Proximal gastric mass.  EXAM: CT CHEST, ABDOMEN, AND PELVIS WITH CONTRAST  TECHNIQUE: Multidetector CT imaging of the chest, abdomen and pelvis was performed following the standard protocol during bolus administration of intravenous contrast.  CONTRAST:  63mL OMNIPAQUE IOHEXOL 300 MG/ML  SOLN  COMPARISON:  No priors.  FINDINGS: CT CHEST FINDINGS  Mediastinum: Heart size is normal. There is no significant pericardial fluid, thickening or pericardial calcification. There is atherosclerosis of the thoracic aorta, the great vessels of the mediastinum and the coronary arteries, including calcified atherosclerotic plaque in the left anterior descending coronary arteries. No pathologically enlarged mediastinal or hilar lymph nodes. Small hiatal hernia with some asymmetric thickening of the distal esophagus posteriorly.  Lungs/Pleura: No suspicious appearing pulmonary nodules or masses. No acute consolidative airspace disease. No pleural effusions.  Musculoskeletal: There are no aggressive appearing lytic or blastic lesions noted in the visualized portions of the skeleton.  CT ABDOMEN AND PELVIS FINDINGS  Abdomen/Pelvis: Subtle asymmetric thickening is noted along the a lesser curvature of the stomach, particularly near the gastroesophageal junction involving portions of the fundus and cardia. 6 mm gastrohepatic ligament lymph node is nonspecific. No other definite lymphadenopathy is noted in the upper abdomen.  The appearance of the liver, gallbladder, pancreas, spleen, bilateral adrenal glands and bilateral kidneys is unremarkable. No significant volume of ascites. No pneumoperitoneum. No pathologic distention of small bowel. Several colonic diverticulae are noted, particularly in the  region of the sigmoid colon, without surrounding inflammatory changes to suggest an acute diverticulitis at this time. Status post hysterectomy. Ovaries are not confidently identified may be surgically absent or atrophic.  Musculoskeletal: There are no aggressive appearing lytic or blastic lesions noted in the visualized portions of the skeleton.  IMPRESSION: 1. Asymmetric soft tissue thickening involving predominantly the fundus and cardia of the stomach, concerning for potential gastric neoplasm. There is also some mild asymmetric soft tissue thickening involving the posterior aspect of the distal esophagus. 2. No definite signs of metastatic disease in the chest, abdomen or pelvis. 3. Additional incidental findings, as above.   Electronically Signed   By: Vinnie Langton M.D.   On: 11/28/2013 12:42   Nm Pet Image Initial (pi) Skull Base To Thigh  12/23/2013   CLINICAL DATA:  Initial treatment strategy for gastric cancer.  EXAM:  NUCLEAR MEDICINE PET SKULL BASE TO THIGH  TECHNIQUE: 9.1 mCi F-18 FDG was injected intravenously. Full-ring PET imaging was performed from the skull base to thigh after the radiotracer. CT data was obtained and used for attenuation correction and anatomic localization.  FASTING BLOOD GLUCOSE:  Value: 92. Mg/dl  COMPARISON:  None.  FINDINGS: NECK  No hypermetabolic lymph nodes in the neck.  CHEST  Mild nonspecific increased uptake is associated with bilateral hilar regions. SUV max is equal to 4.2 within the right hilar region and 3.4 in the left hilar region. No significant adenopathy identified on corresponding CT images. No suspicious pulmonary nodules on the CT scan.  ABDOMEN/PELVIS  Mild FDG uptake is associated with the mild diffuse wall thickening involving the stomach. In the area of the gastric cardia the SUV max is equal to 2.6. Along the greater curvature of the stomach the SUV max is equal to 2.8. No significant FDG uptake is associated with the gastrohepatic ligament lymph  node. No abnormal hypermetabolic activity within the liver, pancreas, adrenal glands, or spleen. No hypermetabolic lymph nodes in the abdomen or pelvis.  SKELETON  No focal hypermetabolic activity to suggest skeletal metastasis.  IMPRESSION: 1. There is mild diffuse increased uptake associated with the distal esophagus and wall of stomach. The SUV max associated with the wall of stomach measures up to 2.6. 2. No hypermetabolic adenopathy or evidence of distant metastatic disease from patient's biopsy proven gastric cancer. 3. Mild increased uptake associated with bilateral hilar regions without adenopathy on corresponding CT images.   Electronically Signed   By: Kerby Moors M.D.   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 Her regular endoscopy did not appear quite as extensive. She is scheduled to undergo endoscopic ultrasound this week as well as a PET scan. These will determine what approach we take with her therapy. If she is found to have metastatic disease, she would proceed with palliative chemotherapy.  If she appears to have localized disease, we will determine whether the plan would be to do a complete abdominal approach or a combined thoracic and abdominal approach.  Even if we proceed with eventual surgery planned, she will need neoadjuvant chemotherapy.    She is encouraged to remain on proton pump inhibitors. She will see the dietitian. She is in very good shape.  I think she can tolerate surgery if that is an option, even if it requires total gastrectomy.    I will place her on the OR schedule for a port.  We discussed port a cath in terms of risks/benefits.  She wishes to proceed.   45 min spent in counseling/evaluation/examination/ and coordination of care.       Milus Height MD Surgical Oncology, [facility] and [facility]      Visit Diagnoses: 1. Gastric  cancer     Primary Care Physician: Leamon Arnt, MD   Genelle Gather

## 2013-12-27 NOTE — Discharge Instructions (Signed)
[facility] Office Phone Number [phone]   POST OP INSTRUCTIONS  Always review your discharge instruction sheet given to you by the facility where your surgery was performed.  IF YOU HAVE DISABILITY OR FAMILY LEAVE FORMS, YOU MUST BRING THEM TO THE OFFICE FOR PROCESSING.  DO NOT GIVE THEM TO YOUR DOCTOR.  1. A prescription for pain medication may be given to you upon discharge.  Take your pain medication as prescribed, if needed.  If narcotic pain medicine is not needed, then you may take acetaminophen (Tylenol) or ibuprofen (Advil) as needed. 2. Take your usually prescribed medications unless otherwise directed 3. If you need a refill on your pain medication, please contact your pharmacy.  They will contact our office to request authorization.  Prescriptions will not be filled after 5pm or on week-ends. 4. You should eat very light the first 24 hours after surgery, such as soup, crackers, pudding, etc.  Resume your normal diet the day after surgery 5. It is common to experience some constipation if taking pain medication after surgery.  Increasing fluid intake and taking a stool softener will usually help or prevent this problem from occurring.  A mild laxative (Milk of Magnesia or Miralax) should be taken according to package directions if there are no bowel movements after 48 hours. 6. You may shower in 48 hours.  The surgical glue will flake off in 2-3 weeks.   7. ACTIVITIES:  No strenuous activity or heavy lifting for 1 week.   a. You may drive when you no longer are taking prescription pain medication, you can comfortably wear a seatbelt, and you can safely maneuver your car and apply brakes. b. RETURN TO WORK:  _________as tolerated as long as restrictions are followed._______________ Dennis Bast should see your doctor in the office for a follow-up appointment approximately three-four weeks after your surgery.    WHEN TO CALL YOUR DOCTOR: 1. Fever over 101.0 2. Nausea and/or  vomiting. 3. Extreme swelling or bruising. 4. Continued bleeding from incision. 5. Increased pain, redness, or drainage from the incision.  The clinic staff is available to answer your questions during regular business hours.  Please dont hesitate to call and ask to speak to one of the nurses for clinical concerns.  If you have a medical emergency, go to the nearest emergency room or call 911.  A surgeon from [facility] is always on call at the hospital.  For further questions, please visit [email or web address]

## 2013-12-27 NOTE — Op Note (Signed)
PREOPERATIVE DIAGNOSIS:  Locally advanced gastric cancer     POSTOPERATIVE DIAGNOSIS:  Same     PROCEDURE: Left subclavian port placement, Bard ClearVue  Power Port, MRI safe, 8-French.      SURGEON:  Stark Klein, MD      ANESTHESIA:  General   FINDINGS:  Good venous return, easy flush, and tip of the catheter and   SVC 23.5 cm.      SPECIMEN:  None.      ESTIMATED BLOOD LOSS:  Minimal.      COMPLICATIONS:  None known.      PROCEDURE:  Pt was identified in the holding area and taken to   the operating room, where patient was placed supine on the operating room   table.  MAC anesthesia was induced.  Patient's arms were tucked and the upper   chest and neck were prepped and draped in sterile fashion.  Time-out was   performed according to the surgical safety check list.  When all was   correct, we continued.   Local anesthetic was administered over this   area at the angle of the clavicle.  The vein was accessed with 1 pass of the needle. There was good venous return and the wire passed easily with no ectopy.   Fluoroscopy was used to confirm that the wire was in the vena cava.      The patient was placed back level and the area for the pocket was anethetized   with local anesthetic.  A 3-cm transverse incision was made with a #15   blade.  Cautery was used to divide the subcutaneous tissues down to the   pectoralis muscle.  An [employer] retractor was used to elevate the skin   while a pocket was created on top of the pectoralis fascia.  The port   was placed into the pocket to confirm that it was of adequate size.  The   catheter was preattached to the port.  The port was then secured to the   pectoralis fascia with four 2-0 Prolene sutures.  These were clamped and   not tied down yet.    The catheter was tunneled through to the wire exit   site.  The catheter was placed along the wire to determine what length it should be to be in the SVC.  The catheter was cut at 23.5 cm.   The tunneler sheath and dilator were passed over the wire and the dilator and wire were removed.  The catheter was advanced through the tunneler sheath and the tunneler sheath was pulled away.  Care was taken to keep the catheter in the tunneler sheath as this occurred. This was advanced and the tunneler sheath was removed.  There was good venous   return and easy flush of the catheter.  The Prolene sutures were tied   down to the pectoral fascia.  The skin was reapproximated using 3-0   Vicryl interrupted deep dermal sutures.    Fluoroscopy was used to re-confirm good position of the catheter.  The skin   was then closed using 4-0 Monocryl in a subcuticular fashion.  The port was flushed with concentrated heparin flush as well.  The wounds were then cleaned, dried, and dressed with Dermabond.  The patient was awakened from anesthesia and taken to the PACU in stable condition.  Needle, sponge, and instrument counts were correct.               Stark Klein, MD

## 2013-12-27 NOTE — Progress Notes (Signed)
Care of pt assumed by MA Zanaria Morell RN 

## 2014-01-02 ENCOUNTER — Encounter (HOSPITAL_COMMUNITY): Payer: Self-pay | Admitting: General Surgery

## 2014-01-02 ENCOUNTER — Ambulatory Visit (HOSPITAL_BASED_OUTPATIENT_CLINIC_OR_DEPARTMENT_OTHER): Payer: Medicare Other

## 2014-01-02 ENCOUNTER — Ambulatory Visit: Payer: Medicare Other | Admitting: Oncology

## 2014-01-02 ENCOUNTER — Ambulatory Visit: Payer: Medicare Other | Admitting: Nutrition

## 2014-01-02 ENCOUNTER — Other Ambulatory Visit: Payer: Medicare Other

## 2014-01-02 VITALS — BP 119/75 | HR 67 | Temp 97.1°F | Resp 17

## 2014-01-02 DIAGNOSIS — Z5111 Encounter for antineoplastic chemotherapy: Secondary | ICD-10-CM

## 2014-01-02 DIAGNOSIS — C169 Malignant neoplasm of stomach, unspecified: Secondary | ICD-10-CM

## 2014-01-02 MED ORDER — ONDANSETRON 8 MG/NS 50 ML IVPB
INTRAVENOUS | Status: AC
  Filled 2014-01-02: qty 8

## 2014-01-02 MED ORDER — DEXTROSE 5 % IV SOLN
100.0000 mg/m2 | Freq: Once | INTRAVENOUS | Status: AC
  Administered 2014-01-02: 155 mg via INTRAVENOUS
  Filled 2014-01-02: qty 31

## 2014-01-02 MED ORDER — DEXAMETHASONE SODIUM PHOSPHATE 10 MG/ML IJ SOLN
INTRAMUSCULAR | Status: AC
  Filled 2014-01-02: qty 1

## 2014-01-02 MED ORDER — DEXTROSE 5 % IV SOLN
Freq: Once | INTRAVENOUS | Status: AC
  Administered 2014-01-02: 11:00:00 via INTRAVENOUS

## 2014-01-02 MED ORDER — HEPARIN SOD (PORK) LOCK FLUSH 100 UNIT/ML IV SOLN
500.0000 [IU] | Freq: Once | INTRAVENOUS | Status: AC | PRN
  Administered 2014-01-02: 500 [IU]
  Filled 2014-01-02: qty 5

## 2014-01-02 MED ORDER — ONDANSETRON 8 MG/50ML IVPB (CHCC)
8.0000 mg | Freq: Once | INTRAVENOUS | Status: AC
  Administered 2014-01-02: 8 mg via INTRAVENOUS

## 2014-01-02 MED ORDER — SODIUM CHLORIDE 0.9 % IJ SOLN
10.0000 mL | INTRAMUSCULAR | Status: DC | PRN
  Administered 2014-01-02: 10 mL
  Filled 2014-01-02: qty 10

## 2014-01-02 MED ORDER — DEXAMETHASONE SODIUM PHOSPHATE 10 MG/ML IJ SOLN
10.0000 mg | Freq: Once | INTRAMUSCULAR | Status: AC
  Administered 2014-01-02: 10 mg via INTRAVENOUS

## 2014-01-02 NOTE — Patient Instructions (Addendum)
[facility] Discharge Instructions for Patients Receiving Chemotherapy  Today you received the following chemotherapy agent: Oxaliplatin   To help prevent nausea and vomiting after your treatment, we encourage you to take your nausea medication as prescribed. You received Zofran and Decadron in the infusion room at 11:00. You may take Compazine every 6 hours as needed for nausea beginning now. You may take more Zofran as needed for nausea starting at 7:00 pm (every 8 hours).    If you develop nausea and vomiting that is not controlled by your nausea medication, call the clinic.   BELOW ARE SYMPTOMS THAT SHOULD BE REPORTED IMMEDIATELY:  *FEVER GREATER THAN 100.5 F  *CHILLS WITH OR WITHOUT FEVER  NAUSEA AND VOMITING THAT IS NOT CONTROLLED WITH YOUR NAUSEA MEDICATION  *UNUSUAL SHORTNESS OF BREATH  *UNUSUAL BRUISING OR BLEEDING  TENDERNESS IN MOUTH AND THROAT WITH OR WITHOUT PRESENCE OF ULCERS  *URINARY PROBLEMS  *BOWEL PROBLEMS  UNUSUAL RASH Items with * indicate a potential emergency and should be followed up as soon as possible.  Feel free to call the clinic you have any questions or concerns. The clinic phone number is [phone].   Capecitabine tablets What is this medicine? CAPECITABINE (ka pe SITE a been) is a chemotherapy drug. It slows the growth of cancer cells. This medicine is used to treat breast cancer, and also colon or rectal cancer. This medicine may be used for other purposes; ask your health care provider or pharmacist if you have questions. COMMON BRAND NAME(S): Xeloda What should I tell my health care provider before I take this medicine? They need to know if you have any of these conditions: -bleeding or blood disorders -dihydropyrimidine dehydrogenase (DPD) deficiency -heart disease -infection (especially a virus infection such as chickenpox, cold sores, or herpes) -kidney disease -liver disease -an unusual or allergic reaction to  capecitabine, 5-fluorouracil, other medicines, foods, dyes, or preservatives -pregnant or trying to get pregnant -breast-feeding How should I use this medicine? Take this medicine by mouth with a glass of water, within 30 minutes of the end of a meal. Follow the directions on the prescription label. Take your medicine at regular intervals. Do not take it more often than directed. Do not stop taking except on your doctor's advice. Your doctor may want you to take a combination of 150 mg and 500 mg tablets for each dose. It is very important that you know how to correctly take your dose. Taking the wrong tablets could result in an overdose (too much medication) or underdose (too little medication). Talk to your pediatrician regarding the use of this medicine in children. Special care may be needed. Overdosage: If you think you have taken too much of this medicine contact a poison control center or emergency room at once. NOTE: This medicine is only for you. Do not share this medicine with others. What if I miss a dose? If you miss a dose, do not take the missed dose at all. Do not take double or extra doses. Instead, continue with your next scheduled dose and check with your doctor. What may interact with this medicine? -antacids with aluminum and/or magnesium -folic acid -leucovorin -medicines to increase blood counts like filgrastim, pegfilgrastim, sargramostim -phenytoin -vaccines -warfarin Talk to your doctor or health care professional before taking any of these medicines: -acetaminophen -aspirin -ibuprofen -ketoprofen -naproxen This list may not describe all possible interactions. Give your health care provider a list of all the medicines, herbs, non-prescription drugs, or dietary supplements  you use. Also tell them if you smoke, drink alcohol, or use illegal drugs. Some items may interact with your medicine. What should I watch for while using this medicine? Visit your doctor for  checks on your progress. This drug may make you feel generally unwell. This is not uncommon, as chemotherapy can affect healthy cells as well as cancer cells. Report any side effects. Continue your course of treatment even though you feel ill unless your doctor tells you to stop. In some cases, you may be given additional medicines to help with side effects. Follow all directions for their use. Call your doctor or health care professional for advice if you get a fever, chills or sore throat, or other symptoms of a cold or flu. Do not treat yourself. This drug decreases your body's ability to fight infections. Try to avoid being around people who are sick. This medicine may increase your risk to bruise or bleed. Call your doctor or health care professional if you notice any unusual bleeding. Be careful brushing and flossing your teeth or using a toothpick because you may get an infection or bleed more easily. If you have any dental work done, tell your dentist you are receiving this medicine. Avoid taking products that contain aspirin, acetaminophen, ibuprofen, naproxen, or ketoprofen unless instructed by your doctor. These medicines may hide a fever. Do not become pregnant while taking this medicine. Women should inform their doctor if they wish to become pregnant or think they might be pregnant. There is a potential for serious side effects to an unborn child. Talk to your health care professional or pharmacist for more information. Do not breast-feed an infant while taking this medicine. Men are advised not to father a child while taking this medicine. What side effects may I notice from receiving this medicine? Side effects that you should report to your doctor or health care professional as soon as possible: -allergic reactions like skin rash, itching or hives, swelling of the face, lips, or tongue -low blood counts - this medicine may decrease the number of white blood cells, red blood cells and  platelets. You may be at increased risk for infections and bleeding. -signs of infection - fever or chills, cough, sore throat, pain or difficulty passing urine -signs of decreased platelets or bleeding - bruising, pinpoint red spots on the skin, black, tarry stools, blood in the urine -signs of decreased red blood cells - unusually weak or tired, fainting spells, lightheadedness -breathing problems -changes in vision -chest pain -diarrhea of more than 4 bowel movements in one day or any diarrhea at night -mouth sores -nausea and vomiting -pain, swelling, redness at site where injected -pain, tingling, numbness in the hands or feet -redness, swelling, or sores on hands or feet -stomach pain -vomiting -yellow color of skin or eyes Side effects that usually do not require medical attention (report to your doctor or health care professional if they continue or are bothersome): -constipation -diarrhea -dry or itchy skin -hair loss -loss of appetite -nausea -weak or tired This list may not describe all possible side effects. Call your doctor for medical advice about side effects. You may report side effects to FDA at 1-800-FDA-1088. Where should I keep my medicine? Keep out of the reach of children. Store at room temperature between 15 and 30 degrees C (59 and 86 degrees F). Keep container tightly closed. Throw away any unused medicine after the expiration date. NOTE: This sheet is a summary. It may not cover all possible  information. If you have questions about this medicine, talk to your doctor, pharmacist, or health care provider.  2014, Elsevier/Gold Standard. (2008-06-19 11:47:41) Oxaliplatin Injection What is this medicine? OXALIPLATIN (ox AL i PLA tin) is a chemotherapy drug. It targets fast dividing cells, like cancer cells, and causes these cells to die. This medicine is used to treat cancers of the colon and rectum, and many other cancers. This medicine may be used for other  purposes; ask your health care provider or pharmacist if you have questions. COMMON BRAND NAME(S): Eloxatin What should I tell my health care provider before I take this medicine? They need to know if you have any of these conditions: -kidney disease -an unusual or allergic reaction to oxaliplatin, other chemotherapy, other medicines, foods, dyes, or preservatives -pregnant or trying to get pregnant -breast-feeding How should I use this medicine? This drug is given as an infusion into a vein. It is administered in a hospital or clinic by a specially trained health care professional. Talk to your pediatrician regarding the use of this medicine in children. Special care may be needed. Overdosage: If you think you have taken too much of this medicine contact a poison control center or emergency room at once. NOTE: This medicine is only for you. Do not share this medicine with others. What if I miss a dose? It is important not to miss a dose. Call your doctor or health care professional if you are unable to keep an appointment. What may interact with this medicine? -medicines to increase blood counts like filgrastim, pegfilgrastim, sargramostim -probenecid -some antibiotics like amikacin, gentamicin, neomycin, polymyxin B, streptomycin, tobramycin -zalcitabine Talk to your doctor or health care professional before taking any of these medicines: -acetaminophen -aspirin -ibuprofen -ketoprofen -naproxen This list may not describe all possible interactions. Give your health care provider a list of all the medicines, herbs, non-prescription drugs, or dietary supplements you use. Also tell them if you smoke, drink alcohol, or use illegal drugs. Some items may interact with your medicine. What should I watch for while using this medicine? Your condition will be monitored carefully while you are receiving this medicine. You will need important blood work done while you are taking this medicine. This  medicine can make you more sensitive to cold. Do not drink cold drinks or use ice. Cover exposed skin before coming in contact with cold temperatures or cold objects. When out in cold weather wear warm clothing and cover your mouth and nose to warm the air that goes into your lungs. Tell your doctor if you get sensitive to the cold. This drug may make you feel generally unwell. This is not uncommon, as chemotherapy can affect healthy cells as well as cancer cells. Report any side effects. Continue your course of treatment even though you feel ill unless your doctor tells you to stop. In some cases, you may be given additional medicines to help with side effects. Follow all directions for their use. Call your doctor or health care professional for advice if you get a fever, chills or sore throat, or other symptoms of a cold or flu. Do not treat yourself. This drug decreases your body's ability to fight infections. Try to avoid being around people who are sick. This medicine may increase your risk to bruise or bleed. Call your doctor or health care professional if you notice any unusual bleeding. Be careful brushing and flossing your teeth or using a toothpick because you may get an infection or bleed more easily.  If you have any dental work done, tell your dentist you are receiving this medicine. Avoid taking products that contain aspirin, acetaminophen, ibuprofen, naproxen, or ketoprofen unless instructed by your doctor. These medicines may hide a fever. Do not become pregnant while taking this medicine. Women should inform their doctor if they wish to become pregnant or think they might be pregnant. There is a potential for serious side effects to an unborn child. Talk to your health care professional or pharmacist for more information. Do not breast-feed an infant while taking this medicine. Call your doctor or health care professional if you get diarrhea. Do not treat yourself. What side effects may I  notice from receiving this medicine? Side effects that you should report to your doctor or health care professional as soon as possible: -allergic reactions like skin rash, itching or hives, swelling of the face, lips, or tongue -low blood counts - This drug may decrease the number of white blood cells, red blood cells and platelets. You may be at increased risk for infections and bleeding. -signs of infection - fever or chills, cough, sore throat, pain or difficulty passing urine -signs of decreased platelets or bleeding - bruising, pinpoint red spots on the skin, black, tarry stools, nosebleeds -signs of decreased red blood cells - unusually weak or tired, fainting spells, lightheadedness -breathing problems -chest pain, pressure -cough -diarrhea -jaw tightness -mouth sores -nausea and vomiting -pain, swelling, redness or irritation at the injection site -pain, tingling, numbness in the hands or feet -problems with balance, talking, walking -redness, blistering, peeling or loosening of the skin, including inside the mouth -trouble passing urine or change in the amount of urine Side effects that usually do not require medical attention (report to your doctor or health care professional if they continue or are bothersome): -changes in vision -constipation -hair loss -loss of appetite -metallic taste in the mouth or changes in taste -stomach pain This list may not describe all possible side effects. Call your doctor for medical advice about side effects. You may report side effects to FDA at 1-800-FDA-1088. Where should I keep my medicine? This drug is given in a hospital or clinic and will not be stored at home. NOTE: This sheet is a summary. It may not cover all possible information. If you have questions about this medicine, talk to your doctor, pharmacist, or health care provider.  2014, Elsevier/Gold Standard. (2008-02-01 17:22:47)

## 2014-01-02 NOTE — Progress Notes (Signed)
Per Dr. Benay Spice, [place] to treat per 12/20/13 labs.

## 2014-01-02 NOTE — Progress Notes (Signed)
Nutrition followup completed with patient during chemotherapy.  Patient has been treated for gastroesophageal cancer.  Weight has been stable since May 22.  Weight documented as 120 pounds on June 9.  Patient reports she tries to eat small, frequent meals, but sometimes forgets.  Patient reports she continues to have difficult swallowing.  She does have pain with swallowing.  Food has lost most of the taste.  Nutrition diagnosis: Unintended weight loss, improved.  Intervention: Recommended patient continue meals with snacks daily to promote weight maintenance.  Reviewed oral nutrition supplements for patient to try.  Enforced importance of adequate protein foods.  Recommended patient try smoothies.  Questions were answered.  Teach back method used.  Monitoring, evaluation, goals: Patient will tolerate adequate calories and protein for continued weight maintenance.    Next visit: Monday, July 6, during chemotherapy.

## 2014-01-03 ENCOUNTER — Telehealth: Payer: Self-pay | Admitting: *Deleted

## 2014-01-03 NOTE — Telephone Encounter (Signed)
Message copied by Jesse Fall on Tue Jan 03, 2014  3:28 PM ------      Message from: Thane Edu R      Created: Mon Jan 02, 2014  2:18 PM      Regarding: Chemo f/u call      Contact: [phone]       First time Oxaliplatin-Dr. Sherrill-No difficulties ------

## 2014-01-03 NOTE — Telephone Encounter (Signed)
Tried pt several times & line busy.  Pt returned call after seeing our ph number on her TV screen.  Questioned how she did post chemo yest.  She reports "so far-so good"  She has noticed some tingling in her jaw when she first starts to chew but that goes away quickly.  She also noticed the cold sensation to her hands when she washed in cold water.  Reminded about precautions for this.  She denies any fever, n/v, constipation/diarrhea.  She is also on xeloda & questions when she should call for refills for this.  Noted that she has a f/u appt with Ned Card & informed that she should discuss how she is doing on the drug at that time to determine refills  She know she can call for questions or concerns.

## 2014-01-04 ENCOUNTER — Telehealth: Payer: Self-pay | Admitting: *Deleted

## 2014-01-04 NOTE — Telephone Encounter (Signed)
Call received from [facility] asking if "she is here today".  Reportedly "patient's cousin.  I was told by here she will be receiving chemotherapy every day this week four four hours each day."  No one by this name on chemotherapy schedule.  Instructed to call this cousin and not call the treatment area ending call explaining patient confidentiality .  Now note this patient and no one by this name on release of information list.

## 2014-01-05 ENCOUNTER — Telehealth: Payer: Self-pay | Admitting: *Deleted

## 2014-01-05 NOTE — Telephone Encounter (Signed)
Received phone call from patient requesting something for her appetite.  She has not had any desire to eat.  She said she weighed at home this morning and has lost more weight.  She said she is drinking fluids, but has no appetite and due to not being able to drink cold liquids, it is even more difficult.  She said nausea meds are working, but no desire to eat.  Message sent to Dr. Gearldine Shown nurse.

## 2014-01-06 ENCOUNTER — Other Ambulatory Visit: Payer: Self-pay | Admitting: *Deleted

## 2014-01-06 ENCOUNTER — Encounter: Payer: Self-pay | Admitting: Oncology

## 2014-01-06 DIAGNOSIS — C169 Malignant neoplasm of stomach, unspecified: Secondary | ICD-10-CM

## 2014-01-06 MED ORDER — MEGESTROL ACETATE 400 MG/10ML PO SUSP: 200.0000 mg | Freq: Two times a day (BID) | ORAL | Status: DC

## 2014-01-06 NOTE — Progress Notes (Signed)
Faxed megace pa form to [facility]

## 2014-01-06 NOTE — Telephone Encounter (Signed)
RECEIVED A FAX FROM [facility] CONCERNING A PRIOR AUTHORIZATION FOR MEGESTROL. THIS REQUEST WAS PLACED IN THE MANAGED CARE BIN. 

## 2014-01-09 ENCOUNTER — Encounter: Payer: Self-pay | Admitting: Oncology

## 2014-01-09 ENCOUNTER — Encounter (INDEPENDENT_AMBULATORY_CARE_PROVIDER_SITE_OTHER): Payer: Self-pay | Admitting: General Surgery

## 2014-01-09 ENCOUNTER — Ambulatory Visit: Payer: Medicare Other

## 2014-01-09 ENCOUNTER — Other Ambulatory Visit: Payer: Medicare Other

## 2014-01-09 NOTE — Progress Notes (Signed)
[facility], [id number], approved megestrol from 01/06/14-01/07/15

## 2014-01-10 ENCOUNTER — Telehealth: Payer: Self-pay | Admitting: Nutrition

## 2014-01-10 NOTE — Telephone Encounter (Signed)
Patient called wondering if she could make smoothies using a nutrabullet blender.  Provided information on using smoothies as a supplement to a healthy diet, including a variety of fruits and vegetables, adding protein to smoothies, and limiting these to once daily.  Will mail fact sheets and recipes to patient.  Patient appreciative of information.

## 2014-01-12 ENCOUNTER — Encounter: Payer: Self-pay | Admitting: Gastroenterology

## 2014-01-12 ENCOUNTER — Other Ambulatory Visit: Payer: Self-pay | Admitting: Oncology

## 2014-01-12 DIAGNOSIS — C169 Malignant neoplasm of stomach, unspecified: Secondary | ICD-10-CM

## 2014-01-16 ENCOUNTER — Telehealth: Payer: Self-pay | Admitting: Oncology

## 2014-01-16 ENCOUNTER — Ambulatory Visit (HOSPITAL_BASED_OUTPATIENT_CLINIC_OR_DEPARTMENT_OTHER): Payer: Medicare Other | Admitting: Nurse Practitioner

## 2014-01-16 ENCOUNTER — Other Ambulatory Visit (HOSPITAL_BASED_OUTPATIENT_CLINIC_OR_DEPARTMENT_OTHER): Payer: Medicare Other

## 2014-01-16 ENCOUNTER — Ambulatory Visit: Payer: Medicare Other

## 2014-01-16 ENCOUNTER — Other Ambulatory Visit: Payer: Medicare Other

## 2014-01-16 VITALS — BP 107/68 | HR 68 | Temp 97.5°F | Resp 20 | Ht 60.5 in | Wt 119.6 lb

## 2014-01-16 DIAGNOSIS — C169 Malignant neoplasm of stomach, unspecified: Secondary | ICD-10-CM

## 2014-01-16 LAB — CBC WITH DIFFERENTIAL/PLATELET
BASO%: 0.5 % (ref 0.0–2.0)
Basophils Absolute: 0 10*3/uL (ref 0.0–0.1)
EOS%: 1.5 % (ref 0.0–7.0)
Eosinophils Absolute: 0.1 10*3/uL (ref 0.0–0.5)
HCT: 35.2 % (ref 34.8–46.6)
HGB: 11.9 g/dL (ref 11.6–15.9)
LYMPH%: 29.2 % (ref 14.0–49.7)
MCH: 29.5 pg (ref 25.1–34.0)
MCHC: 33.8 g/dL (ref 31.5–36.0)
MCV: 87.5 fL (ref 79.5–101.0)
MONO#: 0.6 10*3/uL (ref 0.1–0.9)
MONO%: 10.8 % (ref 0.0–14.0)
NEUT%: 58 % (ref 38.4–76.8)
NEUTROS ABS: 3.2 10*3/uL (ref 1.5–6.5)
Platelets: 259 10*3/uL (ref 145–400)
RBC: 4.03 10*6/uL (ref 3.70–5.45)
RDW: 13 % (ref 11.2–14.5)
WBC: 5.6 10*3/uL (ref 3.9–10.3)
lymph#: 1.6 10*3/uL (ref 0.9–3.3)

## 2014-01-16 NOTE — Progress Notes (Addendum)
  [facility] OFFICE PROGRESS NOTE   Diagnosis:  Gastric cancer.  INTERVAL HISTORY:   Lynn Morris returns as scheduled. She completed cycle 1 CAPOX beginning 01/02/2014. She had mild nausea on day 4 relieved with her home antiemetic. No vomiting. No mouth sores. No diarrhea. No hand or foot pain or redness. She notes persistent cold sensitivity in the throat and hands. The cold sensitivity is slowly improving. She denies any neuropathy symptoms in the absence of cold exposure. She notes less pain with swallowing. Appetite is better since beginning Megace.  Objective:  Vital signs in last 24 hours:  Blood pressure 107/68, pulse 68, temperature 97.5 F (36.4 C), temperature source Oral, resp. rate 20, height 5' 0.5" (1.537 m), weight 119 lb 9.6 oz (54.25 kg).    HEENT: No thrush or ulcerations. Resp: Lungs clear. Cardio: Regular cardiac rhythm. GI: Abdomen soft and nontender. No hepatomegaly. Vascular: No leg edema. Neuro: Vibratory sense mildly decreased over the fingertips per tuning fork exam.  Skin: No rash.    Lab Results:  Lab Results  Component Value Date   WBC 5.6 01/16/2014   HGB 11.9 01/16/2014   HCT 35.2 01/16/2014   MCV 87.5 01/16/2014   PLT 259 01/16/2014   NEUTROABS 3.2 01/16/2014    Imaging:  No results found.  Medications: I have reviewed the patient's current medications.  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 Cycle 1 CAPOX beginning 01/02/2014. 2. History of gastroesophageal reflux disease and peptic stricture, status post an esophageal dilatation procedure in August 2012 3. Anorexia/weight loss. Improved with Megace. 4. Solid dysphagia secondary to #1. Improved. 5. Anxiety 6. Prolonged cold sensitivity. Slowly improving.   Disposition: Lynn Morris appears stable. She is completing cycle 1 CAPOX. She will return to begin cycle 2 on 01/23/2014. If the cold sensitivity persists/worsens a dose reduction of the oxaliplatin will be considered. She will return for a followup visit on 02/06/2014. She will contact the office in the interim with any problems.  Patient seen with Dr. Benay Spice.   Ned Card ANP/GNP-BC   01/16/2014  3:05 PM  This was a shared visit with Ned Card. Lynn Morris appears to be tolerating the CAPOX well. The plan is to proceed with cycle 2 01/23/2014. She will let us know if the cold sensitivity does not improve prior to 01/23/2014.  Julieanne Manson, M.D.

## 2014-01-16 NOTE — Telephone Encounter (Signed)
Gave pt appt for lab and Md , emailed Michelle regarding chemo °

## 2014-01-18 DIAGNOSIS — F339 Major depressive disorder, recurrent, unspecified: Secondary | ICD-10-CM | POA: Insufficient documentation

## 2014-01-20 ENCOUNTER — Other Ambulatory Visit: Payer: Self-pay | Admitting: Oncology

## 2014-01-21 ENCOUNTER — Other Ambulatory Visit: Payer: Self-pay | Admitting: Oncology

## 2014-01-23 ENCOUNTER — Ambulatory Visit: Payer: Medicare Other | Admitting: Nutrition

## 2014-01-23 ENCOUNTER — Other Ambulatory Visit: Payer: Self-pay | Admitting: *Deleted

## 2014-01-23 ENCOUNTER — Ambulatory Visit (HOSPITAL_BASED_OUTPATIENT_CLINIC_OR_DEPARTMENT_OTHER): Payer: Medicare Other

## 2014-01-23 ENCOUNTER — Other Ambulatory Visit (HOSPITAL_BASED_OUTPATIENT_CLINIC_OR_DEPARTMENT_OTHER): Payer: Medicare Other

## 2014-01-23 ENCOUNTER — Other Ambulatory Visit: Payer: Medicare Other

## 2014-01-23 VITALS — BP 101/68 | HR 67 | Temp 97.4°F

## 2014-01-23 DIAGNOSIS — C169 Malignant neoplasm of stomach, unspecified: Secondary | ICD-10-CM

## 2014-01-23 DIAGNOSIS — Z5111 Encounter for antineoplastic chemotherapy: Secondary | ICD-10-CM

## 2014-01-23 LAB — COMPREHENSIVE METABOLIC PANEL (CC13)
ALBUMIN: 3.5 g/dL (ref 3.5–5.0)
ALT: 14 U/L (ref 0–55)
AST: 18 U/L (ref 5–34)
Alkaline Phosphatase: 40 U/L (ref 40–150)
Anion Gap: 5 mEq/L (ref 3–11)
BILIRUBIN TOTAL: 0.84 mg/dL (ref 0.20–1.20)
BUN: 20.8 mg/dL (ref 7.0–26.0)
CO2: 25 mEq/L (ref 22–29)
CREATININE: 0.9 mg/dL (ref 0.6–1.1)
Calcium: 9.4 mg/dL (ref 8.4–10.4)
Chloride: 109 mEq/L (ref 98–109)
GLUCOSE: 121 mg/dL (ref 70–140)
Potassium: 4.8 mEq/L (ref 3.5–5.1)
Sodium: 139 mEq/L (ref 136–145)
Total Protein: 6.4 g/dL (ref 6.4–8.3)

## 2014-01-23 LAB — CBC WITH DIFFERENTIAL/PLATELET
BASO%: 0.2 % (ref 0.0–2.0)
Basophils Absolute: 0 10*3/uL (ref 0.0–0.1)
EOS ABS: 0.1 10*3/uL (ref 0.0–0.5)
EOS%: 2.5 % (ref 0.0–7.0)
HCT: 36 % (ref 34.8–46.6)
HGB: 12.2 g/dL (ref 11.6–15.9)
LYMPH%: 28.8 % (ref 14.0–49.7)
MCH: 29.4 pg (ref 25.1–34.0)
MCHC: 33.9 g/dL (ref 31.5–36.0)
MCV: 86.7 fL (ref 79.5–101.0)
MONO#: 0.4 10*3/uL (ref 0.1–0.9)
MONO%: 10.7 % (ref 0.0–14.0)
NEUT%: 57.8 % (ref 38.4–76.8)
NEUTROS ABS: 2.3 10*3/uL (ref 1.5–6.5)
Platelets: 235 10*3/uL (ref 145–400)
RBC: 4.15 10*6/uL (ref 3.70–5.45)
RDW: 14.4 % (ref 11.2–14.5)
WBC: 4 10*3/uL (ref 3.9–10.3)
lymph#: 1.2 10*3/uL (ref 0.9–3.3)

## 2014-01-23 MED ORDER — DEXAMETHASONE SODIUM PHOSPHATE 10 MG/ML IJ SOLN
10.0000 mg | Freq: Once | INTRAMUSCULAR | Status: AC
  Administered 2014-01-23: 10 mg via INTRAVENOUS

## 2014-01-23 MED ORDER — DEXTROSE 5 % IV SOLN
Freq: Once | INTRAVENOUS | Status: AC
  Administered 2014-01-23: 12:00:00 via INTRAVENOUS

## 2014-01-23 MED ORDER — HEPARIN SOD (PORK) LOCK FLUSH 100 UNIT/ML IV SOLN
500.0000 [IU] | Freq: Once | INTRAVENOUS | Status: AC | PRN
  Administered 2014-01-23: 500 [IU]
  Filled 2014-01-23: qty 5

## 2014-01-23 MED ORDER — OXALIPLATIN CHEMO INJECTION 100 MG/20ML: 100.0000 mg/m2 | Freq: Once | INTRAVENOUS | Status: DC

## 2014-01-23 MED ORDER — ONDANSETRON 8 MG/50ML IVPB (CHCC)
8.0000 mg | Freq: Once | INTRAVENOUS | Status: AC
  Administered 2014-01-23: 8 mg via INTRAVENOUS

## 2014-01-23 MED ORDER — OXALIPLATIN CHEMO INJECTION 100 MG/20ML
100.0000 mg/m2 | Freq: Once | INTRAVENOUS | Status: AC
  Administered 2014-01-23: 150 mg via INTRAVENOUS
  Filled 2014-01-23: qty 30

## 2014-01-23 MED ORDER — SODIUM CHLORIDE 0.9 % IJ SOLN
10.0000 mL | INTRAMUSCULAR | Status: DC | PRN
  Administered 2014-01-23: 10 mL
  Filled 2014-01-23: qty 10

## 2014-01-23 MED ORDER — ONDANSETRON 8 MG/NS 50 ML IVPB
INTRAVENOUS | Status: AC
  Filled 2014-01-23: qty 8

## 2014-01-23 MED ORDER — DEXAMETHASONE SODIUM PHOSPHATE 10 MG/ML IJ SOLN
INTRAMUSCULAR | Status: AC
  Filled 2014-01-23: qty 1

## 2014-01-23 NOTE — Patient Instructions (Signed)
[facility] Discharge Instructions for Patients Receiving Chemotherapy  Today you received the following chemotherapy agents: Oxaliplatin  To help prevent nausea and vomiting after your treatment, we encourage you to take your nausea medication as prescribed.    If you develop nausea and vomiting that is not controlled by your nausea medication, call the clinic.   BELOW ARE SYMPTOMS THAT SHOULD BE REPORTED IMMEDIATELY:  *FEVER GREATER THAN 100.5 F  *CHILLS WITH OR WITHOUT FEVER  NAUSEA AND VOMITING THAT IS NOT CONTROLLED WITH YOUR NAUSEA MEDICATION  *UNUSUAL SHORTNESS OF BREATH  *UNUSUAL BRUISING OR BLEEDING  TENDERNESS IN MOUTH AND THROAT WITH OR WITHOUT PRESENCE OF ULCERS  *URINARY PROBLEMS  *BOWEL PROBLEMS  UNUSUAL RASH Items with * indicate a potential emergency and should be followed up as soon as possible.  Feel free to call the clinic you have any questions or concerns. The clinic phone number is [phone].    

## 2014-01-23 NOTE — Progress Notes (Signed)
Followup completed with patient in chemotherapy.  Weight remained stable and was documented as 119.6 pounds.  Patient is attempting to make smoothies.  Reports improved appetite.  Nutrition diagnosis: Unintended weight loss improved.  Intervention: Educated patient on the importance of continued adequate calories and protein for weight maintenance.  Educated patient on [place].  Questions were answered.  Teach back method used.  Monitoring, evaluation, goals: Patient is tolerating adequate calories and protein for continued weight maintenance.  Next visit: Will continue to work with patient as needed.    **Disclaimer: This note was dictated with voice recognition software. Similar sounding words can inadvertently be transcribed and this note may contain transcription errors which may not have been corrected upon publication of note.**

## 2014-01-23 NOTE — Telephone Encounter (Signed)
THIS REFILL REQUEST FOR CAPECITABINE WAS GIVEN TO DR.SHERRILL'S NURSE, TANYA WHITLOCK,RN. 

## 2014-01-24 ENCOUNTER — Other Ambulatory Visit: Payer: Self-pay | Admitting: *Deleted

## 2014-01-30 ENCOUNTER — Ambulatory Visit: Payer: Medicare Other

## 2014-01-30 ENCOUNTER — Other Ambulatory Visit: Payer: Medicare Other

## 2014-02-06 ENCOUNTER — Other Ambulatory Visit (HOSPITAL_BASED_OUTPATIENT_CLINIC_OR_DEPARTMENT_OTHER): Payer: Medicare Other

## 2014-02-06 ENCOUNTER — Telehealth: Payer: Self-pay | Admitting: *Deleted

## 2014-02-06 ENCOUNTER — Telehealth: Payer: Self-pay | Admitting: Oncology

## 2014-02-06 ENCOUNTER — Ambulatory Visit (HOSPITAL_BASED_OUTPATIENT_CLINIC_OR_DEPARTMENT_OTHER): Payer: Medicare Other | Admitting: Nurse Practitioner

## 2014-02-06 VITALS — BP 102/67 | HR 78 | Temp 97.7°F | Resp 20 | Ht 60.5 in | Wt 115.1 lb

## 2014-02-06 DIAGNOSIS — C169 Malignant neoplasm of stomach, unspecified: Secondary | ICD-10-CM

## 2014-02-06 DIAGNOSIS — G620 Drug-induced polyneuropathy: Secondary | ICD-10-CM

## 2014-02-06 DIAGNOSIS — R634 Abnormal weight loss: Secondary | ICD-10-CM

## 2014-02-06 DIAGNOSIS — T451X5A Adverse effect of antineoplastic and immunosuppressive drugs, initial encounter: Secondary | ICD-10-CM

## 2014-02-06 LAB — CBC WITH DIFFERENTIAL/PLATELET
BASO%: 0.2 % (ref 0.0–2.0)
BASOS ABS: 0 10*3/uL (ref 0.0–0.1)
EOS%: 5.8 % (ref 0.0–7.0)
Eosinophils Absolute: 0.4 10*3/uL (ref 0.0–0.5)
HCT: 34.7 % — ABNORMAL LOW (ref 34.8–46.6)
HEMOGLOBIN: 11.8 g/dL (ref 11.6–15.9)
LYMPH#: 1.2 10*3/uL (ref 0.9–3.3)
LYMPH%: 18.9 % (ref 14.0–49.7)
MCH: 30.5 pg (ref 25.1–34.0)
MCHC: 34.1 g/dL (ref 31.5–36.0)
MCV: 89.3 fL (ref 79.5–101.0)
MONO#: 0.6 10*3/uL (ref 0.1–0.9)
MONO%: 9.2 % (ref 0.0–14.0)
NEUT#: 4.2 10*3/uL (ref 1.5–6.5)
NEUT%: 65.9 % (ref 38.4–76.8)
Platelets: 173 10*3/uL (ref 145–400)
RBC: 3.89 10*6/uL (ref 3.70–5.45)
RDW: 12.9 % (ref 11.2–14.5)
WBC: 6.3 10*3/uL (ref 3.9–10.3)

## 2014-02-06 NOTE — Telephone Encounter (Signed)
Pt confirmed labs/ov per 07/20 POF, gave pt AVS and will call pt back about referrals.Marland Kitchen..KJ

## 2014-02-06 NOTE — Telephone Encounter (Signed)
Per staff message and POF I have scheduled appts. Advised scheduler of appts. Advised scheduler to move lab appt  JMW

## 2014-02-06 NOTE — Progress Notes (Signed)
  [facility] OFFICE PROGRESS NOTE   Diagnosis:  Gastric cancer.  INTERVAL HISTORY:   Lynn Morris returns as scheduled. She completed cycle 2 CAPOX beginning 01/23/2014. She denies nausea/vomiting. No mouth sores. She had loose stools for a few days which she thinks was diet related. She has mild persistent cold sensitivity at the mouth and hands and feet. The cold sensitivity is less than with the previous cycle. No persistent neuropathy symptoms. No hand pain or redness. She notes that her feet are "sore" and red. She denies any skin breakdown. She denies dysphagia and odynophagia. She discontinued Megace after her appetite improved. She reports losing some weight. She plans to resume Megace.  Objective:  Vital signs in last 24 hours:  Blood pressure 102/67, pulse 78, temperature 97.7 F (36.5 C), temperature source Oral, resp. rate 20, height 5' 0.5" (1.537 m), weight 115 lb 1.6 oz (52.209 kg), SpO2 100.00%.    HEENT: No thrush or ulcerations. Resp: Lungs clear. Cardio: Regular cardiac rhythm. GI: Abdomen soft and nontender. No hepatomegaly. No mass. Vascular: No leg edema. Neuro: Vibratory sense intact over the fingertips per tuning fork exam.  Skin: Palms and soles with mild erythema. No skin breakdown.    Lab Results:  Lab Results  Component Value Date   WBC 6.3 02/06/2014   HGB 11.8 02/06/2014   HCT 34.7* 02/06/2014   MCV 89.3 02/06/2014   PLT 173 02/06/2014   NEUTROABS 4.2 02/06/2014    Imaging:  No results found.  Medications: I have reviewed the patient's current medications.  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  Cycle 1 CAPOX beginning 01/02/2014. Cycle 2 CAPOX beginning 01/23/2014. 2. History of gastroesophageal reflux disease and peptic stricture, status post an esophageal dilatation procedure in August 2012 3. Anorexia/weight loss. Improved with Megace. 4. Solid dysphagia secondary to #1. Improved. 5. Anxiety. 6. Prolonged cold sensitivity following cycle 1. Improved with cycle 2. 7. Foot pain and redness secondary to Xeloda.   Disposition: Lynn Morris appears stable. She has completed 2 cycles of CAPOX. She has developed hand-foot syndrome related to Xeloda. Dr. Benay Spice recommends placing the Xeloda on hold for one week with plans to resume on 02/13/2014 if the foot pain is better.  She will return to begin cycle 3 CAPOX on 02/13/2014. We made referrals back to Dr. Barry Dienes and Dr. Servando Snare for surgery planning.  She will return for a followup visit here on 02/22/2014. She will contact the office in the interim with any problems.  Plan reviewed with Dr. Benay Spice.    Ned Card ANP/GNP-BC   02/06/2014  9:27 AM

## 2014-02-08 ENCOUNTER — Telehealth: Payer: Self-pay | Admitting: *Deleted

## 2014-02-08 NOTE — Telephone Encounter (Signed)
Received message from pt reporting "ever since I stopped chemo pill Monday, I've had diarrhea."  Called and spoke with pt; states she started taking Imodium for the diarrhea; had 3 episodes diarrhea yesterday and 1 episode today and will continue with the Imodium.  Instructed pt to continue to take Imodium as directed after each episode and drink plenty of fluids to stay hydrated.  Pt verbalized understanding and confirmed appt for 02/13/14.  Pt will call office is symptoms worsen.

## 2014-02-09 ENCOUNTER — Telehealth: Payer: Self-pay | Admitting: Oncology

## 2014-02-09 NOTE — Telephone Encounter (Signed)
Lft msg per referral apts, mailed out updated schedule.Marland Kitchen..KJ

## 2014-02-12 ENCOUNTER — Other Ambulatory Visit: Payer: Self-pay | Admitting: Oncology

## 2014-02-13 ENCOUNTER — Other Ambulatory Visit (HOSPITAL_BASED_OUTPATIENT_CLINIC_OR_DEPARTMENT_OTHER): Payer: Medicare Other

## 2014-02-13 ENCOUNTER — Ambulatory Visit (HOSPITAL_BASED_OUTPATIENT_CLINIC_OR_DEPARTMENT_OTHER): Payer: Medicare Other

## 2014-02-13 VITALS — BP 115/71 | HR 66 | Temp 97.4°F

## 2014-02-13 DIAGNOSIS — C169 Malignant neoplasm of stomach, unspecified: Secondary | ICD-10-CM

## 2014-02-13 DIAGNOSIS — Z5111 Encounter for antineoplastic chemotherapy: Secondary | ICD-10-CM

## 2014-02-13 LAB — CBC WITH DIFFERENTIAL/PLATELET
BASO%: 1 % (ref 0.0–2.0)
Basophils Absolute: 0.1 10*3/uL (ref 0.0–0.1)
EOS ABS: 0.8 10*3/uL — AB (ref 0.0–0.5)
EOS%: 14.3 % — ABNORMAL HIGH (ref 0.0–7.0)
HEMATOCRIT: 35 % (ref 34.8–46.6)
HEMOGLOBIN: 11.9 g/dL (ref 11.6–15.9)
LYMPH%: 31.2 % (ref 14.0–49.7)
MCH: 31.1 pg (ref 25.1–34.0)
MCHC: 33.9 g/dL (ref 31.5–36.0)
MCV: 91.9 fL (ref 79.5–101.0)
MONO#: 0.7 10*3/uL (ref 0.1–0.9)
MONO%: 12.7 % (ref 0.0–14.0)
NEUT#: 2.3 10*3/uL (ref 1.5–6.5)
NEUT%: 40.8 % (ref 38.4–76.8)
Platelets: 239 10*3/uL (ref 145–400)
RBC: 3.81 10*6/uL (ref 3.70–5.45)
WBC: 5.7 10*3/uL (ref 3.9–10.3)
lymph#: 1.8 10*3/uL (ref 0.9–3.3)

## 2014-02-13 LAB — COMPREHENSIVE METABOLIC PANEL (CC13)
ALT: 21 U/L (ref 0–55)
AST: 26 U/L (ref 5–34)
Albumin: 3.1 g/dL — ABNORMAL LOW (ref 3.5–5.0)
Alkaline Phosphatase: 46 U/L (ref 40–150)
Anion Gap: 6 mEq/L (ref 3–11)
BILIRUBIN TOTAL: 0.54 mg/dL (ref 0.20–1.20)
BUN: 11.9 mg/dL (ref 7.0–26.0)
CO2: 26 meq/L (ref 22–29)
CREATININE: 0.8 mg/dL (ref 0.6–1.1)
Calcium: 8.9 mg/dL (ref 8.4–10.4)
Chloride: 111 mEq/L — ABNORMAL HIGH (ref 98–109)
GLUCOSE: 80 mg/dL (ref 70–140)
Potassium: 4.2 mEq/L (ref 3.5–5.1)
Sodium: 143 mEq/L (ref 136–145)
Total Protein: 5.9 g/dL — ABNORMAL LOW (ref 6.4–8.3)

## 2014-02-13 MED ORDER — DEXAMETHASONE SODIUM PHOSPHATE 10 MG/ML IJ SOLN
10.0000 mg | Freq: Once | INTRAMUSCULAR | Status: AC
  Administered 2014-02-13: 10 mg via INTRAVENOUS

## 2014-02-13 MED ORDER — ONDANSETRON 8 MG/NS 50 ML IVPB
INTRAVENOUS | Status: AC
  Filled 2014-02-13: qty 8

## 2014-02-13 MED ORDER — SODIUM CHLORIDE 0.9 % IJ SOLN
10.0000 mL | INTRAMUSCULAR | Status: DC | PRN
  Filled 2014-02-13: qty 10

## 2014-02-13 MED ORDER — DEXAMETHASONE SODIUM PHOSPHATE 10 MG/ML IJ SOLN
INTRAMUSCULAR | Status: AC
  Filled 2014-02-13: qty 1

## 2014-02-13 MED ORDER — OXALIPLATIN CHEMO INJECTION 100 MG/20ML
100.0000 mg/m2 | Freq: Once | INTRAVENOUS | Status: AC
  Administered 2014-02-13: 150 mg via INTRAVENOUS
  Filled 2014-02-13: qty 30

## 2014-02-13 MED ORDER — ONDANSETRON 8 MG/50ML IVPB (CHCC)
8.0000 mg | Freq: Once | INTRAVENOUS | Status: AC
  Administered 2014-02-13: 8 mg via INTRAVENOUS

## 2014-02-13 MED ORDER — DEXTROSE 5 % IV SOLN
Freq: Once | INTRAVENOUS | Status: AC
  Administered 2014-02-13: 14:00:00 via INTRAVENOUS

## 2014-02-13 MED ORDER — HEPARIN SOD (PORK) LOCK FLUSH 100 UNIT/ML IV SOLN
500.0000 [IU] | Freq: Once | INTRAVENOUS | Status: DC | PRN
  Filled 2014-02-13: qty 5

## 2014-02-13 NOTE — Patient Instructions (Signed)
[facility] Discharge Instructions for Patients Receiving Chemotherapy  Today you received the following chemotherapy agents Oxaliplatin.  To help prevent nausea and vomiting after your treatment, we encourage you to take your nausea medication.   If you develop nausea and vomiting that is not controlled by your nausea medication, call the clinic.   BELOW ARE SYMPTOMS THAT SHOULD BE REPORTED IMMEDIATELY:  *FEVER GREATER THAN 100.5 F  *CHILLS WITH OR WITHOUT FEVER  NAUSEA AND VOMITING THAT IS NOT CONTROLLED WITH YOUR NAUSEA MEDICATION  *UNUSUAL SHORTNESS OF BREATH  *UNUSUAL BRUISING OR BLEEDING  TENDERNESS IN MOUTH AND THROAT WITH OR WITHOUT PRESENCE OF ULCERS  *URINARY PROBLEMS  *BOWEL PROBLEMS  UNUSUAL RASH Items with * indicate a potential emergency and should be followed up as soon as possible.  Feel free to call the clinic you have any questions or concerns. The clinic phone number is [phone].    

## 2014-02-22 ENCOUNTER — Other Ambulatory Visit (HOSPITAL_BASED_OUTPATIENT_CLINIC_OR_DEPARTMENT_OTHER): Payer: Medicare Other

## 2014-02-22 ENCOUNTER — Telehealth: Payer: Self-pay | Admitting: Oncology

## 2014-02-22 ENCOUNTER — Ambulatory Visit (HOSPITAL_BASED_OUTPATIENT_CLINIC_OR_DEPARTMENT_OTHER): Payer: Medicare Other | Admitting: Oncology

## 2014-02-22 VITALS — BP 115/66 | HR 68 | Temp 97.5°F | Resp 18 | Ht 60.5 in | Wt 112.3 lb

## 2014-02-22 DIAGNOSIS — C169 Malignant neoplasm of stomach, unspecified: Secondary | ICD-10-CM

## 2014-02-22 LAB — CBC WITH DIFFERENTIAL/PLATELET
BASO%: 0.9 % (ref 0.0–2.0)
Basophils Absolute: 0 10*3/uL (ref 0.0–0.1)
EOS ABS: 0.4 10*3/uL (ref 0.0–0.5)
EOS%: 8.7 % — ABNORMAL HIGH (ref 0.0–7.0)
HEMATOCRIT: 34.8 % (ref 34.8–46.6)
HGB: 11.7 g/dL (ref 11.6–15.9)
LYMPH%: 31.5 % (ref 14.0–49.7)
MCH: 31.4 pg (ref 25.1–34.0)
MCHC: 33.7 g/dL (ref 31.5–36.0)
MCV: 93.2 fL (ref 79.5–101.0)
MONO#: 0.6 10*3/uL (ref 0.1–0.9)
MONO%: 13 % (ref 0.0–14.0)
NEUT%: 45.9 % (ref 38.4–76.8)
NEUTROS ABS: 2 10*3/uL (ref 1.5–6.5)
Platelets: 200 10*3/uL (ref 145–400)
RBC: 3.73 10*6/uL (ref 3.70–5.45)
RDW: 20.3 % — ABNORMAL HIGH (ref 11.2–14.5)
WBC: 4.4 10*3/uL (ref 3.9–10.3)
lymph#: 1.4 10*3/uL (ref 0.9–3.3)

## 2014-02-22 NOTE — Progress Notes (Signed)
  [facility] OFFICE PROGRESS NOTE   Diagnosis: Gastric cancer  INTERVAL HISTORY:   The hand and foot symptoms improved when she felt Xeloda for one week last month. She completed cycle 3 CAPOX beginning on 02/13/2014. She will complete the planned course of Xeloda on 02/26/2014. No mouth sores, nausea, or diarrhea. No dysphagia. Mild cold sensitivity and tingling in the fingers and feet. This does not interfere with activity. She is no longer taking Megace.  Objective:  Vital signs in last 24 hours:  Blood pressure 115/66, pulse 68, temperature 97.5 F (36.4 C), temperature source Oral, resp. rate 18, height 5' 0.5" (1.537 m), weight 112 lb 4.8 oz (50.939 kg), SpO2 99.00%.    HEENT: No thrush or ulcers Lymphatics: No cervical, supraclavicular, or axillary nodes Resp: Lungs clear bilaterally Cardio: Regular rate and rhythm GI: No hepatomegaly, nontender, no mass Vascular: No leg edema Neuro: The vibratory sense is intact at the fingertips bilaterally  Skin: Mild hyperpigmentation at the hands and feet. Dryness and skin thickening at the soles. No erythema.   Portacath/PICC-without erythema  Lab Results:  Lab Results  Component Value Date   WBC 4.4 02/22/2014   HGB 11.7 02/22/2014   HCT 34.8 02/22/2014   MCV 93.2 02/22/2014   PLT 200 02/22/2014   NEUTROABS 2.0 02/22/2014     Medications: I have reviewed the patient's current medications.  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  Cycle 1 CAPOX beginning 01/02/2014.  Cycle 2 CAPOX beginning 01/23/2014. Cycle 3 CAPOX beginning 02/13/2014 2. History of gastroesophageal reflux disease and peptic stricture, status post an esophageal dilatation procedure in August 2012 3. Anorexia/weight loss.  4. Solid dysphagia secondary to #1. Improved. 5. Anxiety. 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 Foot pain and redness secondary to Xeloda. Improved.   Disposition:  She will complete 2 weeks of Xeloda with cycle 3 CAPOX on 02/26/2014. She will then discontinue Xeloda. Ms. Cheek is scheduled to see Dr.s Barry Dienes and Servando Snare over the next week for surgical planning. I will defer repeat imaging to them.  She will return for an office visit and Port-A-Cath flush in approximately 6 weeks. We can adjust the followup date based on the timing of surgery.  She feels the anorexia and weight loss will improve when she discontinues Xeloda.    Betsy Coder, MD  02/22/2014  12:32 PM

## 2014-02-22 NOTE — Telephone Encounter (Signed)
Pt confirmed labs/ov per 08/05 POF, gave pt AVS....KJ °

## 2014-02-22 NOTE — Progress Notes (Signed)
Met with patient during MD visit.  She is feeling much better with an improved appetite.  She is anxious about upcoming surgery.  She denies needs or barriers to care at present time.  Will continue to follow as needed. 

## 2014-02-23 ENCOUNTER — Telehealth (INDEPENDENT_AMBULATORY_CARE_PROVIDER_SITE_OTHER): Payer: Self-pay | Admitting: General Surgery

## 2014-02-23 NOTE — Telephone Encounter (Signed)
LMOM letting pt know we changed her appt time from 1200 to 11:30 with an arrival time of 11:15.

## 2014-02-24 ENCOUNTER — Other Ambulatory Visit (INDEPENDENT_AMBULATORY_CARE_PROVIDER_SITE_OTHER): Payer: Self-pay | Admitting: General Surgery

## 2014-02-24 ENCOUNTER — Ambulatory Visit (INDEPENDENT_AMBULATORY_CARE_PROVIDER_SITE_OTHER): Payer: Medicare Other | Admitting: General Surgery

## 2014-02-24 ENCOUNTER — Encounter (INDEPENDENT_AMBULATORY_CARE_PROVIDER_SITE_OTHER): Payer: Self-pay | Admitting: General Surgery

## 2014-02-24 VITALS — BP 120/60 | HR 68 | Temp 98.3°F | Resp 16 | Ht 60.5 in | Wt 112.2 lb

## 2014-02-24 DIAGNOSIS — C169 Malignant neoplasm of stomach, unspecified: Secondary | ICD-10-CM

## 2014-02-24 NOTE — Patient Instructions (Signed)
We will order pet scan  Will get surgery date.  Will definitely need feeding tube Main risks of surgery are bleeding, infection, leak, damage to adjacent structures, potential for further procedures or surgeries, heart or lung problems, blood clot, death, recurrent cancer.  There is a risk of aborting surgery if we find spread of disease.

## 2014-02-24 NOTE — Progress Notes (Deleted)
orders

## 2014-02-25 NOTE — Assessment & Plan Note (Signed)
I will order a repeat PET scan for staging post treatment.  Hopefully she will have had a good response to treatment and will not need as extensive surgery.  Assessed preoperatively, she would have needed total gastrectomy with at least 3-4 cm esophagus resected with esophagojejunostomy.  I hope to avoid total gastrectomy if at all possible.  I counseled the patient regarding total gastrectomy and implications for diet.  I discussed that our main goal was to achieve a complete resection of all of the cancer.  She will need to eat frequent small meals like gastric bypass patient.    She is to see Dr. Servando Snare next week in case he needs to be available for surgery.  Will make final determination after imagine.    Discussed risks extensively including bleeding, infection, damage to adjacent structures, leak and need for other procedures/surgery, need for feeding tube, feeding tube complications, heart or lung problems, blood clot, death, recurrent cancer, possible abortion of procedure if carcinomatosis is found, and more.  She understands and wishes to proceed.    We will set up case in conjunction with Dr. Servando Snare, assuming he will need be available, and depending on imaging, he may be able to drop out.    45 min spent with patient and husband.

## 2014-02-25 NOTE — Progress Notes (Signed)
HISTORY: Pt is a 73 yo F in great shape who has gastric cancer extending into esophagus.  She has been undergoing chemotherapy and doing well overall.  She has had minimal side effects.  She has continued to lose some weight, but only around 3 pounds in the last month.  It has mostly stabilized.  She denies abdominal pain or nausea.  She does still have a decreased appetite.  She denies bloody stools.  She is walking and trying to stay strong.     PERTINENT REVIEW OF SYSTEMS: Otherwise negative x 11.    Filed Vitals:   02/24/14 1157  BP: 120/60  Pulse: 68  Temp: 98.3 F (36.8 C)  Resp: 16   Wt Readings from Last 3 Encounters:  02/24/14 112 lb 3.2 oz (50.894 kg)  02/22/14 112 lb 4.8 oz (50.939 kg)  02/06/14 115 lb 1.6 oz (52.209 kg)    EXAM: Head: Normocephalic and atraumatic.  Eyes:  Conjunctivae are normal. Pupils are equal, round, and reactive to light. No scleral icterus.  Neck:  Normal range of motion. Neck supple. No tracheal deviation present. No thyromegaly present.  Resp: No respiratory distress, normal effort. Abd:  Abdomen is soft, non distended and non tender. No masses are palpable.  There is no rebound and no guarding.  Neurological: Alert and oriented to person, place, and time. Coordination normal.  Skin: Skin is warm and dry. No rash noted. No diaphoretic. No erythema. No pallor.  Psychiatric: Normal mood and affect. Normal behavior. Judgment and thought content normal.      ASSESSMENT AND PLAN:   Gastric cancer I will order a repeat PET scan for staging post treatment.  Hopefully she will have had a good response to treatment and will not need as extensive surgery.  Assessed preoperatively, she would have needed total gastrectomy with at least 3-4 cm esophagus resected with esophagojejunostomy.  I hope to avoid total gastrectomy if at all possible.  I counseled the patient regarding total gastrectomy and implications for diet.  I discussed that our main goal was  to achieve a complete resection of all of the cancer.  She will need to eat frequent small meals like gastric bypass patient.    She is to see Dr. Servando Snare next week in case he needs to be available for surgery.  Will make final determination after imagine.    Discussed risks extensively including bleeding, infection, damage to adjacent structures, leak and need for other procedures/surgery, need for feeding tube, feeding tube complications, heart or lung problems, blood clot, death, recurrent cancer, possible abortion of procedure if carcinomatosis is found, and more.  She understands and wishes to proceed.    We will set up case in conjunction with Dr. Servando Snare, assuming he will need be available, and depending on imaging, he may be able to drop out.    45 min spent with patient and husband.        Milus Height, MD Surgical Oncology, [facility]  Leamon Arnt, MD Leamon Arnt, MD  Benay Spice, MD Servando Snare, MD

## 2014-02-27 ENCOUNTER — Encounter (HOSPITAL_COMMUNITY): Payer: Self-pay

## 2014-02-27 ENCOUNTER — Ambulatory Visit (HOSPITAL_COMMUNITY)
Admission: RE | Admit: 2014-02-27 | Discharge: 2014-02-27 | Disposition: A | Discharge status: Home / Self Care | Payer: Medicare Other | Source: Ambulatory Visit | Attending: Diagnostic Radiology | Admitting: Diagnostic Radiology

## 2014-02-27 DIAGNOSIS — R599 Enlarged lymph nodes, unspecified: Secondary | ICD-10-CM | POA: Diagnosis not present

## 2014-02-27 DIAGNOSIS — C169 Malignant neoplasm of stomach, unspecified: Secondary | ICD-10-CM | POA: Insufficient documentation

## 2014-02-27 DIAGNOSIS — I2510 Atherosclerotic heart disease of native coronary artery without angina pectoris: Secondary | ICD-10-CM | POA: Diagnosis not present

## 2014-02-27 LAB — GLUCOSE, CAPILLARY: GLUCOSE-CAPILLARY: 95 mg/dL (ref 70–99)

## 2014-02-27 MED ORDER — FLUDEOXYGLUCOSE F - 18 (FDG) INJECTION: 6.5000 | Freq: Once | INTRAVENOUS | Status: AC | PRN

## 2014-03-02 ENCOUNTER — Encounter: Payer: Self-pay | Admitting: Cardiothoracic Surgery

## 2014-03-02 ENCOUNTER — Telehealth: Payer: Self-pay | Admitting: Oncology

## 2014-03-02 ENCOUNTER — Ambulatory Visit (INDEPENDENT_AMBULATORY_CARE_PROVIDER_SITE_OTHER): Payer: Medicare Other | Admitting: Cardiothoracic Surgery

## 2014-03-02 VITALS — BP 101/71 | HR 78 | Resp 20 | Ht 65.0 in | Wt 112.0 lb

## 2014-03-02 DIAGNOSIS — C169 Malignant neoplasm of stomach, unspecified: Secondary | ICD-10-CM

## 2014-03-02 NOTE — Progress Notes (Signed)
[address]       [place] [address]             [phone]                    [name] [facility] Medical Record #[id number] Date of Birth: October 29, 1940  Referring: Ladell Pier, MD Primary Care: Leamon Arnt, MD  Chief Complaint:    Chief Complaint  Patient presents with  . Follow-up    further discuss surgery, completed chemo    History of Present Illness:    Lynn Morris 73 y.o. 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 The patient has a long history of acid reflux and esophageal strictures in the past which have been dilated. In late summer 2014 she had some trouble swallowing and has noticed a 35 pound weight loss over the last 6-8 months.  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    Patient completed cycle 3 CAPOX beginning on 02/13/2014. She will complete the planned course of Xeloda on 02/27/19. The patient tolerated treatment very well losing only approximately 3 pounds. She was bothered by hand and foot symptoms. She notes her appetite is improving since stopping Xeloda.  Currently the patient has no difficulty swallowing and is taking a by mouth diet relatively well.   Followup PET scan was performed several days ago.   Current Activity/ Functional Status:  Patient is independent with mobility/ambulation, transfers, ADL's, IADL's.   Zubrod Score: At the time of surgery this patient's most appropriate activity status/level should be described as: [x]     0    Normal activity, no symptoms []     1    Restricted in physical strenuous activity but ambulatory, able to do out light work []     2    Ambulatory and capable of self care, unable to do work activities, up and about               >50 % of waking hours                              []      3    Only limited self care, in bed greater than 50% of waking hours []     4    Completely disabled, no self care, confined to bed or chair []     5    Moribund   Past Medical History  Diagnosis Date  . Esophageal reflux   . Esophageal stricture   . Depression   . Anxiety   . Hyperlipemia   . Allergy     SEASONAL  . Arthritis     LG TOE  . Cataract     BEGINNING RT.EYE  . Hx of colonic polyps   . Complication of anesthesia   . PONV (postoperative nausea and vomiting)   . Hiatal hernia 12-13-13    intermittent nausea with vomiting  . Cancer 11/24/13    gastroesophageal carcinoma  . History of blood transfusion     Past Surgical History  Procedure Laterality Date  . Abdominal hysterectomy  1978    Partial   . Cesarean section  1964 & 1966  . Nose surgery  1970  . Appendectomy    .  Abdominal hysterectomy    . Eus N/A 12/22/2013    Procedure: UPPER ENDOSCOPIC ULTRASOUND (EUS) LINEAR;  Surgeon: Milus Banister, MD;  Location: [facility];  Service: Endoscopy;  Laterality: N/A;  . Portacath placement N/A 12/27/2013    Procedure: INSERTION PORT-A-CATH;  Surgeon: Stark Klein, MD;  Location: [facility] OR;  Service: General;  Laterality: N/A;    Family History  Problem Relation Age of Onset  . Diabetes Mother   . Hypertension Mother   . Pancreatic cancer Mother   . Colon cancer Neg Hx     History   Social History  . Marital Status: Married    Spouse Name: N/A    Number of Children: 2  . Years of Education: N/A   Occupational History  . Retired    Social History Main Topics  . Smoking status: Never Smoker   . Smokeless tobacco: Never Used  . Alcohol Use: No  . Drug Use: No  .      Social History Narrative   2 caffeine drinks daily     History  Smoking status  . Never Smoker   Smokeless tobacco  . Never Used    History  Alcohol Use No     Allergies  Allergen Reactions  . Codeine     Cant remember    Current Outpatient Prescriptions  Medication Sig  Dispense Refill  . Calcium Carbonate-Vit D-Min (CALCIUM 1200 PO) Take 2 tablets by mouth daily.       . Cholecalciferol (VITAMIN D3) 2000 UNITS TABS Take 1 tablet by mouth.      Marland Kitchen ibuprofen (ADVIL,MOTRIN) 200 MG tablet Take 200 mg by mouth every 6 (six) hours as needed for moderate pain.       . Multiple Vitamin (MULTIVITAMIN WITH MINERALS) TABS tablet Take 1 tablet by mouth daily.      . sertraline (ZOLOFT) 100 MG tablet Take 100 mg by mouth every other day.       . vitamin B-12 (CYANOCOBALAMIN) 500 MCG tablet Take 500 mcg by mouth daily.       No current facility-administered medications for this visit.     Review of Systems:     Cardiac Review of Systems: Y or N  Chest Pain [  n  ]  Resting SOB [  n ] Exertional SOB  [n  ]  Orthopnea [n  ]   Pedal Edema Florencio.Farrier   ]    Palpitations Florencio.Farrier  ] Syncope  [ n ]   Presyncope [  n ]  General Review of Systems: [Y] = yes [  ]=no Constitional: recent weight change [y 36 lbs over 6 months  ];  Wt loss over the last 3 months [   ] anorexia Blue.Reese  ]; fatigue [ n ]; nausea [n ]; night sweats [ n ]; fever [n  ]; or chills [ n ];          Dental: poor dentition[n  ]; Last Dentist visit:   Eye : blurred vision [  ]; diplopia [   ]; vision changes [  ];  Amaurosis fugax[  ]; Resp: cough [n  ];  wheezing[ n ];  hemoptysis[n  ]; shortness of breath[ n ]; paroxysmal nocturnal dyspnea[  ]; dyspnea on exertion[  ]; or orthopnea[  ];  GI:  gallstones[ n ], vomiting[n  ];  dysphagia[y  ]; melena[n  ];  hematochezia [ n ]; heartburn[  y];   Hx of  Colonoscopy[y  ];  GU: kidney stones [  ]; hematuria[  n];   dysuria [  ];  nocturia[  ];  history of     obstruction [n  ]; urinary frequency [  ]             Skin: rash, swelling[  ];, hair loss[  ];  peripheral edema[n  ];  or itching[  ]; Musculosketetal: myalgias[  ];  joint swelling[n  ];  joint erythema[ n ];  joint pain[  ];  back pain[  ];  Heme/Lymph: bruising[  ];  bleeding[  ];  anemia[  ];  Neuro: TIA[ n ];  headaches[   ];  stroke[n  ];  vertigo[  ];  seizures[n  ];   paresthesias[  ];  difficulty walking[ n ];  Psych:depression[  ]; anxiety[  ];  Endocrine: diabetes[ n ];  thyroid dysfunction[ n ];  Immunizations: Flu up to date Blue.Reese  ]; Pneumococcal up to date [ y ];  Other:  Physical Exam: BP 101/71  Pulse 78  Resp 20  Ht 5\' 5"  (1.651 m)  Wt 112 lb (50.803 kg)  BMI 18.64 kg/m2  SpO2 96%  PHYSICAL EXAMINATION:  General appearance: alert, cooperative, appears older than stated age and no distress Neurologic: intact Heart: regular rate and rhythm, S1, S2 normal, no murmur, click, rub or gallop Lungs: clear to auscultation bilaterally and normal percussion bilaterally Abdomen: normal findings: aorta normal, bowel sounds normal, liver span normal to percussion, no bruits heard and no scars, striae, dilated veins, rashes, or lesions and abnormal findings:  Extremities: extremities normal, atraumatic, no cyanosis or edema, Homans sign is negative, no sign of DVT, no edema, redness or tenderness in the calves or thighs and no ulcers, gangrene or trophic changes Patient has no cervical or supraclavicular or axillary adenopathy, there are no carotid bruits pedal pulses PT and DP are full bilaterally   Diagnostic Studies & Laboratory data:     Recent Radiology Findings:  Nm Pet Image Restag (ps) Skull Base To Thigh  02/27/2014   CLINICAL DATA:  Subsequent treatment strategy for gastric cancer.  EXAM: NUCLEAR MEDICINE PET SKULL BASE TO THIGH  TECHNIQUE: 6.5 mCi F-18 FDG was injected intravenously. Full-ring PET imaging was performed from the skull base to thigh after the radiotracer. CT data was obtained and used for attenuation correction and anatomic localization.  FASTING BLOOD GLUCOSE:  Value: 95 mg/dl  COMPARISON:  12/23/2013 and CT chest abdomen pelvis 11/28/2013.  FINDINGS: NECK  No hypermetabolic lymph nodes in the neck. CT images show no acute findings.  CHEST  There is mild hypermetabolism in the hilar  regions, with an SUV max on the right of 4.9. Discrete adenopathy is not well seen without IV contrast. No hypermetabolic axillary lymph nodes. No hypermetabolic pulmonary nodules. Previously seen mild hypermetabolism associated with the distal esophagus is not readily identified.  CT images show a left IJ Port-A-Cath terminating in the SVC. Coronary artery calcification. Heart size normal. No pericardial or pleural effusion. Given respiratory motion, lungs are clear.  ABDOMEN/PELVIS  No abnormal hypermetabolism in the liver, adrenal glands, spleen or pancreas. There is very mild uptake in the stomach, without a focal lesion or uptake above blood pool. No hypermetabolic lymph nodes.  CT images show the liver, gallbladder, adrenal glands, kidneys, spleen, pancreas, stomach and bowel to be grossly unremarkable. Scattered atherosclerotic calcification of the arterial vasculature without abdominal aortic aneurysm. Trace pelvic free fluid.  SKELETON  No abnormal osseous hypermetabolism.  IMPRESSION:  1. Uptake within the proximal stomach does not appear greater than blood pool. Previously seen distal esophageal uptake is not readily identified. 2. Mild hypermetabolism in the hilar regions. Discrete adenopathy is not well seen on CT. Findings are similar to the prior exam. 3. Coronary artery calcification.   Electronically Signed   By: Lorin Picket M.D.   On: 02/27/2014 15:17  Ct Chest W Contrast/Ct Abdomen Pelvis W Contrast  11/28/2013   CLINICAL DATA:  Proximal gastric mass.  EXAM: CT CHEST, ABDOMEN, AND PELVIS WITH CONTRAST  TECHNIQUE: Multidetector CT imaging of the chest, abdomen and pelvis was performed following the standard protocol during bolus administration of intravenous contrast.  CONTRAST:  49mL OMNIPAQUE IOHEXOL 300 MG/ML  SOLN  COMPARISON:  No priors.  FINDINGS: CT CHEST FINDINGS  Mediastinum: Heart size is normal. There is no significant pericardial fluid, thickening or pericardial calcification. There  is atherosclerosis of the thoracic aorta, the great vessels of the mediastinum and the coronary arteries, including calcified atherosclerotic plaque in the left anterior descending coronary arteries. No pathologically enlarged mediastinal or hilar lymph nodes. Small hiatal hernia with some asymmetric thickening of the distal esophagus posteriorly.  Lungs/Pleura: No suspicious appearing pulmonary nodules or masses. No acute consolidative airspace disease. No pleural effusions.  Musculoskeletal: There are no aggressive appearing lytic or blastic lesions noted in the visualized portions of the skeleton.  CT ABDOMEN AND PELVIS FINDINGS  Abdomen/Pelvis: Subtle asymmetric thickening is noted along the a lesser curvature of the stomach, particularly near the gastroesophageal junction involving portions of the fundus and cardia. 6 mm gastrohepatic ligament lymph node is nonspecific. No other definite lymphadenopathy is noted in the upper abdomen.  The appearance of the liver, gallbladder, pancreas, spleen, bilateral adrenal glands and bilateral kidneys is unremarkable. No significant volume of ascites. No pneumoperitoneum. No pathologic distention of small bowel. Several colonic diverticulae are noted, particularly in the region of the sigmoid colon, without surrounding inflammatory changes to suggest an acute diverticulitis at this time. Status post hysterectomy. Ovaries are not confidently identified may be surgically absent or atrophic.  Musculoskeletal: There are no aggressive appearing lytic or blastic lesions noted in the visualized portions of the skeleton.  IMPRESSION: 1. Asymmetric soft tissue thickening involving predominantly the fundus and cardia of the stomach, concerning for potential gastric neoplasm. There is also some mild asymmetric soft tissue thickening involving the posterior aspect of the distal esophagus. 2. No definite signs of metastatic disease in the chest, abdomen or pelvis. 3. Additional  incidental findings, as above.   Electronically Signed   By: Vinnie Langton M.D.   On: 11/28/2013 12:42   Nm Pet Image Initial (pi) Skull Base To Thigh  12/23/2013   CLINICAL DATA:  Initial treatment strategy for gastric cancer.  EXAM: NUCLEAR MEDICINE PET SKULL BASE TO THIGH  TECHNIQUE: 9.1 mCi F-18 FDG was injected intravenously. Full-ring PET imaging was performed from the skull base to thigh after the radiotracer. CT data was obtained and used for attenuation correction and anatomic localization.  FASTING BLOOD GLUCOSE:  Value: 92. Mg/dl  COMPARISON:  None.  FINDINGS: NECK  No hypermetabolic lymph nodes in the neck.  CHEST  Mild nonspecific increased uptake is associated with bilateral hilar regions. SUV max is equal to 4.2 within the right hilar region and 3.4 in the left hilar region. No significant adenopathy identified on corresponding CT images. No suspicious pulmonary nodules on the CT scan.  ABDOMEN/PELVIS  Mild FDG uptake is associated with the mild diffuse wall  thickening involving the stomach. In the area of the gastric cardia the SUV max is equal to 2.6. Along the greater curvature of the stomach the SUV max is equal to 2.8. No significant FDG uptake is associated with the gastrohepatic ligament lymph node. No abnormal hypermetabolic activity within the liver, pancreas, adrenal glands, or spleen. No hypermetabolic lymph nodes in the abdomen or pelvis.  SKELETON  No focal hypermetabolic activity to suggest skeletal metastasis.  IMPRESSION: 1. There is mild diffuse increased uptake associated with the distal esophagus and wall of stomach. The SUV max associated with the wall of stomach measures up to 2.6. 2. No hypermetabolic adenopathy or evidence of distant metastatic disease from patient's biopsy proven gastric cancer. 3. Mild increased uptake associated with bilateral hilar regions without adenopathy on corresponding CT images.   Electronically Signed   By: Kerby Moors M.D.   On: 12/23/2013  14:09      Recent Lab Findings: Lab Results  Component Value Date   WBC 4.4 02/22/2014   HGB 11.7 02/22/2014   HCT 34.8 02/22/2014   PLT 200 02/22/2014   GLUCOSE 80 02/13/2014   ALT 21 02/13/2014   AST 26 02/13/2014   NA 143 02/13/2014   K 4.2 02/13/2014   CL 108 11/24/2013   CREATININE 0.8 02/13/2014   BUN 11.9 02/13/2014   CO2 26 02/13/2014   Path: [facility] 15,1751 Diagnosis Stomach, biopsy, r/o neoplasia - POORLY DIFFERENTIATED ADENOCARCINOMA WITH SIGNET RING CELLS, SEE COMMENT. - NEGATIVE FOR HELICOBACTER PYLORI. Diagnosis Note Many of the fragments consist of gastric mucosa with mucin expanding the lamina propria with sparse atypical cells including signet ring cells. One fragment has more dense expansion of the lamina propria with poorly differentiated cells and signet ring cells, consistent with adenocarcinoma. There is background chronic gastritis.  EUS: Endoscopic findings: 1. The stomach was very poorly distensible There is obvious tumor involving about 2/3 of the stomach, circumferentially. The distal aspect of this obvious tumor is 8-9cm from the pyloris. The tumor was biopsies extensively, tunnel biopsied in several locations. EUS findings: 1. There tumor described above correlated with variably thickened, hypoechoic gastric wall by EUS. The most thickened gastric wall was in the proximal stomach were it was 1.2cm thick. It appears that the proximal edge of this tumor is above 2cm above the GE junction and then it continues into the distal stomach (7-8cm from the pyloris   Assessment / Plan:   Extensive poorly differentiated adenocarcinoma of the stomach  involving the GE junction and cardia of the stomach , without evidence of definite metastatic disease.  The recurrent PET scan suggest good response to her chemotherapy treatment. I am in agreement with Dr. Barry Dienes to proceed gastric section/GE junction resection possibly from the abdomen only. I will coordinate with Dr. Barry Dienes  to assist should we need to move above the diaphragm. I discussed this with the patient and her husband in detail. Tentatively we could proceed in mid September given the patient several weeks to recover from the final chemotherapy treatments.  Grace Isaac MD      [address] [place] [address] Office [phone]   Beeper [phone]  03/02/2014 3:24 PM

## 2014-03-02 NOTE — Telephone Encounter (Signed)
returned pt's call re 8/17 appt and what it is for. not able to reach pt or lm.

## 2014-03-03 ENCOUNTER — Telehealth: Payer: Self-pay | Admitting: *Deleted

## 2014-03-03 ENCOUNTER — Encounter: Payer: Self-pay | Admitting: *Deleted

## 2014-03-03 ENCOUNTER — Other Ambulatory Visit (INDEPENDENT_AMBULATORY_CARE_PROVIDER_SITE_OTHER): Payer: Self-pay | Admitting: General Surgery

## 2014-03-03 NOTE — Progress Notes (Unsigned)
Patient called to cancel her chemo appointment for 03/06/14 saying she had surgery and her scan was clear, so she needs no more chemo. Will forward this information to Dr. Benay Spice. Next appointment is 04/06/14 for flush and office visit.

## 2014-03-03 NOTE — Telephone Encounter (Signed)
Per patient and desk RN I have canceled 8/17 appt

## 2014-03-06 ENCOUNTER — Ambulatory Visit: Payer: Medicare Other

## 2014-03-06 ENCOUNTER — Encounter: Payer: Medicare Other | Admitting: Nutrition

## 2014-03-07 ENCOUNTER — Telehealth: Payer: Self-pay | Admitting: *Deleted

## 2014-03-07 NOTE — Telephone Encounter (Signed)
Received call from patient requesting that Dr. Benay Spice review her PET results.  She wants him to look at the images and make sure there is no reason to be concerned about an area in her chest. She is supposed to be scheduled for surgery very soon.   Message sent to Dr. Benay Spice

## 2014-03-09 ENCOUNTER — Telehealth: Payer: Self-pay | Admitting: Nurse Practitioner

## 2014-03-09 ENCOUNTER — Other Ambulatory Visit: Payer: Self-pay

## 2014-03-09 DIAGNOSIS — C169 Malignant neoplasm of stomach, unspecified: Secondary | ICD-10-CM

## 2014-03-09 NOTE — Telephone Encounter (Signed)
Pt called to r/s apt due to surgery...Marland KitchenMarland KitchenKJ

## 2014-03-20 ENCOUNTER — Ambulatory Visit: Payer: Medicare Other | Admitting: Nutrition

## 2014-03-20 NOTE — Progress Notes (Signed)
Nutrition followup completed with patient. Patient's treatment for gastroesophageal cancer was completed.  She is scheduled for surgery September 15.  Patient reports weight as 119 pounds.  Appetite has improved. Patient is interested in participating in the trial utilizing Impact AR.  Nutrition diagnosis: Unintended weight loss improved.  Intervention: Educated on the importance of healthy diet before surgery. Patient educated to begin Impact AR 5 days prior to surgery.  She will consume 3 cartons daily for 5 days before surgery. Patient educated on strategies for improving flavor.  Patient given written information regarding impact AR.   Patient signed release for participating in trial.  Patient will pick up case of impact AR on September 8, when she comes to the [facility].   Questions were answered.  Teach back method used.  Monitoring, evaluation, goals: Patient will tolerate healthy diet.  She will begin impact AR 5 days prior to surgery.  Next visit: September 8.     **Disclaimer: This note was dictated with voice recognition software. Similar sounding words can inadvertently be transcribed and this note may contain transcription errors which may not have been corrected upon publication of note.**

## 2014-03-23 ENCOUNTER — Encounter (HOSPITAL_COMMUNITY): Payer: Self-pay | Admitting: Pharmacy Technician

## 2014-03-24 NOTE — Pre-Procedure Instructions (Signed)
TERRALYN MATSUMURA  03/24/2014   Your procedure is scheduled on:  Tues, Sept 15 @ 7:30 AM  Report to [facility]  at 5:30 AM.  Call this number if you have problems the morning of surgery: [phone]   Remember:   Do not eat food or drink liquids after midnight.   Take these medicines the morning of surgery with A SIP OF WATER: Zoloft(Sertraline)               Stop taking your Ibuprofen and Vitamins. No Goody's,BC's,Aleve,Aspirin,Fish Oil,or any Herbal Medications   Do not wear jewelry, make-up or nail polish.  Do not wear lotions, powders, or perfumes.   Do not shave 48 hours prior to surgery.   Do not bring valuables to the hospital.  [facility] is not responsible                  for any belongings or valuables.               Contacts, dentures or bridgework may not be worn into surgery.  Leave suitcase in the car. After surgery it may be brought to your room.  For patients admitted to the hospital, discharge time is determined by your                treatment team.              Special Instructions:  [facility] - Preparing for Surgery  Before surgery, you can play an important role.  Because skin is not sterile, your skin needs to be as free of germs as possible.  You can reduce the number of germs on you skin by washing with CHG (chlorahexidine gluconate) soap before surgery.  CHG is an antiseptic cleaner which kills germs and bonds with the skin to continue killing germs even after washing.  Please DO NOT use if you have an allergy to CHG or antibacterial soaps.  If your skin becomes reddened/irritated stop using the CHG and inform your nurse when you arrive at Short Stay.  Do not shave (including legs and underarms) for at least 48 hours prior to the first CHG shower.  You may shave your face.  Please follow these instructions carefully:   1.  Shower with CHG Soap the night before surgery and the                                morning of Surgery.  2.  If you choose to  wash your hair, wash your hair first as usual with your       normal shampoo.  3.  After you shampoo, rinse your hair and body thoroughly to remove the                      Shampoo.  4.  Use CHG as you would any other liquid soap.  You can apply chg directly       to the skin and wash gently with scrungie or a clean washcloth.  5.  Apply the CHG Soap to your body ONLY FROM THE NECK DOWN.        Do not use on open wounds or open sores.  Avoid contact with your eyes,       ears, mouth and genitals (private parts).  Wash genitals (private parts)       with your normal soap.  6.  Wash thoroughly, paying special attention to the area where your surgery        will be performed.  7.  Thoroughly rinse your body with warm water from the neck down.  8.  DO NOT shower/wash with your normal soap after using and rinsing off       the CHG Soap.  9.  Pat yourself dry with a clean towel.            10.  Wear clean pajamas.            11.  Place clean sheets on your bed the night of your first shower and do not        sleep with pets.  Day of Surgery  Do not apply any lotions/deoderants the morning of surgery.  Please wear clean clothes to the hospital/surgery center.     Please read over the following fact sheets that you were given: Pain Booklet, Coughing and Deep Breathing, Blood Transfusion Information and Surgical Site Infection Prevention

## 2014-03-27 ENCOUNTER — Other Ambulatory Visit: Payer: Self-pay | Admitting: Oncology

## 2014-03-28 ENCOUNTER — Encounter (HOSPITAL_COMMUNITY): Payer: Self-pay

## 2014-03-28 ENCOUNTER — Encounter (HOSPITAL_COMMUNITY)
Admission: RE | Admit: 2014-03-28 | Discharge: 2014-03-28 | Disposition: A | Discharge status: Home / Self Care | Payer: Medicare Other | Source: Ambulatory Visit | Attending: General Surgery | Admitting: General Surgery

## 2014-03-28 VITALS — BP 125/72 | HR 61 | Temp 97.3°F | Resp 20 | Ht 60.5 in | Wt 118.0 lb

## 2014-03-28 DIAGNOSIS — Z01818 Encounter for other preprocedural examination: Secondary | ICD-10-CM | POA: Diagnosis present

## 2014-03-28 DIAGNOSIS — C169 Malignant neoplasm of stomach, unspecified: Secondary | ICD-10-CM | POA: Insufficient documentation

## 2014-03-28 HISTORY — DX: Personal history of other diseases of the respiratory system: Z87.09

## 2014-03-28 HISTORY — DX: Diverticulosis of intestine, part unspecified, without perforation or abscess without bleeding: K57.90

## 2014-03-28 HISTORY — DX: Personal history of other diseases of the nervous system and sense organs: Z86.69

## 2014-03-28 HISTORY — DX: Pneumonia, unspecified organism: J18.9

## 2014-03-28 HISTORY — DX: Unspecified optic neuritis: H46.9

## 2014-03-28 LAB — URINE MICROSCOPIC-ADD ON

## 2014-03-28 LAB — COMPREHENSIVE METABOLIC PANEL
ALT: 18 U/L (ref 0–35)
AST: 27 U/L (ref 0–37)
Albumin: 3.4 g/dL — ABNORMAL LOW (ref 3.5–5.2)
Alkaline Phosphatase: 59 U/L (ref 39–117)
Anion gap: 14 (ref 5–15)
BUN: 18 mg/dL (ref 6–23)
CO2: 20 mEq/L (ref 19–32)
Calcium: 9 mg/dL (ref 8.4–10.5)
Chloride: 105 mEq/L (ref 96–112)
Creatinine, Ser: 0.8 mg/dL (ref 0.50–1.10)
GFR calc Af Amer: 84 mL/min — ABNORMAL LOW (ref >90)
GFR calc non Af Amer: 72 mL/min — ABNORMAL LOW (ref >90)
Glucose, Bld: 90 mg/dL (ref 70–99)
Potassium: 4.4 mEq/L (ref 3.7–5.3)
Sodium: 139 mEq/L (ref 137–147)
Total Bilirubin: 0.7 mg/dL (ref 0.3–1.2)
Total Protein: 6.6 g/dL (ref 6.0–8.3)

## 2014-03-28 LAB — BLOOD GAS, ARTERIAL
Acid-Base Excess: 0.9 mmol/L (ref 0.0–2.0)
Bicarbonate: 24.7 mEq/L — ABNORMAL HIGH (ref 20.0–24.0)
Drawn by: 421801
FIO2: 0.21 %
O2 Saturation: 98.8 %
Patient temperature: 98.6
TCO2: 25.8 mmol/L (ref 0–100)
pCO2 arterial: 37 mmHg (ref 35.0–45.0)
pH, Arterial: 7.439 (ref 7.350–7.450)
pO2, Arterial: 107 mmHg — ABNORMAL HIGH (ref 80.0–100.0)

## 2014-03-28 LAB — URINALYSIS, ROUTINE W REFLEX MICROSCOPIC
Bilirubin Urine: NEGATIVE
Glucose, UA: NEGATIVE mg/dL
Hgb urine dipstick: NEGATIVE
Ketones, ur: NEGATIVE mg/dL
Nitrite: NEGATIVE
Protein, ur: NEGATIVE mg/dL
Specific Gravity, Urine: 1.015 (ref 1.005–1.030)
Urobilinogen, UA: 0.2 mg/dL (ref 0.0–1.0)
pH: 6 (ref 5.0–8.0)

## 2014-03-28 LAB — PROTIME-INR
INR: 0.93 (ref 0.00–1.49)
Prothrombin Time: 12.5 seconds (ref 11.6–15.2)

## 2014-03-28 LAB — APTT: aPTT: 25 seconds (ref 24–37)

## 2014-03-28 LAB — CBC
HCT: 37.5 % (ref 36.0–46.0)
Hemoglobin: 13 g/dL (ref 12.0–15.0)
MCH: 32.6 pg (ref 26.0–34.0)
MCHC: 34.7 g/dL (ref 30.0–36.0)
MCV: 94 fL (ref 78.0–100.0)
Platelets: 185 10*3/uL (ref 150–400)
RBC: 3.99 MIL/uL (ref 3.87–5.11)
RDW: 17.2 % — ABNORMAL HIGH (ref 11.5–15.5)
WBC: 4.5 10*3/uL (ref 4.0–10.5)

## 2014-03-28 LAB — SURGICAL PCR SCREEN
MRSA, PCR: NEGATIVE
Staphylococcus aureus: NEGATIVE

## 2014-03-28 LAB — ABO/RH: ABO/RH(D): A POS

## 2014-03-28 NOTE — Progress Notes (Signed)
Sleep study done > 62yrs ago but not confirmed to be sleep apnea

## 2014-03-28 NOTE — Progress Notes (Signed)
Pt doesn't have a cardiologist  Stress test done 10+yrs ago d/t anxiety  Denies ever having an echo or heart cath  Medical Md is Dr.Camille Jonni Sanger on [facility]  Denies EKG in past month

## 2014-03-31 ENCOUNTER — Telehealth: Payer: Self-pay | Admitting: Oncology

## 2014-03-31 ENCOUNTER — Ambulatory Visit (HOSPITAL_BASED_OUTPATIENT_CLINIC_OR_DEPARTMENT_OTHER): Payer: Medicare Other

## 2014-03-31 ENCOUNTER — Ambulatory Visit (HOSPITAL_BASED_OUTPATIENT_CLINIC_OR_DEPARTMENT_OTHER): Payer: Medicare Other | Admitting: Nurse Practitioner

## 2014-03-31 VITALS — BP 106/66 | HR 77 | Temp 97.8°F | Resp 17 | Ht 60.5 in | Wt 119.0 lb

## 2014-03-31 DIAGNOSIS — C169 Malignant neoplasm of stomach, unspecified: Secondary | ICD-10-CM

## 2014-03-31 DIAGNOSIS — R634 Abnormal weight loss: Secondary | ICD-10-CM

## 2014-03-31 DIAGNOSIS — Z23 Encounter for immunization: Secondary | ICD-10-CM

## 2014-03-31 DIAGNOSIS — Z95828 Presence of other vascular implants and grafts: Secondary | ICD-10-CM

## 2014-03-31 MED ORDER — INFLUENZA VAC SPLIT QUAD 0.5 ML IM SUSY
0.5000 mL | PREFILLED_SYRINGE | Freq: Once | INTRAMUSCULAR | Status: AC
Start: 2014-03-31 — End: 2014-03-31
  Administered 2014-03-31: 0.5 mL via INTRAMUSCULAR
  Filled 2014-03-31: qty 0.5

## 2014-03-31 MED ORDER — SODIUM CHLORIDE 0.9 % IJ SOLN
10.0000 mL | INTRAMUSCULAR | Status: DC | PRN
  Administered 2014-03-31: 10 mL via INTRAVENOUS
  Filled 2014-03-31: qty 10

## 2014-03-31 MED ORDER — HEPARIN SOD (PORK) LOCK FLUSH 100 UNIT/ML IV SOLN
500.0000 [IU] | Freq: Once | INTRAVENOUS | Status: AC
  Administered 2014-03-31: 500 [IU] via INTRAVENOUS
  Filled 2014-03-31: qty 5

## 2014-03-31 NOTE — Progress Notes (Signed)
  [facility] OFFICE PROGRESS NOTE   Diagnosis: Gastric cancer   INTERVAL HISTORY:   Lynn Morris returns as scheduled. She reports she is scheduled for surgery on 04/04/2014. She feels well. She has a good appetite. No dysphagia or odynophagia. She denies pain. No numbness or tingling in her hands or feet. No mouth sores she denies nausea/vomiting. No diarrhea.  Objective:  Vital signs in last 24 hours:  Blood pressure 106/66, pulse 77, temperature 97.8 F (36.6 C), temperature source Oral, resp. rate 17, height 5' 0.5" (1.537 m), weight 119 lb (53.978 kg), SpO2 98.00%.    HEENT: No thrush or ulcers. Lymphatics: No palpable cervical, supraclavicular or axillary lymph nodes. Resp: Lungs clear bilaterally. Cardio: Regular rate and rhythm. GI: Abdomen soft and nontender. No hepatomegaly. Vascular: No leg edema. Skin: No rash. Port-A-Cath site without erythema.    Lab Results:  Lab Results  Component Value Date   WBC 4.5 03/28/2014   HGB 13.0 03/28/2014   HCT 37.5 03/28/2014   MCV 94.0 03/28/2014   PLT 185 03/28/2014   NEUTROABS 2.0 02/22/2014    Imaging:  No results found.  Medications: I have reviewed the patient's current medications.  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  Cycle 1 CAPOX beginning 01/02/2014.  Cycle 2 CAPOX beginning 01/23/2014.  Cycle 3 CAPOX beginning 02/13/2014. Restaging PET scan 02/27/2014 with mild hypermetabolism in the hilar regions with an SUV max on the right of 4.9. Previously seen mild hypermetabolism associated with the distal esophagus not readily identified. Mild uptake in the stomach without a focal lesion or uptake above blood pool. 2. History of gastroesophageal reflux disease and peptic stricture, status post an esophageal dilatation procedure in August 2012 3. Anorexia/weight loss. Improved. 4. History of solid dysphagia secondary to #1.  5. Anxiety. 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 7. Foot pain and redness secondary to Xeloda. Resolved.   Disposition: Lynn Morris appears well. She is scheduled for surgery 04/04/2014. We scheduled a followup visit here in approximately 8 weeks.  Plan reviewed with Dr. Benay Spice.    Ned Card ANP/GNP-BC   03/31/2014  9:50 AM

## 2014-03-31 NOTE — Patient Instructions (Signed)

## 2014-03-31 NOTE — Addendum Note (Signed)
Addended by: Tyler Aas A on: 03/31/2014 10:03 AM   Modules accepted: Orders

## 2014-03-31 NOTE — Telephone Encounter (Signed)
line busy....amiled pt appt sched/avs and letter

## 2014-04-03 ENCOUNTER — Telehealth: Payer: Self-pay | Admitting: Oncology

## 2014-04-03 MED ORDER — DEXTROSE 5 % IV SOLN
1.5000 g | INTRAVENOUS | Status: DC
  Filled 2014-04-03: qty 1.5

## 2014-04-03 MED ORDER — DEXTROSE 5 % IV SOLN
2.0000 g | INTRAVENOUS | Status: AC
  Administered 2014-04-04: 2 g via INTRAVENOUS
  Filled 2014-04-03: qty 2

## 2014-04-03 NOTE — Telephone Encounter (Signed)
Pt cld to r/s due to she will be out of town.Marland Kitchen..KJ

## 2014-04-04 ENCOUNTER — Inpatient Hospital Stay (HOSPITAL_COMMUNITY)
Admission: RE | Admit: 2014-04-04 | Discharge: 2014-04-14 | DRG: 327 | Disposition: A | Discharge status: Home / Self Care | Payer: Medicare Other | Source: Ambulatory Visit | Attending: General Surgery | Admitting: General Surgery

## 2014-04-04 ENCOUNTER — Inpatient Hospital Stay (HOSPITAL_COMMUNITY): Payer: Medicare Other

## 2014-04-04 ENCOUNTER — Encounter (HOSPITAL_COMMUNITY): Payer: Self-pay | Admitting: General Surgery

## 2014-04-04 ENCOUNTER — Inpatient Hospital Stay (HOSPITAL_COMMUNITY): Payer: Medicare Other | Admitting: Anesthesiology

## 2014-04-04 ENCOUNTER — Encounter (HOSPITAL_COMMUNITY)
Admission: RE | Disposition: A | Discharge status: Home / Self Care | Payer: Self-pay | Source: Ambulatory Visit | Attending: General Surgery

## 2014-04-04 ENCOUNTER — Encounter (HOSPITAL_COMMUNITY): Payer: Medicare Other | Admitting: Anesthesiology

## 2014-04-04 DIAGNOSIS — K219 Gastro-esophageal reflux disease without esophagitis: Secondary | ICD-10-CM | POA: Diagnosis present

## 2014-04-04 DIAGNOSIS — C160 Malignant neoplasm of cardia: Secondary | ICD-10-CM

## 2014-04-04 DIAGNOSIS — C169 Malignant neoplasm of stomach, unspecified: Secondary | ICD-10-CM | POA: Diagnosis present

## 2014-04-04 DIAGNOSIS — I959 Hypotension, unspecified: Secondary | ICD-10-CM | POA: Diagnosis not present

## 2014-04-04 DIAGNOSIS — D62 Acute posthemorrhagic anemia: Secondary | ICD-10-CM | POA: Diagnosis present

## 2014-04-04 HISTORY — PX: LAPAROSCOPY: SHX197

## 2014-04-04 HISTORY — PX: JEJUNOSTOMY: SHX313

## 2014-04-04 HISTORY — PX: PARTIAL GASTRECTOMY: SHX6003

## 2014-04-04 LAB — POCT I-STAT 7, (LYTES, BLD GAS, ICA,H+H)
ACID-BASE EXCESS: 1 mmol/L (ref 0.0–2.0)
BICARBONATE: 25 meq/L — AB (ref 20.0–24.0)
BICARBONATE: 26 meq/L — AB (ref 20.0–24.0)
BICARBONATE: 26.5 meq/L — AB (ref 20.0–24.0)
CALCIUM ION: 1.15 mmol/L (ref 1.13–1.30)
Calcium, Ion: 1.14 mmol/L (ref 1.13–1.30)
Calcium, Ion: 1.19 mmol/L (ref 1.13–1.30)
HCT: 25 % — ABNORMAL LOW (ref 36.0–46.0)
HCT: 27 % — ABNORMAL LOW (ref 36.0–46.0)
HCT: 27 % — ABNORMAL LOW (ref 36.0–46.0)
HEMOGLOBIN: 8.5 g/dL — AB (ref 12.0–15.0)
Hemoglobin: 9.2 g/dL — ABNORMAL LOW (ref 12.0–15.0)
Hemoglobin: 9.2 g/dL — ABNORMAL LOW (ref 12.0–15.0)
O2 Saturation: 100 %
O2 Saturation: 100 %
O2 Saturation: 100 %
PCO2 ART: 53.3 mmHg — AB (ref 35.0–45.0)
PH ART: 7.305 — AB (ref 7.350–7.450)
PO2 ART: 259 mmHg — AB (ref 80.0–100.0)
POTASSIUM: 3.5 meq/L — AB (ref 3.7–5.3)
Potassium: 3.7 mEq/L (ref 3.7–5.3)
Potassium: 4 mEq/L (ref 3.7–5.3)
Sodium: 136 mEq/L — ABNORMAL LOW (ref 137–147)
Sodium: 136 mEq/L — ABNORMAL LOW (ref 137–147)
Sodium: 137 mEq/L (ref 137–147)
TCO2: 26 mmol/L (ref 0–100)
TCO2: 27 mmol/L (ref 0–100)
TCO2: 28 mmol/L (ref 0–100)
pCO2 arterial: 41.8 mmHg (ref 35.0–45.0)
pCO2 arterial: 40.7 mmHg (ref 35.0–45.0)
pH, Arterial: 7.385 (ref 7.350–7.450)
pH, Arterial: 7.413 (ref 7.350–7.450)
pO2, Arterial: 213 mmHg — ABNORMAL HIGH (ref 80.0–100.0)
pO2, Arterial: 484 mmHg — ABNORMAL HIGH (ref 80.0–100.0)

## 2014-04-04 LAB — BLOOD PRODUCT ORDER (VERBAL) VERIFICATION

## 2014-04-04 LAB — BASIC METABOLIC PANEL
ANION GAP: 10 (ref 5–15)
BUN: 19 mg/dL (ref 6–23)
CALCIUM: 7.9 mg/dL — AB (ref 8.4–10.5)
CO2: 24 meq/L (ref 19–32)
CREATININE: 0.65 mg/dL (ref 0.50–1.10)
Chloride: 102 mEq/L (ref 96–112)
GFR calc Af Amer: >90 mL/min (ref >90)
GFR calc non Af Amer: 87 mL/min — ABNORMAL LOW (ref >90)
Glucose, Bld: 202 mg/dL — ABNORMAL HIGH (ref 70–99)
Potassium: 4.5 mEq/L (ref 3.7–5.3)
Sodium: 136 mEq/L — ABNORMAL LOW (ref 137–147)

## 2014-04-04 LAB — GLUCOSE, CAPILLARY
GLUCOSE-CAPILLARY: 167 mg/dL — AB (ref 70–99)
Glucose-Capillary: 160 mg/dL — ABNORMAL HIGH (ref 70–99)
Glucose-Capillary: 187 mg/dL — ABNORMAL HIGH (ref 70–99)

## 2014-04-04 LAB — CBC
HEMATOCRIT: 28.8 % — AB (ref 36.0–46.0)
Hemoglobin: 9.9 g/dL — ABNORMAL LOW (ref 12.0–15.0)
MCH: 32.2 pg (ref 26.0–34.0)
MCHC: 34.4 g/dL (ref 30.0–36.0)
MCV: 93.8 fL (ref 78.0–100.0)
Platelets: 221 10*3/uL (ref 150–400)
RBC: 3.07 MIL/uL — ABNORMAL LOW (ref 3.87–5.11)
RDW: 15.7 % — ABNORMAL HIGH (ref 11.5–15.5)
WBC: 10.5 10*3/uL (ref 4.0–10.5)

## 2014-04-04 SURGERY — LAPAROSCOPY, DIAGNOSTIC
Anesthesia: General | Site: Chest | Laterality: Right

## 2014-04-04 MED ORDER — LACTATED RINGERS IV SOLN
INTRAVENOUS | Status: DC | PRN
  Administered 2014-04-04 (×3): via INTRAVENOUS

## 2014-04-04 MED ORDER — ALBUMIN HUMAN 5 % IV SOLN
INTRAVENOUS | Status: DC | PRN
  Administered 2014-04-04 (×2): via INTRAVENOUS

## 2014-04-04 MED ORDER — ONDANSETRON HCL 4 MG/2ML IJ SOLN
INTRAMUSCULAR | Status: DC | PRN
  Administered 2014-04-04: 4 mg via INTRAVENOUS

## 2014-04-04 MED ORDER — DEXAMETHASONE SODIUM PHOSPHATE 10 MG/ML IJ SOLN
INTRAMUSCULAR | Status: DC | PRN
  Administered 2014-04-04: 8 mg via INTRAVENOUS

## 2014-04-04 MED ORDER — KCL IN DEXTROSE-NACL 20-5-0.45 MEQ/L-%-% IV SOLN
INTRAVENOUS | Status: AC
  Filled 2014-04-04: qty 1000

## 2014-04-04 MED ORDER — NALOXONE HCL 0.4 MG/ML IJ SOLN: 0.4000 mg | INTRAMUSCULAR | Status: DC | PRN

## 2014-04-04 MED ORDER — DIPHENHYDRAMINE HCL 12.5 MG/5ML PO ELIX
12.5000 mg | ORAL_SOLUTION | Freq: Four times a day (QID) | ORAL | Status: DC | PRN
  Filled 2014-04-04: qty 5

## 2014-04-04 MED ORDER — PHENYLEPHRINE 40 MCG/ML (10ML) SYRINGE FOR IV PUSH (FOR BLOOD PRESSURE SUPPORT)
PREFILLED_SYRINGE | INTRAVENOUS | Status: AC
  Filled 2014-04-04: qty 10

## 2014-04-04 MED ORDER — BUPIVACAINE-EPINEPHRINE (PF) 0.25% -1:200000 IJ SOLN
INTRAMUSCULAR | Status: AC
  Filled 2014-04-04: qty 30

## 2014-04-04 MED ORDER — GLYCOPYRROLATE 0.2 MG/ML IJ SOLN
INTRAMUSCULAR | Status: AC
  Filled 2014-04-04: qty 3

## 2014-04-04 MED ORDER — SODIUM CHLORIDE 0.9 % IR SOLN
Status: DC | PRN
  Administered 2014-04-04: 1

## 2014-04-04 MED ORDER — LACTATED RINGERS IV SOLN
INTRAVENOUS | Status: DC | PRN
  Administered 2014-04-04 (×2): via INTRAVENOUS

## 2014-04-04 MED ORDER — LIDOCAINE HCL 1 % IJ SOLN
INTRAMUSCULAR | Status: DC | PRN
  Administered 2014-04-04: 12:00:00

## 2014-04-04 MED ORDER — ONDANSETRON HCL 4 MG/2ML IJ SOLN
INTRAMUSCULAR | Status: AC
  Filled 2014-04-04: qty 2

## 2014-04-04 MED ORDER — GLYCOPYRROLATE 0.2 MG/ML IJ SOLN
INTRAMUSCULAR | Status: DC | PRN
  Administered 2014-04-04: .6 mg via INTRAVENOUS

## 2014-04-04 MED ORDER — ONDANSETRON HCL 4 MG/2ML IJ SOLN: 4.0000 mg | Freq: Four times a day (QID) | INTRAMUSCULAR | Status: DC | PRN

## 2014-04-04 MED ORDER — DEXMEDETOMIDINE HCL IN NACL 200 MCG/50ML IV SOLN
INTRAVENOUS | Status: DC | PRN
  Administered 2014-04-04: 0.5 ug/kg/h via INTRAVENOUS

## 2014-04-04 MED ORDER — CETYLPYRIDINIUM CHLORIDE 0.05 % MT LIQD
7.0000 mL | Freq: Two times a day (BID) | OROMUCOSAL | Status: DC
  Administered 2014-04-04: 7 mL via OROMUCOSAL

## 2014-04-04 MED ORDER — LIDOCAINE HCL (PF) 1 % IJ SOLN
INTRAMUSCULAR | Status: AC
  Filled 2014-04-04: qty 30

## 2014-04-04 MED ORDER — PHENYLEPHRINE HCL 10 MG/ML IJ SOLN
30.0000 ug/min | INTRAMUSCULAR | Status: DC
  Administered 2014-04-04 (×2): 30 ug/min via INTRAVENOUS
  Administered 2014-04-05: 45 ug/min via INTRAVENOUS
  Filled 2014-04-04 (×3): qty 1

## 2014-04-04 MED ORDER — EPHEDRINE SULFATE 50 MG/ML IJ SOLN
INTRAMUSCULAR | Status: DC | PRN
  Administered 2014-04-04 (×2): 10 mg via INTRAVENOUS

## 2014-04-04 MED ORDER — CEFAZOLIN SODIUM 1-5 GM-% IV SOLN
1.0000 g | Freq: Four times a day (QID) | INTRAVENOUS | Status: AC
  Administered 2014-04-04 – 2014-04-05 (×3): 1 g via INTRAVENOUS
  Filled 2014-04-04 (×3): qty 50

## 2014-04-04 MED ORDER — DEXMEDETOMIDINE HCL IN NACL 200 MCG/50ML IV SOLN
INTRAVENOUS | Status: AC
  Filled 2014-04-04: qty 50

## 2014-04-04 MED ORDER — SUCCINYLCHOLINE CHLORIDE 20 MG/ML IJ SOLN
INTRAMUSCULAR | Status: AC
  Filled 2014-04-04: qty 1

## 2014-04-04 MED ORDER — SUCCINYLCHOLINE CHLORIDE 20 MG/ML IJ SOLN
INTRAMUSCULAR | Status: DC | PRN
  Administered 2014-04-04: 100 mg via INTRAVENOUS

## 2014-04-04 MED ORDER — SODIUM CHLORIDE 0.9 % IJ SOLN: 9.0000 mL | INTRAMUSCULAR | Status: DC | PRN

## 2014-04-04 MED ORDER — DEXTROSE 5 % IV SOLN
10.0000 mg | INTRAVENOUS | Status: DC | PRN
  Administered 2014-04-04: 15 ug/min via INTRAVENOUS

## 2014-04-04 MED ORDER — LIDOCAINE HCL (CARDIAC) 20 MG/ML IV SOLN
INTRAVENOUS | Status: AC
  Filled 2014-04-04: qty 5

## 2014-04-04 MED ORDER — KCL IN DEXTROSE-NACL 20-5-0.45 MEQ/L-%-% IV SOLN
INTRAVENOUS | Status: DC
  Administered 2014-04-04 – 2014-04-08 (×9): via INTRAVENOUS
  Administered 2014-04-09: 50 mL/h via INTRAVENOUS
  Administered 2014-04-10 – 2014-04-11 (×3): via INTRAVENOUS
  Filled 2014-04-04 (×16): qty 1000

## 2014-04-04 MED ORDER — ONDANSETRON HCL 4 MG/2ML IJ SOLN: 4.0000 mg | Freq: Once | INTRAMUSCULAR | Status: DC | PRN

## 2014-04-04 MED ORDER — MORPHINE SULFATE (PF) 1 MG/ML IV SOLN
INTRAVENOUS | Status: DC
  Administered 2014-04-04: 13:00:00 via INTRAVENOUS
  Administered 2014-04-04: 7.5 mg via INTRAVENOUS
  Administered 2014-04-04: 11.1 mg via INTRAVENOUS
  Administered 2014-04-05: 4.5 mg via INTRAVENOUS
  Administered 2014-04-05: 8.59 mg via INTRAVENOUS
  Administered 2014-04-05: 9 mg via INTRAVENOUS
  Administered 2014-04-05: 22:00:00 via INTRAVENOUS
  Administered 2014-04-05: 6 mg via INTRAVENOUS
  Administered 2014-04-05: 18 mg via INTRAVENOUS
  Administered 2014-04-05: 01:00:00 via INTRAVENOUS
  Administered 2014-04-06: 2 mg via INTRAVENOUS
  Administered 2014-04-06: 8.12 mg via INTRAVENOUS
  Administered 2014-04-06: 12 mg via INTRAVENOUS
  Filled 2014-04-04 (×3): qty 25

## 2014-04-04 MED ORDER — MORPHINE SULFATE 2 MG/ML IJ SOLN
1.0000 mg | INTRAMUSCULAR | Status: DC | PRN
Start: 2014-04-04 — End: 2014-04-06

## 2014-04-04 MED ORDER — ONDANSETRON HCL 4 MG PO TABS: 4.0000 mg | ORAL_TABLET | Freq: Four times a day (QID) | ORAL | Status: DC | PRN

## 2014-04-04 MED ORDER — PROPOFOL 10 MG/ML IV BOLUS
INTRAVENOUS | Status: AC
  Filled 2014-04-04: qty 20

## 2014-04-04 MED ORDER — 0.9 % SODIUM CHLORIDE (POUR BTL) OPTIME
TOPICAL | Status: DC | PRN
  Administered 2014-04-04 (×2): 2000 mL

## 2014-04-04 MED ORDER — MORPHINE SULFATE (PF) 1 MG/ML IV SOLN
INTRAVENOUS | Status: AC
  Filled 2014-04-04: qty 25

## 2014-04-04 MED ORDER — HEMOSTATIC AGENTS (NO CHARGE) OPTIME
TOPICAL | Status: DC | PRN
  Administered 2014-04-04: 1 via TOPICAL

## 2014-04-04 MED ORDER — FENTANYL CITRATE 0.05 MG/ML IJ SOLN
INTRAMUSCULAR | Status: DC | PRN
  Administered 2014-04-04: 50 ug via INTRAVENOUS
  Administered 2014-04-04: 100 ug via INTRAVENOUS
  Administered 2014-04-04 (×5): 50 ug via INTRAVENOUS

## 2014-04-04 MED ORDER — FENTANYL CITRATE 0.05 MG/ML IJ SOLN
INTRAMUSCULAR | Status: AC
  Filled 2014-04-04: qty 5

## 2014-04-04 MED ORDER — NEOSTIGMINE METHYLSULFATE 10 MG/10ML IV SOLN
INTRAVENOUS | Status: AC
  Filled 2014-04-04: qty 1

## 2014-04-04 MED ORDER — ROCURONIUM BROMIDE 100 MG/10ML IV SOLN
INTRAVENOUS | Status: DC | PRN
  Administered 2014-04-04: 50 mg via INTRAVENOUS
  Administered 2014-04-04: 10 mg via INTRAVENOUS
  Administered 2014-04-04 (×2): 20 mg via INTRAVENOUS

## 2014-04-04 MED ORDER — SODIUM CHLORIDE 0.9 % IV SOLN
INTRAVENOUS | Status: DC | PRN
  Administered 2014-04-04 (×2): via INTRAVENOUS

## 2014-04-04 MED ORDER — BUPIVACAINE ON-Q PAIN PUMP (FOR ORDER SET NO CHG)
INJECTION | Status: AC
  Filled 2014-04-04: qty 1

## 2014-04-04 MED ORDER — LACTATED RINGERS IV BOLUS (SEPSIS)
500.0000 mL | Freq: Once | INTRAVENOUS | Status: AC
  Administered 2014-04-04: 500 mL via INTRAVENOUS

## 2014-04-04 MED ORDER — DIPHENHYDRAMINE HCL 50 MG/ML IJ SOLN: 12.5000 mg | Freq: Four times a day (QID) | INTRAMUSCULAR | Status: DC | PRN

## 2014-04-04 MED ORDER — LIDOCAINE HCL (CARDIAC) 20 MG/ML IV SOLN
INTRAVENOUS | Status: DC | PRN
  Administered 2014-04-04: 50 mg via INTRAVENOUS

## 2014-04-04 MED ORDER — ROCURONIUM BROMIDE 50 MG/5ML IV SOLN
INTRAVENOUS | Status: AC
  Filled 2014-04-04: qty 1

## 2014-04-04 MED ORDER — ACETAMINOPHEN 10 MG/ML IV SOLN
INTRAVENOUS | Status: AC
  Filled 2014-04-04: qty 100

## 2014-04-04 MED ORDER — ALBUMIN HUMAN 5 % IV SOLN
25.0000 g | Freq: Once | INTRAVENOUS | Status: AC
  Administered 2014-04-04: 25 g via INTRAVENOUS
  Filled 2014-04-04 (×2): qty 500

## 2014-04-04 MED ORDER — PROPOFOL 10 MG/ML IV BOLUS
INTRAVENOUS | Status: DC | PRN
  Administered 2014-04-04: 120 mg via INTRAVENOUS

## 2014-04-04 MED ORDER — NEOSTIGMINE METHYLSULFATE 10 MG/10ML IV SOLN
INTRAVENOUS | Status: DC | PRN
  Administered 2014-04-04: 4 mg via INTRAVENOUS

## 2014-04-04 MED ORDER — DEXMEDETOMIDINE HCL IN NACL 400 MCG/100ML IV SOLN
0.4000 ug/kg/h | INTRAVENOUS | Status: DC
  Filled 2014-04-04: qty 100

## 2014-04-04 MED ORDER — FENTANYL CITRATE 0.05 MG/ML IJ SOLN
INTRAMUSCULAR | Status: AC
  Administered 2014-04-04: 50 ug via INTRAVENOUS
  Filled 2014-04-04: qty 2

## 2014-04-04 MED ORDER — HYDROMORPHONE HCL PF 1 MG/ML IJ SOLN: 0.2500 mg | INTRAMUSCULAR | Status: DC | PRN

## 2014-04-04 MED ORDER — BUPIVACAINE 0.25 % ON-Q PUMP DUAL CATH 300 ML
300.0000 mL | INJECTION | Status: AC
  Filled 2014-04-04: qty 300

## 2014-04-04 MED ORDER — FENTANYL CITRATE 0.05 MG/ML IJ SOLN
25.0000 ug | INTRAMUSCULAR | Status: DC | PRN
  Administered 2014-04-04 (×2): 50 ug via INTRAVENOUS

## 2014-04-04 MED ORDER — ACETAMINOPHEN 10 MG/ML IV SOLN
1000.0000 mg | Freq: Four times a day (QID) | INTRAVENOUS | Status: AC
  Administered 2014-04-04 – 2014-04-05 (×4): 1000 mg via INTRAVENOUS
  Filled 2014-04-04 (×3): qty 100

## 2014-04-04 SURGICAL SUPPLY — 175 items
BANDAGE HEMOSTAT MRDH 4X4 STRL (MISCELLANEOUS) IMPLANT
BLADE 10 SAFETY STRL DISP (BLADE) ×5 IMPLANT
BLADE SURG 11 STRL SS (BLADE) ×5 IMPLANT
BLADE SURG ROTATE 9660 (MISCELLANEOUS) IMPLANT
BNDG HEMOSTAT MRDH 4X4 STRL (MISCELLANEOUS)
CANISTER SUCTION 2500CC (MISCELLANEOUS) ×5 IMPLANT
CATH FOLEY 2WAY SLVR 18FR 30CC (CATHETERS) ×5 IMPLANT
CATH KIT ON Q 5IN SLV (PAIN MANAGEMENT) IMPLANT
CATH KIT ON-Q SILVERSOAK 7.5IN (CATHETERS) ×10 IMPLANT
CATH ROBINSON RED A/P 18FR (CATHETERS) IMPLANT
CATH ROBINSON RED A/P 20FR (CATHETERS) ×5 IMPLANT
CATH ROBINSON RED A/P 22FR (CATHETERS) IMPLANT
CATH THORACIC 28FR (CATHETERS) IMPLANT
CATH THORACIC 36FR (CATHETERS) IMPLANT
CATH THORACIC 36FR RT ANG (CATHETERS) IMPLANT
CHLORAPREP W/TINT 26ML (MISCELLANEOUS) ×5 IMPLANT
CLIP FOGARTY SPRING 6M (CLIP) ×5 IMPLANT
CLIP LIGATING HEM O LOK PURPLE (MISCELLANEOUS) ×5 IMPLANT
CLIP LIGATING HEMO O LOK GREEN (MISCELLANEOUS) ×5 IMPLANT
CLIP TI LARGE 6 (CLIP) IMPLANT
CLIP TI MEDIUM 24 (CLIP) ×5 IMPLANT
CLIP TI WIDE RED SMALL 24 (CLIP) ×5 IMPLANT
CLOSURE WOUND 1/2 X4 (GAUZE/BANDAGES/DRESSINGS) ×1
CONT SPEC 4OZ CLIKSEAL STRL BL (MISCELLANEOUS) ×20 IMPLANT
COVER SURGICAL LIGHT HANDLE (MISCELLANEOUS) ×5 IMPLANT
DECANTER SPIKE VIAL GLASS SM (MISCELLANEOUS) IMPLANT
DERMABOND ADVANCED (GAUZE/BANDAGES/DRESSINGS)
DERMABOND ADVANCED .7 DNX12 (GAUZE/BANDAGES/DRESSINGS) IMPLANT
DRAIN CHANNEL 19F RND (DRAIN) ×5 IMPLANT
DRAIN PENROSE 1/2X36 STERILE (WOUND CARE) ×5 IMPLANT
DRAIN SUMP SARATOGA 24F (WOUND CARE) ×5 IMPLANT
DRAPE BILATERAL SPLIT (DRAPES) ×5 IMPLANT
DRAPE CAMERA VIDEO/LASER (DRAPES) ×5 IMPLANT
DRAPE CV SPLIT W-CLR ANES SCRN (DRAPES) ×5 IMPLANT
DRAPE INCISE IOBAN 66X45 STRL (DRAPES) ×5 IMPLANT
DRAPE LAPAROSCOPIC ABDOMINAL (DRAPES) IMPLANT
DRAPE SLUSH/WARMER DISC (DRAPES) IMPLANT
DRAPE STERI WOUND 35X35 8 5/8 (DRAPES) ×5 IMPLANT
DRAPE UTILITY 15X26 W/TAPE STR (DRAPE) ×10 IMPLANT
DRAPE WARM FLUID 44X44 (DRAPE) ×5 IMPLANT
DRILL BIT 7/64X5 (BIT) IMPLANT
DRSG COVADERM 4X14 (GAUZE/BANDAGES/DRESSINGS) ×5 IMPLANT
DRSG TEGADERM 4X4.75 (GAUZE/BANDAGES/DRESSINGS) ×5 IMPLANT
ELECT BLADE 4.0 EZ CLEAN MEGAD (MISCELLANEOUS)
ELECT BLADE 6.5 EXT (BLADE) ×5 IMPLANT
ELECT NEEDLE TIP 2.8 STRL (NEEDLE) IMPLANT
ELECT PAIRED SUBDERMAL (MISCELLANEOUS) ×5
ELECT REM PT RETURN 9FT ADLT (ELECTROSURGICAL) ×5
ELECTRODE BLDE 4.0 EZ CLN MEGD (MISCELLANEOUS) IMPLANT
ELECTRODE PAIRED SUBDERMAL (MISCELLANEOUS) ×3 IMPLANT
ELECTRODE REM PT RTRN 9FT ADLT (ELECTROSURGICAL) ×3 IMPLANT
EVACUATOR SILICONE 100CC (DRAIN) ×5 IMPLANT
GAUZE SPONGE 4X4 12PLY STRL (GAUZE/BANDAGES/DRESSINGS) IMPLANT
GLOVE BIO SURGEON STRL SZ 6 (GLOVE) ×15 IMPLANT
GLOVE BIO SURGEON STRL SZ 6.5 (GLOVE) ×20 IMPLANT
GLOVE BIO SURGEON STRL SZ7.5 (GLOVE) ×40 IMPLANT
GLOVE BIO SURGEONS STRL SZ 6.5 (GLOVE) ×5
GLOVE BIOGEL PI IND STRL 6.5 (GLOVE) ×3 IMPLANT
GLOVE BIOGEL PI IND STRL 7.5 (GLOVE) ×9 IMPLANT
GLOVE BIOGEL PI INDICATOR 6.5 (GLOVE) ×2
GLOVE BIOGEL PI INDICATOR 7.5 (GLOVE) ×6
GOWN STRL REUS W/ TWL LRG LVL3 (GOWN DISPOSABLE) ×21 IMPLANT
GOWN STRL REUS W/ TWL LRG LVL4 (GOWN DISPOSABLE) IMPLANT
GOWN STRL REUS W/TWL 2XL LVL3 (GOWN DISPOSABLE) ×15 IMPLANT
GOWN STRL REUS W/TWL LRG LVL3 (GOWN DISPOSABLE) ×14
GOWN STRL REUS W/TWL LRG LVL4 (GOWN DISPOSABLE)
HANDLE STAPLE ENDO GIA SHORT (STAPLE) ×2
HEMOSTAT SURGICEL 2X14 (HEMOSTASIS) ×5 IMPLANT
HEMOSTAT SURGICEL 2X4 FIBR (HEMOSTASIS) ×5 IMPLANT
INSERT FOGARTY 61MM (MISCELLANEOUS) IMPLANT
KIT BASIN OR (CUSTOM PROCEDURE TRAY) ×10 IMPLANT
KIT ROOM TURNOVER OR (KITS) ×5 IMPLANT
KIT SUCTION CATH 14FR (SUCTIONS) ×5 IMPLANT
LIGASURE IMPACT 36 18CM CVD LR (INSTRUMENTS) IMPLANT
LOOP VESSEL MINI RED (MISCELLANEOUS) IMPLANT
NS IRRIG 1000ML POUR BTL (IV SOLUTION) ×10 IMPLANT
PACK CHEST (CUSTOM PROCEDURE TRAY) ×5 IMPLANT
PAD ARMBOARD 7.5X6 YLW CONV (MISCELLANEOUS) ×10 IMPLANT
PAD SHARPS MAGNETIC DISPOSAL (MISCELLANEOUS) ×5 IMPLANT
PASSER SUT SWANSON 36MM LOOP (INSTRUMENTS) IMPLANT
PENCIL BUTTON HOLSTER BLD 10FT (ELECTRODE) ×5 IMPLANT
PLUG CATH AND CAP STER (CATHETERS) ×5 IMPLANT
PROBE NERVBE PRASS .33 (MISCELLANEOUS) IMPLANT
RELOAD EGIA 45 MED/THCK PURPLE (STAPLE) ×5 IMPLANT
RELOAD EGIA 45 TAN VASC (STAPLE) ×5 IMPLANT
RELOAD EGIA 60 MED/THCK PURPLE (STAPLE) ×20 IMPLANT
RELOAD EGIA TRIS TAN 45 CVD (STAPLE) ×5 IMPLANT
RELOAD ENDO GIA 30 3.5 (STAPLE) ×5 IMPLANT
RELOAD LINEAR CUT PROX 55 BLUE (ENDOMECHANICALS) IMPLANT
RELOAD PROXIMATE 100 BLUE (MISCELLANEOUS) ×3 IMPLANT
RELOAD PROXIMATE 100MM BLUE (MISCELLANEOUS) ×2
RELOAD PROXIMATE 75MM BLUE (ENDOMECHANICALS) ×15 IMPLANT
RETAINER VISCERA MED (MISCELLANEOUS) ×5 IMPLANT
SCISSORS LAP 5X35 DISP (ENDOMECHANICALS) IMPLANT
SET IRRIG TUBING LAPAROSCOPIC (IRRIGATION / IRRIGATOR) IMPLANT
SHEARS FOC LG CVD HARMONIC 17C (MISCELLANEOUS) ×5 IMPLANT
SLEEVE ENDOPATH XCEL 5M (ENDOMECHANICALS) IMPLANT
SOLUTION ANTI FOG 6CC (MISCELLANEOUS) IMPLANT
SPECIMEN JAR LG PLASTIC EMPTY (MISCELLANEOUS) IMPLANT
SPECIMEN JAR MEDIUM (MISCELLANEOUS) ×5 IMPLANT
SPONGE GAUZE 4X4 12PLY STER LF (GAUZE/BANDAGES/DRESSINGS) ×5 IMPLANT
SPONGE LAP 18X18 X RAY DECT (DISPOSABLE) ×30 IMPLANT
SPONGE TONSIL 1.25 RF SGL STRG (GAUZE/BANDAGES/DRESSINGS) IMPLANT
STAPLER AUT SUT 4.8 EEAXL 21 (STAPLE) ×5 IMPLANT
STAPLER ENDO GIA 12MM SHORT (STAPLE) ×3 IMPLANT
STAPLER PROXIMATE 55 BLUE (STAPLE) IMPLANT
STAPLER PROXIMATE 75MM BLUE (STAPLE) ×5 IMPLANT
STAPLER VISISTAT 35W (STAPLE) ×5 IMPLANT
STRIP CLOSURE SKIN 1/2X4 (GAUZE/BANDAGES/DRESSINGS) ×4 IMPLANT
SUCTION POOLE TIP (SUCTIONS) ×5 IMPLANT
SUT ETHILON 2 0 FS 18 (SUTURE) ×10 IMPLANT
SUT ETHILON 3 0 FSL (SUTURE) ×5 IMPLANT
SUT MNCRL AB 4-0 PS2 18 (SUTURE) IMPLANT
SUT PDS AB 1 TP1 96 (SUTURE) ×10 IMPLANT
SUT PDS AB 3-0 SH 27 (SUTURE) ×20 IMPLANT
SUT PDS AB 4-0 SH 27 (SUTURE) IMPLANT
SUT PROLENE 0 CT 1 CR/8 (SUTURE) IMPLANT
SUT PROLENE 1 XLH (SUTURE) IMPLANT
SUT PROLENE 2 0 CT2 30 (SUTURE) ×30 IMPLANT
SUT PROLENE 2 0 MH 48 (SUTURE) IMPLANT
SUT PROLENE 2 TP 1 (SUTURE) IMPLANT
SUT PROLENE 3 0 RB 1 (SUTURE) IMPLANT
SUT PROLENE 3 0 SH DA (SUTURE) IMPLANT
SUT PROLENE 4 0 RB 1 (SUTURE)
SUT PROLENE 4-0 RB1 .5 CRCL 36 (SUTURE) IMPLANT
SUT SILK  1 MH (SUTURE)
SUT SILK 1 MH (SUTURE) IMPLANT
SUT SILK 1 TIES 10X30 (SUTURE) IMPLANT
SUT SILK 2 0 SH CR/8 (SUTURE) ×5 IMPLANT
SUT SILK 2 0 TIES 10X30 (SUTURE) ×5 IMPLANT
SUT SILK 2 0SH CR/8 30 (SUTURE) ×5 IMPLANT
SUT SILK 3 0 SH CR/8 (SUTURE) ×5 IMPLANT
SUT SILK 3 0 TIES 10X30 (SUTURE) ×5 IMPLANT
SUT SILK 3 0SH CR/8 30 (SUTURE) IMPLANT
SUT SILK 4 0 SH CR/8 (SUTURE) ×5 IMPLANT
SUT VIC AB 1 CTX 18 (SUTURE) IMPLANT
SUT VIC AB 1 CTX 27 (SUTURE) IMPLANT
SUT VIC AB 1 CTX 36 (SUTURE)
SUT VIC AB 1 CTX36XBRD ANBCTR (SUTURE) IMPLANT
SUT VIC AB 2-0 CT1 18 (SUTURE) IMPLANT
SUT VIC AB 2-0 CTX 36 (SUTURE) IMPLANT
SUT VIC AB 3-0 MH 27 (SUTURE) IMPLANT
SUT VIC AB 3-0 SH 18 (SUTURE) ×10 IMPLANT
SUT VIC AB 3-0 X1 27 (SUTURE) IMPLANT
SUT VIC AB 4-0 SH 18 (SUTURE) IMPLANT
SUT VICRYL 2 TP 1 (SUTURE) IMPLANT
SYR 20CC LL (SYRINGE) IMPLANT
SYR 30ML SLIP (SYRINGE) ×5 IMPLANT
SYR 5ML LUER SLIP (SYRINGE) IMPLANT
SYR TOOMEY 50ML (SYRINGE) IMPLANT
SYRINGE 10CC LL (SYRINGE) IMPLANT
SYSTEM SAHARA CHEST DRAIN ATS (WOUND CARE) IMPLANT
SYSTEM SAHARA CHEST DRAIN RE-I (WOUND CARE) IMPLANT
TAPE CLOTH SURG 4X10 WHT LF (GAUZE/BANDAGES/DRESSINGS) ×5 IMPLANT
TAPE UMBILICAL 1/8 X36 TWILL (MISCELLANEOUS) IMPLANT
TAPE UMBILICAL COTTON 1/8X30 (MISCELLANEOUS) IMPLANT
TOWEL OR 17X24 6PK STRL BLUE (TOWEL DISPOSABLE) ×10 IMPLANT
TOWEL OR 17X26 10 PK STRL BLUE (TOWEL DISPOSABLE) ×10 IMPLANT
TRANSORAL CIRCULAR STAPLER ANVIL 21MM ×5 IMPLANT
TRAP SPECIMEN MUCOUS 40CC (MISCELLANEOUS) IMPLANT
TRAY FOLEY CATH 14FRSI W/METER (CATHETERS) ×5 IMPLANT
TRAY LAPAROSCOPIC (CUSTOM PROCEDURE TRAY) ×5 IMPLANT
TROCAR XCEL BLUNT TIP 100MML (ENDOMECHANICALS) ×5 IMPLANT
TROCAR XCEL NON-BLD 11X100MML (ENDOMECHANICALS) IMPLANT
TROCAR XCEL NON-BLD 5MMX100MML (ENDOMECHANICALS) ×5 IMPLANT
TUBE CONNECTING 12'X1/4 (SUCTIONS) ×1
TUBE CONNECTING 12X1/4 (SUCTIONS) ×4 IMPLANT
TUBE ENDOTRAC EMG 7X10.2 (MISCELLANEOUS) IMPLANT
TUBE ENDOTRAC EMG 8X11.3 (MISCELLANEOUS) IMPLANT
TUBE ENDOTRACH  EMG 6MMTUBE EN (MISCELLANEOUS)
TUBE ENDOTRACH EMG 6MMTUBE EN (MISCELLANEOUS) IMPLANT
TUNNELER SHEATH ON-Q 11GX8 DSP (PAIN MANAGEMENT) IMPLANT
TUNNELER SHEATH ON-Q 16GX12 DP (PAIN MANAGEMENT) ×5 IMPLANT
WATER STERILE IRR 1000ML POUR (IV SOLUTION) IMPLANT
YANKAUER SUCT BULB TIP NO VENT (SUCTIONS) ×5 IMPLANT

## 2014-04-04 NOTE — Transfer of Care (Signed)
Immediate Anesthesia Transfer of Care Note  Patient: Lynn Morris  Procedure(s) Performed: Procedure(s): DIAGNOSTIC LAPAROSCOPY (N/A) TOTAL GASTRECTOMY (N/A) ESOPHAGOJEJUNOSTOMY AND FEEDING JEJUNOSTOMY (N/A)  Patient Location: PACU  Anesthesia Type:General  Level of Consciousness: awake, alert  and oriented  Airway & Oxygen Therapy: Patient connected to nasal cannula oxygen  Post-op Assessment: Report given to PACU RN  Post vital signs: stable  Complications: No apparent anesthesia complications

## 2014-04-04 NOTE — Op Note (Signed)
PRE-OPERATIVE DIAGNOSIS: gastric cancer  POST-OPERATIVE DIAGNOSIS:  Same  PROCEDURE:  Procedure(s): Diagnostic laparoscopy, total gastrectomy with roux en y esophagojejunostomy and 20 Fr red rubber feeding jejunostomy  SURGEON:  Surgeon(s): Stark Klein, MD  CO-SURGEON:  Lanelle Bal, MD  ANESTHESIA:   local and general  DRAINS: Jejunostomy Tube and (19 Fr) Blake drain(s) in the posterior to the esophagojejunostomy   LOCAL MEDICATIONS USED:  BUPIVICAINE   SPECIMEN:  Source of Specimen:  proximal stomach, portal nodes, left gastric nodes, antrum  DISPOSITION OF SPECIMEN:  PATHOLOGY  COUNTS:  YES  DICTATION: .Dragon Dictation  PLAN OF CARE: Admit to inpatient   PATIENT DISPOSITION:  ICU - extubated and stable.  EBL:  200 mL  FINDINGS:  Linitis plastica  PROCEDURE:    Pt was identified in the holding area and was taken to the OR where she was placed supine on the operating room table.  General anesthesia was induced.  The patient's arms were tucked, and a foley catheter was placed.  The abdomen was prepped and draped in sterile fashion. Timeout was performed according to the surgical safety checklist. When all was correct, we continued.   The midline was drawn out with a marking pen. Just above the umbilicus, local anesthetic was infiltrated. A 1.5 cm incision was made vertically with #11 blade. The subcutaneous tissues were spread with a Kelly clamp. The fascia was elevated with 2 Kocher clamps and incised in the midline. A pursestring suture was placed around the fascial incision. The [facility] trocar was introduced into the abdomen and held in place to the abdominal wall with the tails of the suture. Pneumoperitoneum was achieved to a pressure of 15 mm of mercury.   The camera was introduced into the abdomen and the diaphragm and peritoneal surfaces were examined carefully. There is no evidence of carcinomatosis. No omental nodules were seen. The gallbladder appeared grossly  normal.  Pneumoperitoneum was allowed to evacuate. The midline incision was carried out to the xiphoid and then extended to just below the umbilicus. This incision incorporated the trocar site. The cutaneous tissues and fascia were opened with the Bovie. A Bookwalter retractor was used to assist with visualization. The falciform was taken down partially with the harmonic scalpel. The triangular ligament of the liver was taken down with cautery. On the omentum was then addressed. The lesser sac was entered with the cautery. The omentum was taken off of the transverse colon and left attached to the stomach. The short gastrics were taken down with the harmonic scalpel. The adhesions to the spleen were quite friable. One short gastric required suture ligation as well as some Surgicel to become hemostatic.  Nodes were taken from the porta hepatis.    The cardia of the stomach appeared to be soft, but the GE junction as well as the lesser curve and a significant portion of the greater curvature was involved with tumor.  The esophagus was mobilized off of the diaphragm and the crura.  A penrose drain was used to assist with retraction.  Two stay sutures were placed on either side of the esophagus.  The esophagus was transected with a purple load of the covidien endostapler.  The left gastric pedicle was taken with a vascular load of the stapler.  The stomach was divided around 3-4 cm from the pylorus with purple loads as well.  This was sent off for frozen section margins.  Care was taken to leave the left gastroepiploic.    The esophageal margin was  clear, but the gastric margin was positive for adenocarcinoma.  The antrum was mobilized, and this was divided past the pylorus around 2 cm.  A roux-en-y esophagojejunostomy was created.  The jejunum was divided around 30 cm from the Ligament of Trietz.  The mesentery was opened just a little to allow mobilization to the crus.  A pouch was created with the jejunum with  several loads of the GIA. The anvil was passed through the mouth by anesthesia.  A small opening was created in the distal esophagus anterior to the staple line.  The tube was pulled through to the anvil.  A pursestring was placed around the anvil.  The opening of the pouch was used to advance the stapler.  The spike of the stapler was deployed at the tip of the pouch.  The anvil and the spike were coupled and the stapler was closed.  Pressure was held for around 30 seconds.  The stapler was fired, and pressure was held for another 30 seconds.  The stapler was opened 3 half turns and removed gently.  The opening in the pouch was closed with two 3-0 PDS sutures in connell fashion.    This limb was reconnected to the proximal limb in a side to side fashion with an GIA.  The defect was closed with two 3-0 PDS sutures in connell fashion.  The mesenteric defect was closed with 2-0 silk.    The J tube was advanced through the abdominal wall in the left abdomen.  A pursestring suture was placed in the jejunum around 20 cm beyond the roux en y anastamosis.  The bowel was opened and the tube was advanced.  The pursestring suture was tied down.  3-0 interrupted vicryl sutures were used to Witzel the tube.  The bowel was then secured to the abdominal wall with 2-0 silk sutures in linear fashion.  Circumferential sutures were placed right at the opening.  The tube was secured with a 2-0 nylon.    A tonsil was used to pull the Edinburgh drain through the abdominal wall on the right.  The drain was placed posteriorly to the anastamosis.  This was secured with a 2-0 nylon.  The OnQ tunnelers were placed on either side of the fascial incision.  The fascia was closed with #1 looped PDS suture in running fashion.  The skin was irrigated and closed with staples.  The onQ catheters were advanced through the tunnelers.  The wound was dressed with soft sterile dressings.    The patient was extubated and taken to the PACU in stable  condition.  Needle, sponge, and instrument counts were correct x 2.

## 2014-04-04 NOTE — H&P (Signed)
[address]       [place] [address]             [phone]                    Jalah L Wilcock [facility] Medical Record #[id number] Date of Birth: 1940/11/16  Referring: Ladell Pier, MD Primary Care: Leamon Arnt, MD  Chief Complaint:    Gastric Cancer   History of Present Illness:    Lynn Morris 73 y.o. 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 The patient has a long history of acid reflux and esophageal strictures in the past which have been dilated. In late summer 2014 she had some trouble swallowing and has noticed a 35 pound weight loss over the last 6-8 months.  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    Patient completed cycle 3 CAPOX beginning on 02/13/2014. She will complete the planned course of Xeloda on 02/27/19. The patient tolerated treatment very well losing only approximately 3 pounds. She was bothered by hand and foot symptoms. She notes her appetite is improving since stopping Xeloda.  Currently the patient has no difficulty swallowing and is taking a by mouth diet relatively well.   Followup PET scan was performed several days ago.   Current Activity/ Functional Status:  Patient is independent with mobility/ambulation, transfers, ADL's, IADL's.   Zubrod Score: At the time of surgery this patient's most appropriate activity status/level should be described as: [x]     0    Normal activity, no symptoms []     1    Restricted in physical strenuous activity but ambulatory, able to do out light work []     2    Ambulatory and capable of self care, unable to do work activities, up and about               >50 % of waking hours                              []     3    Only limited self care, in bed greater than 50% of waking hours []     4     Completely disabled, no self care, confined to bed or chair []     5    Moribund   Past Medical History  Diagnosis Date  . Esophageal stricture   . Hyperlipemia   . Arthritis     LG TOE  . Cataract     right eye and immature  . Complication of anesthesia   . PONV (postoperative nausea and vomiting)   . Hiatal hernia 12-13-13    intermittent nausea with vomiting  . Cancer 11/24/13    gastroesophageal carcinoma  . History of blood transfusion     no abnormal reaction noted  . Pneumonia     touch of it many yrs ago(>81yrs)  . History of bronchitis > 26yrs ago  . History of migraine     many yrs ago  . Esophageal reflux     was on Prilosec;has been off a yr  . Diverticulosis   . Anxiety     takes Zoloft every other day  . Optic neuritis  many yrs ago    Past Surgical History  Procedure Laterality Date  . Abdominal hysterectomy  1978    Partial   . Cesarean section  1964 & 1966  . Nose surgery  1970  . Appendectomy    . Abdominal hysterectomy    . Eus N/A 12/22/2013    Procedure: UPPER ENDOSCOPIC ULTRASOUND (EUS) LINEAR;  Surgeon: Milus Banister, MD;  Location: [facility];  Service: Endoscopy;  Laterality: N/A;  . Portacath placement N/A 12/27/2013    Procedure: INSERTION PORT-A-CATH;  Surgeon: Stark Klein, MD;  Location: [facility];  Service: General;  Laterality: N/A;  . Esophagogastroduodenoscopy      with dilitation  . Colonoscopy      Family History  Problem Relation Age of Onset  . Diabetes Mother   . Hypertension Mother   . Pancreatic cancer Mother   . Colon cancer Neg Hx     History   Social History  . Marital Status: Married    Spouse Name: N/A    Number of Children: 2  . Years of Education: N/A   Occupational History  . Retired    Social History Main Topics  . Smoking status: Never Smoker   . Smokeless tobacco: Never Used  . Alcohol Use: No  . Drug Use: No  .      Social History Narrative   2 caffeine drinks daily     History    Smoking status  . Never Smoker   Smokeless tobacco  . Never Used    History  Alcohol Use No     Allergies  Allergen Reactions  . Codeine     Cant remember    Current Facility-Administered Medications  Medication Dose Route Frequency Provider Last Rate Last Dose  . cefOXitin (MEFOXIN) 2 g in dextrose 5 % 50 mL IVPB  2 g Intravenous On Call to OR Stark Klein, MD      . cefUROXime (ZINACEF) 1.5 g in dextrose 5 % 50 mL IVPB  1.5 g Intravenous 60 min Pre-Op Grace Isaac, MD       Facility-Administered Medications Ordered in Other Encounters  Medication Dose Route Frequency Provider Last Rate Last Dose  . fentaNYL (SUBLIMAZE) injection    Anesthesia Intra-op Maeola Harman, CRNA   50 mcg at 04/04/14 [phone]  . lactated ringers infusion    Continuous PRN Maeola Harman, CRNA         Review of Systems:     Cardiac Review of Systems: Y or N  Chest Pain [  n  ]  Resting SOB [  n ] Exertional SOB  [n  ]  Vertell Limber Florencio.Farrier  ]   Pedal Edema [n   ]    Palpitations Florencio.Farrier  ] Syncope  [ n ]   Presyncope [  n ]  General Review of Systems: [Y] = yes [  ]=no Constitional: recent weight change [y 36 lbs over 6 months  ];  Wt loss over the last 3 months [   ] anorexia Blue.Reese  ]; fatigue [ n ]; nausea [n ]; night sweats [ n ]; fever [n  ]; or chills [ n ];          Dental: poor dentition[n  ]; Last Dentist visit:   Eye : blurred vision [  ]; diplopia [   ]; vision changes [  ];  Amaurosis fugax[  ]; Resp: cough [n  ];  wheezing[ n ];  hemoptysis[n  ];  shortness of breath[ n ]; paroxysmal nocturnal dyspnea[  ]; dyspnea on exertion[  ]; or orthopnea[  ];  GI:  gallstones[ n ], vomiting[n  ];  dysphagia[y  ]; melena[n  ];  hematochezia [ n ]; heartburn[  y];   Hx of  Colonoscopy[y  ]; GU: kidney stones [  ]; hematuria[  n];   dysuria [  ];  nocturia[  ];  history of     obstruction [n  ]; urinary frequency [  ]             Skin: rash, swelling[  ];, hair loss[  ];  peripheral edema[n  ];  or itching[   ]; Musculosketetal: myalgias[  ];  joint swelling[n  ];  joint erythema[ n ];  joint pain[  ];  back pain[  ];  Heme/Lymph: bruising[  ];  bleeding[  ];  anemia[  ];  Neuro: TIA[ n ];  headaches[  ];  stroke[n  ];  vertigo[  ];  seizures[n  ];   paresthesias[  ];  difficulty walking[ n ];  Psych:depression[  ]; anxiety[  ];  Endocrine: diabetes[ n ];  thyroid dysfunction[ n ];  Immunizations: Flu up to date Blue.Reese  ]; Pneumococcal up to date [ y ];  Other:  Physical Exam: BP 132/70  Pulse 64  Temp(Src) 97 F (36.1 C) (Oral)  Resp 20  Wt 119 lb (53.978 kg)  SpO2 100%  PHYSICAL EXAMINATION:  General appearance: alert, cooperative, appears older than stated age and no distress Neurologic: intact Heart: regular rate and rhythm, S1, S2 normal, no murmur, click, rub or gallop Lungs: clear to auscultation bilaterally and normal percussion bilaterally Abdomen: normal findings: aorta normal, bowel sounds normal, liver span normal to percussion, no bruits heard and no scars, striae, dilated veins, rashes, or lesions and abnormal findings:  Extremities: extremities normal, atraumatic, no cyanosis or edema, Homans sign is negative, no sign of DVT, no edema, redness or tenderness in the calves or thighs and no ulcers, gangrene or trophic changes Patient has no cervical or supraclavicular or axillary adenopathy, there are no carotid bruits pedal pulses PT and DP are full bilaterally   Diagnostic Studies & Laboratory data:     Recent Radiology Findings:  Nm Pet Image Restag (ps) Skull Base To Thigh  02/27/2014   CLINICAL DATA:  Subsequent treatment strategy for gastric cancer.  EXAM: NUCLEAR MEDICINE PET SKULL BASE TO THIGH  TECHNIQUE: 6.5 mCi F-18 FDG was injected intravenously. Full-ring PET imaging was performed from the skull base to thigh after the radiotracer. CT data was obtained and used for attenuation correction and anatomic localization.  FASTING BLOOD GLUCOSE:  Value: 95 mg/dl  COMPARISON:   12/23/2013 and CT chest abdomen pelvis 11/28/2013.  FINDINGS: NECK  No hypermetabolic lymph nodes in the neck. CT images show no acute findings.  CHEST  There is mild hypermetabolism in the hilar regions, with an SUV max on the right of 4.9. Discrete adenopathy is not well seen without IV contrast. No hypermetabolic axillary lymph nodes. No hypermetabolic pulmonary nodules. Previously seen mild hypermetabolism associated with the distal esophagus is not readily identified.  CT images show a left IJ Port-A-Cath terminating in the SVC. Coronary artery calcification. Heart size normal. No pericardial or pleural effusion. Given respiratory motion, lungs are clear.  ABDOMEN/PELVIS  No abnormal hypermetabolism in the liver, adrenal glands, spleen or pancreas. There is very mild uptake in the stomach, without a focal lesion or uptake above blood  pool. No hypermetabolic lymph nodes.  CT images show the liver, gallbladder, adrenal glands, kidneys, spleen, pancreas, stomach and bowel to be grossly unremarkable. Scattered atherosclerotic calcification of the arterial vasculature without abdominal aortic aneurysm. Trace pelvic free fluid.  SKELETON  No abnormal osseous hypermetabolism.  IMPRESSION: 1. Uptake within the proximal stomach does not appear greater than blood pool. Previously seen distal esophageal uptake is not readily identified. 2. Mild hypermetabolism in the hilar regions. Discrete adenopathy is not well seen on CT. Findings are similar to the prior exam. 3. Coronary artery calcification.   Electronically Signed   By: Lorin Picket M.D.   On: 02/27/2014 15:17  Ct Chest W Contrast/Ct Abdomen Pelvis W Contrast  11/28/2013   CLINICAL DATA:  Proximal gastric mass.  EXAM: CT CHEST, ABDOMEN, AND PELVIS WITH CONTRAST  TECHNIQUE: Multidetector CT imaging of the chest, abdomen and pelvis was performed following the standard protocol during bolus administration of intravenous contrast.  CONTRAST:  35mL OMNIPAQUE  IOHEXOL 300 MG/ML  SOLN  COMPARISON:  No priors.  FINDINGS: CT CHEST FINDINGS  Mediastinum: Heart size is normal. There is no significant pericardial fluid, thickening or pericardial calcification. There is atherosclerosis of the thoracic aorta, the great vessels of the mediastinum and the coronary arteries, including calcified atherosclerotic plaque in the left anterior descending coronary arteries. No pathologically enlarged mediastinal or hilar lymph nodes. Small hiatal hernia with some asymmetric thickening of the distal esophagus posteriorly.  Lungs/Pleura: No suspicious appearing pulmonary nodules or masses. No acute consolidative airspace disease. No pleural effusions.  Musculoskeletal: There are no aggressive appearing lytic or blastic lesions noted in the visualized portions of the skeleton.  CT ABDOMEN AND PELVIS FINDINGS  Abdomen/Pelvis: Subtle asymmetric thickening is noted along the a lesser curvature of the stomach, particularly near the gastroesophageal junction involving portions of the fundus and cardia. 6 mm gastrohepatic ligament lymph node is nonspecific. No other definite lymphadenopathy is noted in the upper abdomen.  The appearance of the liver, gallbladder, pancreas, spleen, bilateral adrenal glands and bilateral kidneys is unremarkable. No significant volume of ascites. No pneumoperitoneum. No pathologic distention of small bowel. Several colonic diverticulae are noted, particularly in the region of the sigmoid colon, without surrounding inflammatory changes to suggest an acute diverticulitis at this time. Status post hysterectomy. Ovaries are not confidently identified may be surgically absent or atrophic.  Musculoskeletal: There are no aggressive appearing lytic or blastic lesions noted in the visualized portions of the skeleton.  IMPRESSION: 1. Asymmetric soft tissue thickening involving predominantly the fundus and cardia of the stomach, concerning for potential gastric neoplasm. There  is also some mild asymmetric soft tissue thickening involving the posterior aspect of the distal esophagus. 2. No definite signs of metastatic disease in the chest, abdomen or pelvis. 3. Additional incidental findings, as above.   Electronically Signed   By: Vinnie Langton M.D.   On: 11/28/2013 12:42   Nm Pet Image Initial (pi) Skull Base To Thigh  12/23/2013   CLINICAL DATA:  Initial treatment strategy for gastric cancer.  EXAM: NUCLEAR MEDICINE PET SKULL BASE TO THIGH  TECHNIQUE: 9.1 mCi F-18 FDG was injected intravenously. Full-ring PET imaging was performed from the skull base to thigh after the radiotracer. CT data was obtained and used for attenuation correction and anatomic localization.  FASTING BLOOD GLUCOSE:  Value: 92. Mg/dl  COMPARISON:  None.  FINDINGS: NECK  No hypermetabolic lymph nodes in the neck.  CHEST  Mild nonspecific increased uptake is associated with bilateral hilar  regions. SUV max is equal to 4.2 within the right hilar region and 3.4 in the left hilar region. No significant adenopathy identified on corresponding CT images. No suspicious pulmonary nodules on the CT scan.  ABDOMEN/PELVIS  Mild FDG uptake is associated with the mild diffuse wall thickening involving the stomach. In the area of the gastric cardia the SUV max is equal to 2.6. Along the greater curvature of the stomach the SUV max is equal to 2.8. No significant FDG uptake is associated with the gastrohepatic ligament lymph node. No abnormal hypermetabolic activity within the liver, pancreas, adrenal glands, or spleen. No hypermetabolic lymph nodes in the abdomen or pelvis.  SKELETON  No focal hypermetabolic activity to suggest skeletal metastasis.  IMPRESSION: 1. There is mild diffuse increased uptake associated with the distal esophagus and wall of stomach. The SUV max associated with the wall of stomach measures up to 2.6. 2. No hypermetabolic adenopathy or evidence of distant metastatic disease from patient's biopsy  proven gastric cancer. 3. Mild increased uptake associated with bilateral hilar regions without adenopathy on corresponding CT images.   Electronically Signed   By: Kerby Moors M.D.   On: 12/23/2013 14:09      Recent Lab Findings: Lab Results  Component Value Date   WBC 4.5 03/28/2014   HGB 13.0 03/28/2014   HCT 37.5 03/28/2014   PLT 185 03/28/2014   GLUCOSE 90 03/28/2014   ALT 18 03/28/2014   AST 27 03/28/2014   NA 139 03/28/2014   K 4.4 03/28/2014   CL 105 03/28/2014   CREATININE 0.80 03/28/2014   BUN 18 03/28/2014   CO2 20 03/28/2014   INR 0.93 03/28/2014   Path: [facility] 15,1751 Diagnosis Stomach, biopsy, r/o neoplasia - POORLY DIFFERENTIATED ADENOCARCINOMA WITH SIGNET RING CELLS, SEE COMMENT. - NEGATIVE FOR HELICOBACTER PYLORI. Diagnosis Note Many of the fragments consist of gastric mucosa with mucin expanding the lamina propria with sparse atypical cells including signet ring cells. One fragment has more dense expansion of the lamina propria with poorly differentiated cells and signet ring cells, consistent with adenocarcinoma. There is background chronic gastritis.  EUS: Endoscopic findings: 1. The stomach was very poorly distensible There is obvious tumor involving about 2/3 of the stomach, circumferentially. The distal aspect of this obvious tumor is 8-9cm from the pyloris. The tumor was biopsies extensively, tunnel biopsied in several locations. EUS findings: 1. There tumor described above correlated with variably thickened, hypoechoic gastric wall by EUS. The most thickened gastric wall was in the proximal stomach were it was 1.2cm thick. It appears that the proximal edge of this tumor is above 2cm above the GE junction and then it continues into the distal stomach (7-8cm from the pyloris   Assessment / Plan:   Extensive poorly differentiated adenocarcinoma of the stomach  involving the GE junction and cardia of the stomach , without evidence of definite metastatic disease.  The  recurrent PET scan suggest good response to her chemotherapy treatment. I am in agreement with Dr. Barry Dienes to proceed gastric section/GE junction resection possibly from the abdomen only.  The goals risks and alternatives of the planned surgical procedure partial gastrectomy, poss right thoracotomy  have been discussed with the patient in detail. The risks of the procedure including death, infection, stroke, myocardial infarction, bleeding, blood transfusion have all been discussed specifically.  I have quoted Lynn Morris a 10 % of perioperative mortality and a complication rate as high as 30 %. The patient's questions have been answered.Stanton Kidney  L Goon is willing  to proceed with the planned procedure.  Grace Isaac MD      [address] [place] [address] Office [phone]   Beeper [phone]  04/04/2014 7:19 AM

## 2014-04-04 NOTE — Anesthesia Postprocedure Evaluation (Signed)
  Anesthesia Post-op Note  Patient: Lynn Morris  Procedure(s) Performed: Procedure(s): DIAGNOSTIC LAPAROSCOPY (N/A) TOTAL GASTRECTOMY (N/A) ESOPHAGOJEJUNOSTOMY AND FEEDING JEJUNOSTOMY (N/A)  Patient Location: PACU  Anesthesia Type:General  Level of Consciousness: awake, alert  and oriented  Airway and Oxygen Therapy: Patient Spontanous Breathing and Patient connected to face mask oxygen  Post-op Pain: mild  Post-op Assessment: Post-op Vital signs reviewed, Patient's Cardiovascular Status Stable, Respiratory Function Stable, Patent Airway and No signs of Nausea or vomiting  Post-op Vital Signs: stable  Last Vitals:  Filed Vitals:   04/04/14 1345  BP: 102/60  Pulse: 66  Temp:   Resp: 17    Complications: No apparent anesthesia complications

## 2014-04-04 NOTE — H&P (Signed)
  HISTORY:  Pt is a 73 yo F in great shape who has gastric cancer extending into esophagus. She completed neoadjuvant chemotherapy.  She is doing well.  She presents for surgical treatment.   PERTINENT REVIEW OF SYSTEMS:  Otherwise negative x 11.  Wt Readings from Last 3 Encounters:  04/04/14 119 lb (53.978 kg)  04/04/14 119 lb (53.978 kg)  03/31/14 119 lb (53.978 kg)   Temp Readings from Last 3 Encounters:  04/04/14 97 F (36.1 C) Oral  04/04/14 97 F (36.1 C) Oral  03/31/14 97.8 F (36.6 C) Oral   BP Readings from Last 3 Encounters:  04/04/14 132/70  04/04/14 132/70  03/31/14 106/66   Pulse Readings from Last 3 Encounters:  04/04/14 64  04/04/14 64  03/31/14 77    EXAM:  Head: Normocephalic and atraumatic.  Eyes: Conjunctivae are normal. Pupils are equal, round, and reactive to light. No scleral icterus.   Resp: No respiratory distress, normal effort.  Abd: Abdomen is soft, non distended and non tender. No masses are palpable. There is no rebound and no guarding.  Neurological: Alert and oriented to person, place, and time. Coordination normal.  Skin: Skin is warm and dry. No rash noted. No diaphoretic. No erythema. No pallor.  Psychiatric: Normal mood and affect. Normal behavior. Judgment and thought content normal.   ASSESSMENT AND PLAN:   Gastric cancer  Repeat PET scan does show decreased activity in the esophagus.  We will try to leave some stomach.    Discussed risks extensively including bleeding, infection, damage to adjacent structures, leak and need for other procedures/surgery, need for feeding tube, feeding tube complications, heart or lung problems, blood clot, death, recurrent cancer, possible abortion of procedure if carcinomatosis is found, and more. She understands and wishes to proceed.     Milus Height, MD  Surgical Oncology, [facility]   Leamon Arnt, MD   Benay Spice, MD  Servando Snare, MD

## 2014-04-04 NOTE — Anesthesia Procedure Notes (Signed)
Procedures

## 2014-04-04 NOTE — Anesthesia Preprocedure Evaluation (Addendum)
Anesthesia Evaluation  Patient identified by MRN, date of birth, ID band Patient awake    Reviewed: Allergy & Precautions, H&P , NPO status , Patient's Chart, lab work & pertinent test results, reviewed documented beta blocker date and time   History of Anesthesia Complications (+) PONV  Airway Mallampati: II TM Distance: >3 FB     Dental  (+) Edentulous Upper, Dental Advisory Given   Pulmonary pneumonia -,  breath sounds clear to auscultation        Cardiovascular Rhythm:Regular     Neuro/Psych Anxiety Depression  Neuromuscular disease    GI/Hepatic hiatal hernia, GERD-  Controlled,  Endo/Other    Renal/GU      Musculoskeletal  (+) Arthritis -,   Abdominal (+)  Abdomen: soft. Bowel sounds: normal.  Peds  Hematology   Anesthesia Other Findings   Reproductive/Obstetrics                         Anesthesia Physical Anesthesia Plan  ASA: III  Anesthesia Plan: General   Post-op Pain Management:    Induction: Intravenous  Airway Management Planned: Oral ETT  Additional Equipment:   Intra-op Plan:   Post-operative Plan: Possible Post-op intubation/ventilation  Informed Consent: I have reviewed the patients History and Physical, chart, labs and discussed the procedure including the risks, benefits and alternatives for the proposed anesthesia with the patient or authorized representative who has indicated his/her understanding and acceptance.   Dental advisory given  Plan Discussed with: CRNA and Anesthesiologist  Anesthesia Plan Comments:         Anesthesia Quick Evaluation

## 2014-04-05 LAB — PROTIME-INR
INR: 1.22 (ref 0.00–1.49)
PROTHROMBIN TIME: 15.4 s — AB (ref 11.6–15.2)

## 2014-04-05 LAB — CBC
HCT: 24.4 % — ABNORMAL LOW (ref 36.0–46.0)
Hemoglobin: 8.3 g/dL — ABNORMAL LOW (ref 12.0–15.0)
MCH: 32.3 pg (ref 26.0–34.0)
MCHC: 34 g/dL (ref 30.0–36.0)
MCV: 94.9 fL (ref 78.0–100.0)
PLATELETS: 212 10*3/uL (ref 150–400)
RBC: 2.57 MIL/uL — ABNORMAL LOW (ref 3.87–5.11)
RDW: 16 % — ABNORMAL HIGH (ref 11.5–15.5)
WBC: 10.3 10*3/uL (ref 4.0–10.5)

## 2014-04-05 LAB — BASIC METABOLIC PANEL
ANION GAP: 9 (ref 5–15)
BUN: 15 mg/dL (ref 6–23)
CALCIUM: 7.9 mg/dL — AB (ref 8.4–10.5)
CHLORIDE: 107 meq/L (ref 96–112)
CO2: 26 meq/L (ref 19–32)
Creatinine, Ser: 0.73 mg/dL (ref 0.50–1.10)
GFR calc non Af Amer: 84 mL/min — ABNORMAL LOW (ref >90)
Glucose, Bld: 135 mg/dL — ABNORMAL HIGH (ref 70–99)
Potassium: 4.4 mEq/L (ref 3.7–5.3)
SODIUM: 142 meq/L (ref 137–147)

## 2014-04-05 LAB — HEMOGLOBIN AND HEMATOCRIT, BLOOD
HEMATOCRIT: 32.1 % — AB (ref 36.0–46.0)
Hemoglobin: 10.9 g/dL — ABNORMAL LOW (ref 12.0–15.0)

## 2014-04-05 LAB — MAGNESIUM: MAGNESIUM: 1.7 mg/dL (ref 1.5–2.5)

## 2014-04-05 LAB — PHOSPHORUS: Phosphorus: 3.6 mg/dL (ref 2.3–4.6)

## 2014-04-05 LAB — PREPARE RBC (CROSSMATCH)

## 2014-04-05 MED ORDER — SODIUM CHLORIDE 0.9 % IV SOLN
Freq: Once | INTRAVENOUS | Status: AC
  Administered 2014-04-05: 09:00:00 via INTRAVENOUS

## 2014-04-05 MED ORDER — CHLORHEXIDINE GLUCONATE 0.12 % MT SOLN
15.0000 mL | Freq: Two times a day (BID) | OROMUCOSAL | Status: DC
  Administered 2014-04-05 – 2014-04-14 (×17): 15 mL via OROMUCOSAL
  Filled 2014-04-05 (×14): qty 15

## 2014-04-05 MED ORDER — CETYLPYRIDINIUM CHLORIDE 0.05 % MT LIQD
7.0000 mL | Freq: Two times a day (BID) | OROMUCOSAL | Status: DC
  Administered 2014-04-05 – 2014-04-13 (×10): 7 mL via OROMUCOSAL

## 2014-04-05 MED ORDER — PHENYLEPHRINE HCL 10 MG/ML IJ SOLN
30.0000 ug/min | INTRAMUSCULAR | Status: DC
  Administered 2014-04-05 (×2): 50 ug/min via INTRAVENOUS
  Filled 2014-04-05 (×2): qty 4

## 2014-04-05 MED ORDER — FUROSEMIDE 10 MG/ML IJ SOLN
20.0000 mg | Freq: Once | INTRAMUSCULAR | Status: AC
  Administered 2014-04-05: 20 mg via INTRAVENOUS
  Filled 2014-04-05: qty 2

## 2014-04-05 MED ORDER — IMPACT PEPTIDE 1.5 PO LIQD
1000.0000 mL | ORAL | Status: DC
  Administered 2014-04-05 – 2014-04-09 (×5): 1000 mL
  Filled 2014-04-05 (×10): qty 1000

## 2014-04-05 MED ORDER — OSMOLITE 1.2 CAL PO LIQD
1000.0000 mL | ORAL | Status: DC
  Filled 2014-04-05 (×2): qty 1000

## 2014-04-05 NOTE — Plan of Care (Signed)
Problem: Phase I Progression Outcomes Goal: Vital signs/hemodynamically stable Outcome: Progressing Currently on Neo 50 mcg and received 2 units PRBC today.

## 2014-04-05 NOTE — Progress Notes (Signed)
Patient ID: Lynn Morris, female   DOB: 1940/10/31, 73 y.o.   MRN: [id number] EVENING ROUNDS NOTE :     [address]       [place] [address]             [phone]                 1 Day Post-Op Procedure(s) (LRB): DIAGNOSTIC LAPAROSCOPY (N/A) TOTAL GASTRECTOMY (N/A) ESOPHAGOJEJUNOSTOMY AND FEEDING JEJUNOSTOMY (N/A)  Total Length of Stay:  LOS: 1 day  BP 109/57  Pulse 75  Temp(Src) 98.7 F (37.1 C) (Oral)  Resp 9  Ht 5\' 1"  (1.549 m)  Wt 121 lb 11.1 oz (55.2 kg)  BMI 23.01 kg/m2  SpO2 98%  .Intake/Output     09/16 0701 - 09/17 0700   I.V. (mL/kg) 1550.6 (28.1)   Blood 670   Other 40   NG/GT 41.5   IV Piggyback    Total Intake(mL/kg) 2302.1 (41.7)   Urine (mL/kg/hr) 2800 (3.8)   Drains 75 (0.1)   Blood    Total Output 2875   Net -572.9         . bupivacaine ON-Q pain pump    . dextrose 5 % and 0.45 % NaCl with KCl 20 mEq/L 100 mL/hr at 04/05/14 1900  . phenylephrine (NEO-SYNEPHRINE) Adult infusion 50 mcg/min (04/05/14 1900)     Lab Results  Component Value Date   WBC 10.3 04/05/2014   HGB 10.9* 04/05/2014   HCT 32.1* 04/05/2014   PLT 212 04/05/2014   GLUCOSE 135* 04/05/2014   ALT 18 03/28/2014   AST 27 03/28/2014   NA 142 04/05/2014   K 4.4 04/05/2014   CL 107 04/05/2014   CREATININE 0.73 04/05/2014   BUN 15 04/05/2014   CO2 26 04/05/2014   INR 1.22 04/05/2014   On neo got 2 units prbcs hct up to 32 Weaning neo off  Grace Isaac MD  Beeper [phone] Office [phone] 04/05/2014 8:25 PM

## 2014-04-05 NOTE — Progress Notes (Signed)
Utilization review completed. Ramya Vanbergen, RN, BSN. 

## 2014-04-05 NOTE — Progress Notes (Signed)
1 Day Post-Op  Subjective: Pt doing OK this AM.  Still requiring a bit of neosynephrine.  Good UOP overnight.  Albumin did not affect blood pressure.    Objective: Vital signs in last 24 hours: Temp:  [97.3 F (36.3 C)-98.3 F (36.8 C)] 98.3 F (36.8 C) (09/16 0400) Pulse Rate:  [58-76] 68 (09/16 0645) Resp:  [6-17] 9 (09/16 0645) BP: (69-120)/(46-98) 97/51 mmHg (09/16 0645) SpO2:  [99 %-100 %] 99 % (09/16 0645) Arterial Line BP: (59-110)/(38-74) 59/43 mmHg (09/16 0645) Weight:  [121 lb 11.1 oz (55.2 kg)] 121 lb 11.1 oz (55.2 kg) (09/16 0415)    Intake/Output from previous day: 09/15 0701 - 09/16 0700 In: 7581.4 [I.V.:6021.4; NG/GT:10; IV Piggyback:1550] Out: 3660 [Urine:2850; Drains:410; Blood:400] Intake/Output this shift:    General appearance: alert, cooperative and no distress Resp: breathing comfortably Cardio: regular rate and rhythm GI: soft, non distended, approp tender, dressing c/d/i.  drain serosang.    Lab Results:   Recent Labs  04/04/14 1736 04/05/14 0400  WBC 10.5 10.3  HGB 9.9* 8.3*  HCT 28.8* 24.4*  PLT 221 212   BMET  Recent Labs  04/04/14 1736 04/05/14 0400  NA 136* 142  K 4.5 4.4  CL 102 107  CO2 24 26  GLUCOSE 202* 135*  BUN 19 15  CREATININE 0.65 0.73  CALCIUM 7.9* 7.9*   PT/INR  Recent Labs  04/05/14 0400  LABPROT 15.4*  INR 1.22   ABG  Recent Labs  04/04/14 1108 04/04/14 1240  PHART 7.413 7.305*  HCO3 26.0* 26.5*    Studies/Results: Chest 2 View  04/04/2014   CLINICAL DATA:  Preoperative chest radiograph for esophageal cancer.  EXAM: CHEST  2 VIEW  COMPARISON:  Chest radiograph from 12/27/2013  FINDINGS: The lungs are well-aerated and clear. There is no evidence of focal opacification, pleural effusion or pneumothorax.  The heart is normal in size; the mediastinal contour is within normal limits. A left-sided chest port is noted ending at the mid SVC. No acute osseous abnormalities are seen.  IMPRESSION: No acute  cardiopulmonary process seen.   Electronically Signed   By: Garald Balding M.D.   On: 04/04/2014 06:26   Dg Chest Portable 1 View  04/04/2014   CLINICAL DATA:  Postop line placement.  EXAM: PORTABLE CHEST - 1 VIEW  COMPARISON:  04/04/2014 at 5:12 a.m.  FINDINGS: New right internal jugular central venous line has its tip just above the caval atrial junction, well positioned. No pneumothorax.  New nasogastric tube passes well below the diaphragm to curl within the stomach.  Left anterior chest wall Port-A-Cath is stable.  Mild medial lung base opacity is noted consistent with atelectasis accentuated by lower lung volumes on the current study. Lungs otherwise clear.  IMPRESSION: 1. Right internal jugular central venous line tip is well positioned just above the caval atrial junction. No pneumothorax. 2. Nasogastric tube is well positioned extending well into the stomach.   Electronically Signed   By: Lajean Manes M.D.   On: 04/04/2014 14:07    Anti-infectives: Anti-infectives   Start     Dose/Rate Route Frequency Ordered Stop   04/04/14 1600  ceFAZolin (ANCEF) IVPB 1 g/50 mL premix     1 g 100 mL/hr over 30 Minutes Intravenous Every 6 hours 04/04/14 1430 04/05/14 0421   04/04/14 0600  cefOXitin (MEFOXIN) 2 g in dextrose 5 % 50 mL IVPB     2 g 100 mL/hr over 30 Minutes Intravenous On call to O.R.  04/03/14 1421 04/04/14 0812   04/03/14 1421  cefUROXime (ZINACEF) 1.5 g in dextrose 5 % 50 mL IVPB  Status:  Discontinued     1.5 g 100 mL/hr over 30 Minutes Intravenous 60 min pre-op 04/03/14 1421 04/04/14 1425      Assessment/Plan: s/p Procedure(s): DIAGNOSTIC LAPAROSCOPY (N/A) TOTAL GASTRECTOMY (N/A) ESOPHAGOJEJUNOSTOMY AND FEEDING JEJUNOSTOMY (N/A) Continue foley due to strict I&O, patient critically ill, patient in ICU and urinary output monitoring Transfuse 2 units pRBCs for ABL anemia and hypotension. Leave in ICU. D/c art line since dampened and won't draw back. Await pathology Start  trophic feeds at 10 mL/hr.  Do not advance. Will do swallow study at POD 3-5.    LOS: 1 day    [facility] 04/05/2014

## 2014-04-05 NOTE — Progress Notes (Signed)
INITIAL NUTRITION ASSESSMENT  DOCUMENTATION CODES Per approved criteria  -Not Applicable   INTERVENTION: Initiate Impact Peptide 1.5 formula at 10 ml/hr via J-tube [facility] Study).  Advancement per MD discretion.  Recommend goal rate of 50 ml/hr to provide 1800 kcals, 113 gm protein, 924 ml of free water. RD to follow for nutrition care plan  NUTRITION DIAGNOSIS: Inadequate oral intake related to inability to eat as evidenced by NPO status  Goal: Pt to meet >/= 90% of their estimated nutrition needs   Monitor:  TF regimen & tolerance, weight, labs, I/O's  Reason for Assessment: Consult  73 y.o. 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 She completed neoadjuvant chemotherapy; presents for surgical treatment.   Patient s/p procedures 9/15: DIAGNOSTIC LAPAROSCOPY TOTAL GASTRECTOMY WITH ROUX EN Y ESOPHAGOJEJUNOSTOMY FEEDING JEJUNOSTOMY TUBE PLACEMENT  Patient has been followed by outpatient [facility] RD.  Progress notes reviewed.  Pt's weight remained stable at 119 lbs and her appetite has improved.  Patient signed consent for quality improvement trial PTA and was consuming Impact Advanced Recovery oral supplement 5 days pre-op (340 kcals, 18 gm protein per carton).  RD received telephone with readback order per Dr. Barry Dienes to change current TF formula order (Osmolite 1.2) to Impact Peptide 1.5.  Plan is to initiate at 10 ml/hr via J-tube and not advance at this time.  Height: Ht Readings from Last 1 Encounters:  04/05/14 5\' 1"  (1.549 m)    Weight: Wt Readings from Last 1 Encounters:  04/05/14 121 lb 11.1 oz (55.2 kg)    Ideal Body Weight: 105 lb  % Ideal Body Weight: 115%  Wt Readings from Last 10 Encounters:  04/05/14 121 lb 11.1 oz (55.2 kg)  04/05/14 121 lb 11.1 oz (55.2 kg)  03/31/14 119 lb (53.978 kg)  03/28/14 118 lb (53.524 kg)  03/02/14 112 lb (50.803 kg)  02/24/14 112 lb 3.2 oz (50.894 kg)   02/22/14 112 lb 4.8 oz (50.939 kg)  02/06/14 115 lb 1.6 oz (52.209 kg)  01/16/14 119 lb 9.6 oz (54.25 kg)  12/27/13 120 lb (54.432 kg)    Usual Body Weight: 120 lb  % Usual Body Weight: 101%  BMI:  Body mass index is 23.01 kg/(m^2).  Estimated Nutritional Needs: Kcal: 1800-2000 Protein: 100-115 gm Fluid: 1.8-2.0 L  Skin: chest & abdominal surgical incisions   Diet Order: NPO  EDUCATION NEEDS: -No education needs identified at this time   Intake/Output Summary (Last 24 hours) at 04/05/14 0931 Last data filed at 04/05/14 0830  Gross per 24 hour  Intake 7101.45 ml  Output   3750 ml  Net 3351.45 ml    Labs:   Recent Labs Lab 04/04/14 1240 04/04/14 1736 04/05/14 0400  NA 137 136* 142  K 4.0 4.5 4.4  CL  --  102 107  CO2  --  24 26  BUN  --  19 15  CREATININE  --  0.65 0.73  CALCIUM  --  7.9* 7.9*  MG  --   --  1.7  PHOS  --   --  3.6  GLUCOSE  --  202* 135*    CBG (last 3)   Recent Labs  04/04/14 1545 04/04/14 1932 04/04/14 2340  GLUCAP 167* 187* 160*    Scheduled Meds: . *STUDY* feeding supplement (IMPACT PEPTIDE 1.5)  1,000 mL Per Tube Q24H  . antiseptic oral rinse  7 mL Mouth Rinse q12n4p  . bupivacaine 0.25 %  ON-Q pump DUAL CATH 300 mL  300 mL Other To OR  . chlorhexidine  15 mL Mouth Rinse BID  . furosemide  20 mg Intravenous Once  . morphine   Intravenous 6 times per day    Continuous Infusions: . bupivacaine ON-Q pain pump    . dextrose 5 % and 0.45 % NaCl with KCl 20 mEq/L 100 mL/hr at 04/05/14 0800  . phenylephrine (NEO-SYNEPHRINE) Adult infusion 50 mcg/min (04/05/14 0800)    Past Medical History  Diagnosis Date  . Esophageal stricture   . Hyperlipemia   . Arthritis     LG TOE  . Cataract     right eye and immature  . Complication of anesthesia   . PONV (postoperative nausea and vomiting)   . Hiatal hernia 12-13-13    intermittent nausea with vomiting  . Cancer 11/24/13    gastroesophageal carcinoma  . History of blood  transfusion     no abnormal reaction noted  . Pneumonia     touch of it many yrs ago(>20yrs)  . History of bronchitis > 28yrs ago  . History of migraine     many yrs ago  . Esophageal reflux     was on Prilosec;has been off a yr  . Diverticulosis   . Anxiety     takes Zoloft every other day  . Optic neuritis     many yrs ago    Past Surgical History  Procedure Laterality Date  . Abdominal hysterectomy  1978    Partial   . Cesarean section  1964 & 1966  . Nose surgery  1970  . Appendectomy    . Abdominal hysterectomy    . Eus N/A 12/22/2013    Procedure: UPPER ENDOSCOPIC ULTRASOUND (EUS) LINEAR;  Surgeon: Milus Banister, MD;  Location: [facility];  Service: Endoscopy;  Laterality: N/A;  . Portacath placement N/A 12/27/2013    Procedure: INSERTION PORT-A-CATH;  Surgeon: Stark Klein, MD;  Location: [place];  Service: General;  Laterality: N/A;  . Esophagogastroduodenoscopy      with dilitation  . Colonoscopy      Arthur Holms, RD, LDN Pager #: [phone] After-Hours Pager #: [phone]

## 2014-04-05 NOTE — Plan of Care (Signed)
Problem: Phase I Progression Outcomes Goal: Pain controlled with appropriate interventions Outcome: Completed/Met Date Met:  04/05/14 Using PCA Morphine full dose

## 2014-04-06 ENCOUNTER — Encounter (HOSPITAL_COMMUNITY): Payer: Self-pay | Admitting: General Surgery

## 2014-04-06 ENCOUNTER — Ambulatory Visit: Payer: Medicare Other | Admitting: Nurse Practitioner

## 2014-04-06 LAB — BASIC METABOLIC PANEL
Anion gap: 9 (ref 5–15)
BUN: 11 mg/dL (ref 6–23)
CHLORIDE: 101 meq/L (ref 96–112)
CO2: 28 meq/L (ref 19–32)
Calcium: 8.3 mg/dL — ABNORMAL LOW (ref 8.4–10.5)
Creatinine, Ser: 0.75 mg/dL (ref 0.50–1.10)
GFR calc non Af Amer: 83 mL/min — ABNORMAL LOW (ref >90)
Glucose, Bld: 116 mg/dL — ABNORMAL HIGH (ref 70–99)
POTASSIUM: 4.4 meq/L (ref 3.7–5.3)
Sodium: 138 mEq/L (ref 137–147)

## 2014-04-06 LAB — CBC
HEMATOCRIT: 31.6 % — AB (ref 36.0–46.0)
HEMOGLOBIN: 10.7 g/dL — AB (ref 12.0–15.0)
MCH: 32.8 pg (ref 26.0–34.0)
MCHC: 33.9 g/dL (ref 30.0–36.0)
MCV: 96.9 fL (ref 78.0–100.0)
Platelets: 153 10*3/uL (ref 150–400)
RBC: 3.26 MIL/uL — AB (ref 3.87–5.11)
RDW: 16.6 % — ABNORMAL HIGH (ref 11.5–15.5)
WBC: 10.8 10*3/uL — AB (ref 4.0–10.5)

## 2014-04-06 LAB — TYPE AND SCREEN
ABO/RH(D): A POS
Antibody Screen: NEGATIVE
Unit division: 0
Unit division: 0

## 2014-04-06 MED ORDER — ACETAMINOPHEN 650 MG RE SUPP
650.0000 mg | Freq: Four times a day (QID) | RECTAL | Status: DC
  Administered 2014-04-06 – 2014-04-14 (×28): 650 mg via RECTAL
  Filled 2014-04-06 (×45): qty 1

## 2014-04-06 MED ORDER — HYDROMORPHONE HCL 1 MG/ML IJ SOLN
0.5000 mg | INTRAMUSCULAR | Status: DC | PRN
  Administered 2014-04-06 – 2014-04-07 (×4): 0.5 mg via INTRAVENOUS
  Administered 2014-04-08: 1 mg via INTRAVENOUS
  Administered 2014-04-08 (×2): 0.5 mg via INTRAVENOUS
  Administered 2014-04-08 – 2014-04-13 (×18): 1 mg via INTRAVENOUS
  Filled 2014-04-06 (×25): qty 1

## 2014-04-06 NOTE — Progress Notes (Signed)
Patient ID: Lynn Morris, female   DOB: [birth year], 73 y.o.   MRN: [id number] 2 Days Post-Op  Subjective: Pt having some delirium this AM.    Objective: Vital signs in last 24 hours: Temp:  [98 F (36.7 C)-100.4 F (38 C)] 98.2 F (36.8 C) (09/17 0800) Pulse Rate:  [62-88] 88 (09/17 0800) Resp:  [7-20] 13 (09/17 0800) BP: (63-113)/(27-71) 99/71 mmHg (09/17 0800) SpO2:  [92 %-100 %] 92 % (09/17 0800) Arterial Line BP: (58-146)/(37-142) 67/54 mmHg (09/16 1030) Weight:  [126 lb 1.7 oz (57.2 kg)] 126 lb 1.7 oz (57.2 kg) (09/17 0615)    Intake/Output from previous day: 09/16 0701 - 09/17 0700 In: 3837.8 [I.V.:2916.3; Blood:670; NG/GT:171.5] Out: 4415 [Urine:4310; Drains:105] Intake/Output this shift: Total I/O In: 120 [I.V.:100; NG/GT:20] Out: 60 [Urine:60]  General appearance: alert, and no distress, some sentences not making sense.   Resp: breathing comfortably Cardio: regular rate and rhythm GI: soft, non distended, approp tender, dressing c/d/i.  drain serosang.    Lab Results:   Recent Labs  04/05/14 0400 04/05/14 1303 04/06/14 0405  WBC 10.3  --  10.8*  HGB 8.3* 10.9* 10.7*  HCT 24.4* 32.1* 31.6*  PLT 212  --  153   BMET  Recent Labs  04/05/14 0400 04/06/14 0405  NA 142 138  K 4.4 4.4  CL 107 101  CO2 26 28  GLUCOSE 135* 116*  BUN 15 11  CREATININE 0.73 0.75  CALCIUM 7.9* 8.3*   PT/INR  Recent Labs  04/05/14 0400  LABPROT 15.4*  INR 1.22   ABG  Recent Labs  04/04/14 1108 04/04/14 1240  PHART 7.413 7.305*  HCO3 26.0* 26.5*    Studies/Results: Dg Chest Portable 1 View  04/04/2014   CLINICAL DATA:  Postop line placement.  EXAM: PORTABLE CHEST - 1 VIEW  COMPARISON:  04/04/2014 at 5:12 a.m.  FINDINGS: New right internal jugular central venous line has its tip just above the caval atrial junction, well positioned. No pneumothorax.  New nasogastric tube passes well below the diaphragm to curl within the stomach.  Left anterior chest wall  Port-A-Cath is stable.  Mild medial lung base opacity is noted consistent with atelectasis accentuated by lower lung volumes on the current study. Lungs otherwise clear.  IMPRESSION: 1. Right internal jugular central venous line tip is well positioned just above the caval atrial junction. No pneumothorax. 2. Nasogastric tube is well positioned extending well into the stomach.   Electronically Signed   By: Lajean Manes M.D.   On: 04/04/2014 14:07    Anti-infectives: Anti-infectives   Start     Dose/Rate Route Frequency Ordered Stop   04/04/14 1600  ceFAZolin (ANCEF) IVPB 1 g/50 mL premix     1 g 100 mL/hr over 30 Minutes Intravenous Every 6 hours 04/04/14 1430 04/05/14 0421   04/04/14 0600  cefOXitin (MEFOXIN) 2 g in dextrose 5 % 50 mL IVPB     2 g 100 mL/hr over 30 Minutes Intravenous On call to O.R. 04/03/14 1421 04/04/14 0812   04/03/14 1421  cefUROXime (ZINACEF) 1.5 g in dextrose 5 % 50 mL IVPB  Status:  Discontinued     1.5 g 100 mL/hr over 30 Minutes Intravenous 60 min pre-op 04/03/14 1421 04/04/14 1425      Assessment/Plan: s/p Procedure(s): DIAGNOSTIC LAPAROSCOPY (N/A) TOTAL GASTRECTOMY (N/A) ESOPHAGOJEJUNOSTOMY AND FEEDING JEJUNOSTOMY (N/A) Continue foley due to strict I&O, patient critically ill, patient in ICU and urinary output monitoring Leave in ICU. Await pathology Start  trophic feeds at 10 mL/hr.  Do not advance.   LOS: 2 days    [facility] 04/06/2014

## 2014-04-06 NOTE — Progress Notes (Signed)
Wasted 6 mg morphine in sink witnessed by K. Nicole Kindred RN.

## 2014-04-07 LAB — BASIC METABOLIC PANEL
Anion gap: 6 (ref 5–15)
BUN: 10 mg/dL (ref 6–23)
CO2: 28 meq/L (ref 19–32)
Calcium: 8.6 mg/dL (ref 8.4–10.5)
Chloride: 104 mEq/L (ref 96–112)
Creatinine, Ser: 0.65 mg/dL (ref 0.50–1.10)
GFR calc Af Amer: >90 mL/min (ref >90)
GFR calc non Af Amer: 87 mL/min — ABNORMAL LOW (ref >90)
Glucose, Bld: 97 mg/dL (ref 70–99)
Potassium: 4 mEq/L (ref 3.7–5.3)
SODIUM: 138 meq/L (ref 137–147)

## 2014-04-07 LAB — CBC
HCT: 30.1 % — ABNORMAL LOW (ref 36.0–46.0)
HEMOGLOBIN: 10.2 g/dL — AB (ref 12.0–15.0)
MCH: 32.1 pg (ref 26.0–34.0)
MCHC: 33.9 g/dL (ref 30.0–36.0)
MCV: 94.7 fL (ref 78.0–100.0)
Platelets: 141 10*3/uL — ABNORMAL LOW (ref 150–400)
RBC: 3.18 MIL/uL — AB (ref 3.87–5.11)
RDW: 15.8 % — ABNORMAL HIGH (ref 11.5–15.5)
WBC: 7.1 10*3/uL (ref 4.0–10.5)

## 2014-04-07 MED ORDER — SODIUM CHLORIDE 0.9 % IJ SOLN
3.0000 mL | Freq: Two times a day (BID) | INTRAMUSCULAR | Status: DC
  Administered 2014-04-07 – 2014-04-10 (×6): 3 mL via INTRAVENOUS

## 2014-04-07 MED ORDER — BUPIVACAINE 0.25 % ON-Q PUMP DUAL CATH 300 ML
300.0000 mL | INJECTION | Status: DC
  Filled 2014-04-07: qty 300

## 2014-04-07 MED ORDER — SODIUM CHLORIDE 0.9 % IJ SOLN
10.0000 mL | Freq: Two times a day (BID) | INTRAMUSCULAR | Status: DC
  Administered 2014-04-07 – 2014-04-10 (×5): 10 mL via INTRAVENOUS

## 2014-04-07 NOTE — Progress Notes (Signed)
Patient ID: Lynn Morris, female   DOB: Feb 17, 1941, 73 y.o.   MRN: [id number] 3 Days Post-Op  Subjective: MS better today.  Pain better controlled.     Objective: Vital signs in last 24 hours: Temp:  [98.2 F (36.8 C)-98.7 F (37.1 C)] 98.5 F (36.9 C) (09/18 0807) Pulse Rate:  [65-83] 67 (09/18 0900) Resp:  [10-22] 11 (09/18 0900) BP: (84-129)/(45-74) 89/54 mmHg (09/18 0900) SpO2:  [92 %-98 %] 94 % (09/18 0900) Weight:  [123 lb 11.2 oz (56.11 kg)] 123 lb 11.2 oz (56.11 kg) (09/18 0600)    Intake/Output from previous day: 09/17 0701 - 09/18 0700 In: 2105 [I.V.:1825; NG/GT:260] Out: 2710 [Urine:2550; Drains:160] Intake/Output this shift: Total I/O In: 180 [I.V.:150; NG/GT:30] Out: 175 [Urine:175]  General appearance: alert, and no distress, oriented.   Resp: breathing comfortably Cardio: regular rate and rhythm GI: soft, non distended, approp tender, dressing c/d/i.  drain serosang.    Lab Results:   Recent Labs  04/06/14 0405 04/07/14 0427  WBC 10.8* 7.1  HGB 10.7* 10.2*  HCT 31.6* 30.1*  PLT 153 141*   BMET  Recent Labs  04/06/14 0405 04/07/14 0427  NA 138 138  K 4.4 4.0  CL 101 104  CO2 28 28  GLUCOSE 116* 97  BUN 11 10  CREATININE 0.75 0.65  CALCIUM 8.3* 8.6   PT/INR  Recent Labs  04/05/14 0400  LABPROT 15.4*  INR 1.22   ABG  Recent Labs  04/04/14 1108 04/04/14 1240  PHART 7.413 7.305*  HCO3 26.0* 26.5*    Studies/Results: No results found.  Anti-infectives: Anti-infectives   Start     Dose/Rate Route Frequency Ordered Stop   04/04/14 1600  ceFAZolin (ANCEF) IVPB 1 g/50 mL premix     1 g 100 mL/hr over 30 Minutes Intravenous Every 6 hours 04/04/14 1430 04/05/14 0421   04/04/14 0600  cefOXitin (MEFOXIN) 2 g in dextrose 5 % 50 mL IVPB     2 g 100 mL/hr over 30 Minutes Intravenous On call to O.R. 04/03/14 1421 04/04/14 0812   04/03/14 1421  cefUROXime (ZINACEF) 1.5 g in dextrose 5 % 50 mL IVPB  Status:  Discontinued     1.5 g 100  mL/hr over 30 Minutes Intravenous 60 min pre-op 04/03/14 1421 04/04/14 1425      Assessment/Plan: s/p Procedure(s): DIAGNOSTIC LAPAROSCOPY (N/A) TOTAL GASTRECTOMY (N/A) ESOPHAGOJEJUNOSTOMY AND FEEDING JEJUNOSTOMY (N/A) D/C foley today. Decrease IVF Refill OnQ to try to minimize narcotics.   Leave in ICU for soft BPs.    LOS: 3 days    [facility] 04/07/2014

## 2014-04-07 NOTE — Progress Notes (Signed)
NUTRITION FOLLOW UP  DOCUMENTATION CODES Per approved criteria  -Not Applicable   INTERVENTION: Continue Impact Peptide 1.5 formula at 10 ml/hr via J-tube [facility] Study).  Advancement per MD discretion.  Recommend goal rate of 50 ml/hr to provide 1800 kcals, 113 gm protein, 924 ml of free water. 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 She completed neoadjuvant chemotherapy; presents for surgical treatment.   Patient s/p procedures 9/15: DIAGNOSTIC LAPAROSCOPY TOTAL GASTRECTOMY WITH ROUX EN Y ESOPHAGOJEJUNOSTOMY FEEDING JEJUNOSTOMY TUBE PLACEMENT  Patient has been followed by outpatient [facility] RD.  Progress notes reviewed.  Pt's weight remained stable at 119 lbs and her appetite has improved.  Patient signed consent for quality improvement trial PTA and was consuming Impact Advanced Recovery oral supplement 5 days pre-op (340 kcals, 18 gm protein per carton).  Patient continues on Impact Peptide 1.5 formula at 10 ml/hr via J-tube providing 360 kcals, 22 gm protein, 185 ml of free water.  Spoke with Dr. Barry Dienes; no advancement today.  Height: Ht Readings from Last 1 Encounters:  04/05/14 5\' 1"  (1.549 m)    Weight: Wt Readings from Last 1 Encounters:  04/07/14 123 lb 11.2 oz (56.11 kg)    BMI:  Body mass index is 23.39 kg/(m^2).  Estimated Nutritional Needs: Kcal: 1800-2000 Protein: 100-115 gm Fluid: 1.8-2.0 L  Skin: chest & abdominal surgical incisions   Diet Order: NPO   Intake/Output Summary (Last 24 hours) at 04/07/14 1047 Last data filed at 04/07/14 1000  Gross per 24 hour  Intake   2055 ml  Output   2890 ml  Net   -835 ml    Recent Labs Lab  04/04/14 1736 04/05/14 0400 04/06/14 0405 04/07/14 0427  NA 136* 142 138 138  K 4.5 4.4 4.4 4.0  CL 102 107 101 104  CO2 24 26 28 28   BUN 19 15 11 10   CREATININE 0.65 0.73 0.75 0.65  CALCIUM 7.9* 7.9* 8.3* 8.6  MG  --  1.7  --   --   PHOS  --  3.6  --   --   GLUCOSE 202* 135* 116* 97    CBG (last 3)   Recent Labs  04/04/14 1545 04/04/14 1932 04/04/14 2340  GLUCAP 167* 187* 160*    Scheduled Meds: . *STUDY* feeding supplement (IMPACT PEPTIDE 1.5)  1,000 mL Per Tube Q24H  . acetaminophen  650 mg Rectal Q6H  . antiseptic oral rinse  7 mL Mouth Rinse q12n4p  . chlorhexidine  15 mL Mouth Rinse BID    Continuous Infusions: . bupivacaine 0.25 % ON-Q pump DUAL CATH 300 mL    . bupivacaine ON-Q pain pump    . dextrose 5 % and 0.45 % NaCl with KCl 20 mEq/L 50 mL/hr at 04/07/14 0953  . phenylephrine (NEO-SYNEPHRINE) Adult infusion Stopped (04/06/14 0500)    Past Medical History  Diagnosis Date  . Esophageal stricture   . Hyperlipemia   . Arthritis     LG TOE  . Cataract     right eye and immature  . Complication of anesthesia   . PONV (postoperative nausea and vomiting)   . Hiatal hernia 12-13-13    intermittent nausea with vomiting  .  Cancer 11/24/13    gastroesophageal carcinoma  . History of blood transfusion     no abnormal reaction noted  . Pneumonia     touch of it many yrs ago(>39yrs)  . History of bronchitis > 6yrs ago  . History of migraine     many yrs ago  . Esophageal reflux     was on Prilosec;has been off a yr  . Diverticulosis   . Anxiety     takes Zoloft every other day  . Optic neuritis     many yrs ago    Past Surgical History  Procedure Laterality Date  . Abdominal hysterectomy  1978    Partial   . Cesarean section  1964 & 1966  . Nose surgery  1970  . Appendectomy    . Abdominal hysterectomy    . Eus N/A 12/22/2013    Procedure: UPPER ENDOSCOPIC ULTRASOUND (EUS) LINEAR;  Surgeon: Milus Banister, MD;  Location: [facility];   Service: Endoscopy;  Laterality: N/A;  . Portacath placement N/A 12/27/2013    Procedure: INSERTION PORT-A-CATH;  Surgeon: Stark Klein, MD;  Location: [place];  Service: General;  Laterality: N/A;  . Esophagogastroduodenoscopy      with dilitation  . Colonoscopy    . Laparoscopy N/A 04/04/2014    Procedure: DIAGNOSTIC LAPAROSCOPY;  Surgeon: Stark Klein, MD;  Location: [place];  Service: General;  Laterality: N/A;  . Partial gastrectomy N/A 04/04/2014    Procedure: TOTAL GASTRECTOMY;  Surgeon: Stark Klein, MD;  Location: [place];  Service: General;  Laterality: N/A;  . Jejunostomy N/A 04/04/2014    Procedure: ESOPHAGOJEJUNOSTOMY AND FEEDING JEJUNOSTOMY;  Surgeon: Stark Klein, MD;  Location: [place];  Service: General;  Laterality: N/A;    Arthur Holms, RD, LDN Pager #: [phone] After-Hours Pager #: [phone]

## 2014-04-08 LAB — CBC
HCT: 32.4 % — ABNORMAL LOW (ref 36.0–46.0)
Hemoglobin: 11.1 g/dL — ABNORMAL LOW (ref 12.0–15.0)
MCH: 31.8 pg (ref 26.0–34.0)
MCHC: 34.3 g/dL (ref 30.0–36.0)
MCV: 92.8 fL (ref 78.0–100.0)
Platelets: 176 10*3/uL (ref 150–400)
RBC: 3.49 MIL/uL — ABNORMAL LOW (ref 3.87–5.11)
RDW: 14.9 % (ref 11.5–15.5)
WBC: 6.1 10*3/uL (ref 4.0–10.5)

## 2014-04-08 LAB — BASIC METABOLIC PANEL
Anion gap: 8 (ref 5–15)
BUN: 12 mg/dL (ref 6–23)
CHLORIDE: 107 meq/L (ref 96–112)
CO2: 27 mEq/L (ref 19–32)
CREATININE: 0.67 mg/dL (ref 0.50–1.10)
Calcium: 8.7 mg/dL (ref 8.4–10.5)
GFR calc Af Amer: >90 mL/min (ref >90)
GFR calc non Af Amer: 86 mL/min — ABNORMAL LOW (ref >90)
Glucose, Bld: 104 mg/dL — ABNORMAL HIGH (ref 70–99)
Potassium: 4.2 mEq/L (ref 3.7–5.3)
Sodium: 142 mEq/L (ref 137–147)

## 2014-04-08 NOTE — Progress Notes (Signed)
4 Days Post-Op  Subjective: WALKING WELL.  BP BETTER.  FOLEY OUT  Objective: Vital signs in last 24 hours: Temp:  [97.7 F (36.5 C)-98.4 F (36.9 C)] 97.8 F (36.6 C) (09/19 0700) Pulse Rate:  [68-82] 75 (09/19 0800) Resp:  [11-22] 15 (09/19 0800) BP: (90-139)/(53-86) 126/86 mmHg (09/19 0800) SpO2:  [94 %-100 %] 98 % (09/19 0800)    Intake/Output from previous day: 09/18 0701 - 09/19 0700 In: 1460 [I.V.:1210; NG/GT:250] Out: 2665 [Urine:1995; Emesis/NG output:400; Drains:270] Intake/Output this shift: Total I/O In: 120 [I.V.:50; Other:30; NG/GT:40] Out: -   Incision/Wound:C/D/I  Tube sites clean on q in place  Lab Results:   Recent Labs  04/07/14 0427 04/08/14 0423  WBC 7.1 6.1  HGB 10.2* 11.1*  HCT 30.1* 32.4*  PLT 141* 176   BMET  Recent Labs  04/07/14 0427 04/08/14 0423  NA 138 142  K 4.0 4.2  CL 104 107  CO2 28 27  GLUCOSE 97 104*  BUN 10 12  CREATININE 0.65 0.67  CALCIUM 8.6 8.7   PT/INR No results found for this basename: LABPROT, INR,  in the last 72 hours ABG No results found for this basename: PHART, PCO2, PO2, HCO3,  in the last 72 hours  Studies/Results: No results found.  Anti-infectives: Anti-infectives   Start     Dose/Rate Route Frequency Ordered Stop   04/04/14 1600  ceFAZolin (ANCEF) IVPB 1 g/50 mL premix     1 g 100 mL/hr over 30 Minutes Intravenous Every 6 hours 04/04/14 1430 04/05/14 0421   04/04/14 0600  cefOXitin (MEFOXIN) 2 g in dextrose 5 % 50 mL IVPB     2 g 100 mL/hr over 30 Minutes Intravenous On call to O.R. 04/03/14 1421 04/04/14 0812   04/03/14 1421  cefUROXime (ZINACEF) 1.5 g in dextrose 5 % 50 mL IVPB  Status:  Discontinued     1.5 g 100 mL/hr over 30 Minutes Intravenous 60 min pre-op 04/03/14 1421 04/04/14 1425      Assessment/Plan: s/p Procedure(s): DIAGNOSTIC LAPAROSCOPY (N/A) TOTAL GASTRECTOMY (N/A) ESOPHAGOJEJUNOSTOMY AND FEEDING JEJUNOSTOMY (N/A) Looks great and BP better. Ambulating well and foley  out.  Transfer to floor and cont TF.   LOS: 4 days    Neida Ellegood A. 04/08/2014

## 2014-04-09 LAB — BASIC METABOLIC PANEL
Anion gap: 12 (ref 5–15)
BUN: 16 mg/dL (ref 6–23)
CO2: 26 mEq/L (ref 19–32)
Calcium: 9.1 mg/dL (ref 8.4–10.5)
Chloride: 105 mEq/L (ref 96–112)
Creatinine, Ser: 0.71 mg/dL (ref 0.50–1.10)
GFR calc Af Amer: >90 mL/min (ref >90)
GFR, EST NON AFRICAN AMERICAN: 85 mL/min — AB (ref >90)
Glucose, Bld: 104 mg/dL — ABNORMAL HIGH (ref 70–99)
Potassium: 4.5 mEq/L (ref 3.7–5.3)
Sodium: 143 mEq/L (ref 137–147)

## 2014-04-09 LAB — CBC
HCT: 34.4 % — ABNORMAL LOW (ref 36.0–46.0)
Hemoglobin: 11.9 g/dL — ABNORMAL LOW (ref 12.0–15.0)
MCH: 32.1 pg (ref 26.0–34.0)
MCHC: 34.6 g/dL (ref 30.0–36.0)
MCV: 92.7 fL (ref 78.0–100.0)
Platelets: 191 10*3/uL (ref 150–400)
RBC: 3.71 MIL/uL — ABNORMAL LOW (ref 3.87–5.11)
RDW: 14.5 % (ref 11.5–15.5)
WBC: 6.6 10*3/uL (ref 4.0–10.5)

## 2014-04-09 NOTE — Progress Notes (Signed)
5 Days Post-Op  Subjective: Now on [place] unit.  Doing well. Ambulating halls yesterday .VSS. No flatus or stool yet.  Voiding without difficulty. NG drainage bilious. No problems with impact peptide tube feeding supplement. Still at 10 cc/hour  Hemoglobin 11.9. WBC 6600. Potassium 4.5. Creatinine 0.71. Glucose 104.  Objective: Vital signs in last 24 hours: Temp:  [97.4 F (36.3 C)-98 F (36.7 C)] 97.8 F (36.6 C) (09/20 0527) Pulse Rate:  [71-84] 82 (09/20 0527) Resp:  [12-25] 16 (09/20 0527) BP: (109-132)/(61-78) 121/71 mmHg (09/20 0527) SpO2:  [92 %-100 %] 95 % (09/20 0527)    Intake/Output from previous day: 09/19 0701 - 09/20 0700 In: 750 [I.V.:450; NG/GT:180] Out: 1850 [Urine:1425; Emesis/NG output:250; Drains:175] Intake/Output this shift:    General appearance: alert. Cooperative. Mental status normal. No distress. Looks quite good considering magnitude of surgery. Resp: clear to auscultation bilaterally GI: abdomen soft. Generally silent. Wound clean. On-Q in place. J-tube site looks good.  Drainage thin, serosanguineous.  Lab Results:  Results for orders placed during the hospital encounter of 04/04/14 (from the past 24 hour(s))  CBC     Status: Abnormal   Collection Time    04/09/14  5:53 AM      Result Value Ref Range   WBC 6.6  4.0 - 10.5 K/uL   RBC 3.71 (*) 3.87 - 5.11 MIL/uL   Hemoglobin 11.9 (*) 12.0 - 15.0 g/dL   HCT 34.4 (*) 36.0 - 46.0 %   MCV 92.7  78.0 - 100.0 fL   MCH 32.1  26.0 - 34.0 pg   MCHC 34.6  30.0 - 36.0 g/dL   RDW 14.5  11.5 - 15.5 %   Platelets 191  150 - 400 K/uL  BASIC METABOLIC PANEL     Status: Abnormal   Collection Time    04/09/14  5:53 AM      Result Value Ref Range   Sodium 143  137 - 147 mEq/L   Potassium 4.5  3.7 - 5.3 mEq/L   Chloride 105  96 - 112 mEq/L   CO2 26  19 - 32 mEq/L   Glucose, Bld 104 (*) 70 - 99 mg/dL   BUN 16  6 - 23 mg/dL   Creatinine, Ser 0.71  0.50 - 1.10 mg/dL   Calcium 9.1  8.4 - 10.5  mg/dL   GFR calc non Af Amer 85 (*) >90 mL/min   GFR calc Af Amer >90  >90 mL/min   Anion gap 12  5 - 15     Studies/Results: No results found.  . *STUDY* feeding supplement (IMPACT PEPTIDE 1.5)  1,000 mL Per Tube Q24H  . acetaminophen  650 mg Rectal Q6H  . antiseptic oral rinse  7 mL Mouth Rinse q12n4p  . chlorhexidine  15 mL Mouth Rinse BID  . sodium chloride  10 mL Intravenous Q12H  . sodium chloride  3 mL Intravenous Q12H     Assessment/Plan: s/p Procedure(s): DIAGNOSTIC LAPAROSCOPY TOTAL GASTRECTOMY ESOPHAGOJEJUNOSTOMY AND FEEDING JEJUNOSTOMY  POD#5. Total gastrectomy and feeding jejunostomy Stable Ambulate in halls Continued NG tube and enteral nutritional supplement Check pathology  @PROBHOSP @  LOS: 5 days    Andilynn Delavega M 04/09/2014  . .prob

## 2014-04-10 ENCOUNTER — Inpatient Hospital Stay (HOSPITAL_COMMUNITY): Payer: Medicare Other

## 2014-04-10 MED ORDER — IOHEXOL 300 MG/ML  SOLN
150.0000 mL | Freq: Once | INTRAMUSCULAR | Status: AC | PRN
  Administered 2014-04-10: 50 mL via ORAL

## 2014-04-10 MED ORDER — SODIUM CHLORIDE 0.9 % IJ SOLN: 10.0000 mL | Freq: Two times a day (BID) | INTRAMUSCULAR | Status: DC

## 2014-04-10 MED ORDER — IMPACT PEPTIDE 1.5 PO LIQD
1000.0000 mL | ORAL | Status: DC
Start: 2014-04-10 — End: 2014-04-12
  Administered 2014-04-10 – 2014-04-12 (×5): 1000 mL
  Filled 2014-04-10 (×4): qty 1000

## 2014-04-10 MED ORDER — SODIUM CHLORIDE 0.9 % IJ SOLN
10.0000 mL | INTRAMUSCULAR | Status: DC | PRN
  Administered 2014-04-10 – 2014-04-13 (×6): 10 mL

## 2014-04-10 NOTE — Progress Notes (Signed)
NUTRITION FOLLOW UP  INTERVENTION:  Impact Peptide 1.5 formula ([facility] Pilot Study) advancement per MD discretion; recommend goal rate of 50 ml/hr to provide 1800 kcals, 113 gm protein, 924 ml of free water RD to follow for nutrition care plan  NUTRITION DIAGNOSIS: Inadequate oral intake related to inability to eat as evidenced by NPO status, ongoing  Goal: Pt to meet >/= 90% of their estimated nutrition needs, progressing  Monitor:  TF regimen & tolerance, weight, labs, I/O's  ASSESSMENT: 73 yo Female with gastric cancer extending into esophagus. She completed neoadjuvant chemotherapy; presents for surgical treatment.   Patient s/p procedures 9/15: DIAGNOSTIC LAPAROSCOPY TOTAL GASTRECTOMY WITH ROUX EN Y ESOPHAGOJEJUNOSTOMY FEEDING JEJUNOSTOMY TUBE PLACEMENT  Patient has been followed by outpatient [facility] RD.  Progress notes reviewed.  Pt's weight remained stable at 119 lbs and her appetite has improved.  Patient signed consent for quality improvement trial PTA and was consuming Impact Advanced Recovery oral supplement 5 days pre-op (340 kcals, 18 gm protein per carton).  Impact Peptide 1.5 formula advanced to 20 ml/hr via J-tube today.  Current TF regimen is providing 720 kcals, 45 gm protein, 369 ml of free water.   Height: Ht Readings from Last 1 Encounters:  04/05/14 5\' 1"  (1.549 m)    Weight: Wt Readings from Last 1 Encounters:  04/07/14 123 lb 11.2 oz (56.11 kg)    09/17  126 lb 09/16  121 lb 09/15  119 lb  BMI:  Body mass index is 23.39 kg/(m^2).  Estimated Nutritional Needs: Kcal: 1800-2000 Protein: 100-115 gm Fluid: 1.8-2.0 L  Skin: chest & abdominal surgical incisions   Diet Order: NPO   Intake/Output Summary (Last 24 hours) at 04/10/14 1453 Last data filed at 04/10/14 1452  Gross per 24 hour  Intake      3 ml  Output   1570 ml  Net  -1567 ml    Recent Labs Lab 04/04/14 1736 04/05/14 0400  04/07/14 0427 04/08/14 0423 04/09/14 0553  NA 136*  142  < > 138 142 143  K 4.5 4.4  < > 4.0 4.2 4.5  CL 102 107  < > 104 107 105  CO2 24 26  < > 28 27 26   BUN 19 15  < > 10 12 16   CREATININE 0.65 0.73  < > 0.65 0.67 0.71  CALCIUM 7.9* 7.9*  < > 8.6 8.7 9.1  MG  --  1.7  --   --   --   --   PHOS  --  3.6  --   --   --   --   GLUCOSE 202* 135*  < > 97 104* 104*  < > = values in this interval not displayed.  Scheduled Meds: . *STUDY* feeding supplement (IMPACT PEPTIDE 1.5)  1,000 mL Per Tube Q24H  . acetaminophen  650 mg Rectal Q6H  . antiseptic oral rinse  7 mL Mouth Rinse q12n4p  . chlorhexidine  15 mL Mouth Rinse BID  . sodium chloride  10 mL Intravenous Q12H  . sodium chloride  3 mL Intravenous Q12H    Continuous Infusions: . dextrose 5 % and 0.45 % NaCl with KCl 20 mEq/L 50 mL/hr at 04/10/14 0722  . phenylephrine (NEO-SYNEPHRINE) Adult infusion Stopped (04/06/14 0500)    Past Medical History  Diagnosis Date  . Esophageal stricture   . Hyperlipemia   . Arthritis     LG TOE  . Cataract     right eye and immature  .  Complication of anesthesia   . PONV (postoperative nausea and vomiting)   . Hiatal hernia 12-13-13    intermittent nausea with vomiting  . Cancer 11/24/13    gastroesophageal carcinoma  . History of blood transfusion     no abnormal reaction noted  . Pneumonia     touch of it many yrs ago(>37yrs)  . History of bronchitis > 31yrs ago  . History of migraine     many yrs ago  . Esophageal reflux     was on Prilosec;has been off a yr  . Diverticulosis   . Anxiety     takes Zoloft every other day  . Optic neuritis     many yrs ago    Past Surgical History  Procedure Laterality Date  . Abdominal hysterectomy  1978    Partial   . Cesarean section  1964 & 1966  . Nose surgery  1970  . Appendectomy    . Abdominal hysterectomy    . Eus N/A 12/22/2013    Procedure: UPPER ENDOSCOPIC ULTRASOUND (EUS) LINEAR;  Surgeon: Milus Banister, MD;  Location: [facility];  Service: Endoscopy;  Laterality: N/A;  .  Portacath placement N/A 12/27/2013    Procedure: INSERTION PORT-A-CATH;  Surgeon: Stark Klein, MD;  Location: [place];  Service: General;  Laterality: N/A;  . Esophagogastroduodenoscopy      with dilitation  . Colonoscopy    . Laparoscopy N/A 04/04/2014    Procedure: DIAGNOSTIC LAPAROSCOPY;  Surgeon: Stark Klein, MD;  Location: [place];  Service: General;  Laterality: N/A;  . Partial gastrectomy N/A 04/04/2014    Procedure: TOTAL GASTRECTOMY;  Surgeon: Stark Klein, MD;  Location: [place];  Service: General;  Laterality: N/A;  . Jejunostomy N/A 04/04/2014    Procedure: ESOPHAGOJEJUNOSTOMY AND FEEDING JEJUNOSTOMY;  Surgeon: Stark Klein, MD;  Location: [place];  Service: General;  Laterality: N/A;    Arthur Holms, RD, LDN Pager #: [phone] After-Hours Pager #: [phone]

## 2014-04-10 NOTE — Progress Notes (Signed)
Patient ID: Lynn Morris, female   DOB: 06-Jul-1941, 73 y.o.   MRN: [id number]      [address]       [place], [address]             [phone]                 6 Days Post-Op Procedure(s) (LRB): DIAGNOSTIC LAPAROSCOPY (N/A) TOTAL GASTRECTOMY (N/A) ESOPHAGOJEJUNOSTOMY AND FEEDING JEJUNOSTOMY (N/A)  LOS: 6 days   Subjective: Feels well, NG tube in place   Objective: Vital signs in last 24 hours: Patient Vitals for the past 24 hrs:  BP Temp Temp src Pulse Resp SpO2  04/10/14 1352 133/58 mmHg 98.4 F (36.9 C) Oral 80 15 96 %  04/10/14 0518 121/89 mmHg 98 F (36.7 C) Oral 84 17 94 %  04/09/14 2142 125/74 mmHg 97.8 F (36.6 C) Oral 80 15 96 %    Filed Weights   04/05/14 0415 04/06/14 0615 04/07/14 0600  Weight: 121 lb 11.1 oz (55.2 kg) 126 lb 1.7 oz (57.2 kg) 123 lb 11.2 oz (56.11 kg)    Hemodynamic parameters for last 24 hours:    Intake/Output from previous day: 09/20 0701 - 09/21 0700 In: 3 [I.V.:3] Out: 1015 [Urine:650; Emesis/NG output:300; Drains:65] Intake/Output this shift: Total I/O In: 0  Out: 555 [Urine:200; Emesis/NG output:300; Drains:55]  Scheduled Meds: . *STUDY* feeding supplement (IMPACT PEPTIDE 1.5)  1,000 mL Per Tube Q24H  . acetaminophen  650 mg Rectal Q6H  . antiseptic oral rinse  7 mL Mouth Rinse q12n4p  . chlorhexidine  15 mL Mouth Rinse BID  . sodium chloride  10 mL Intravenous Q12H  . sodium chloride  3 mL Intravenous Q12H   Continuous Infusions: . dextrose 5 % and 0.45 % NaCl with KCl 20 mEq/L 50 mL/hr at 04/10/14 0722  . phenylephrine (NEO-SYNEPHRINE) Adult infusion Stopped (04/06/14 0500)   PRN Meds:.HYDROmorphone (DILAUDID) injection  General appearance: alert and cooperative Neurologic: intact Heart: regular rate and rhythm, S1, S2 normal, no murmur, click, rub or gallop Lungs: clear to auscultation bilaterally Abdomen: soft, non-tender; bowel sounds normal; no masses,  no organomegaly Extremities: extremities  normal, atraumatic, no cyanosis or edema and Homans sign is negative, no sign of DVT Wound ok  Lab Results: CBC: Recent Labs  04/08/14 0423 04/09/14 0553  WBC 6.1 6.6  HGB 11.1* 11.9*  HCT 32.4* 34.4*  PLT 176 191   BMET:  Recent Labs  04/08/14 0423 04/09/14 0553  NA 142 143  K 4.2 4.5  CL 107 105  CO2 27 26  GLUCOSE 104* 104*  BUN 12 16  CREATININE 0.67 0.71  CALCIUM 8.7 9.1    PT/INR: No results found for this basename: LABPROT, INR,  in the last 72 hours   Radiology Dg Ugi W/water Sol Cm  04/10/2014   CLINICAL DATA:  Postop total gastrectomy 04/04/2014 for malignancy. Evaluate for leak at esophagojejunostomy.  EXAM: WATER SOLUBLE UPPER GI SERIES  TECHNIQUE: Single-column upper GI series was performed using water soluble contrast.  CONTRAST:  11mL OMNIPAQUE IOHEXOL 300 MG/ML  SOLN  COMPARISON:  PET-CT 02/27/2014.  FLUOROSCOPY TIME:  36 seconds  FINDINGS: The scout abdominal radiograph demonstrates a nasogastric tube tip in the left mid abdomen and surgical drains. There is no evidence of bowel obstruction.  Patient swallowed the contrast without difficulty. The esophageal motility is normal. The esophagojejunostomy is widely patent. There is no evidence of leak at the anastomosis. Contrast passes into  the proximal jejunum. There is no bowel distention or evidence of bowel obstruction.  IMPRESSION: No demonstrated complication following total gastrectomy and esophagojejunostomy. No evidence of leak.   Electronically Signed   By: Camie Patience M.D.   On: 04/10/2014 11:03     Assessment/Plan: S/P Procedure(s) (LRB): DIAGNOSTIC LAPAROSCOPY (N/A) TOTAL GASTRECTOMY (N/A) ESOPHAGOJEJUNOSTOMY AND FEEDING JEJUNOSTOMY (N/A) Doing well after total gastrectomy, swallow ok, ng tube out soon per GS    Grace Isaac MD 04/10/2014 3:25 PM

## 2014-04-10 NOTE — Op Note (Signed)
NAME:  MONAY, HOULTON NO.:  [id number]  MEDICAL RECORD NO.:  [id number]  LOCATION:                                 FACILITY:  PHYSICIAN:  Lanelle Bal, MD    DATE OF BIRTH:  10-13-1940  DATE OF PROCEDURE:  04/05/2014 DATE OF DISCHARGE:                              OPERATIVE REPORT   PREOPERATIVE DIAGNOSIS:  Gastric carcinoma.  PROCEDURE PERFORMED:  Total esophagogastrectomy.  Total gastrectomy with Roux-en-Y jejunal pouch reconstruction and feeding jejunostomy tube  CO-SURGEON:  Stark Klein, MD, General Surgery and Lanelle Bal, MD, Thoracic Surgery.  BRIEF HISTORY:  The patient is a 73 year old female who was diagnosed with gastric cancer primarily involving the upper portion of her stomach in question at the GE junction.  She had been referred to Dr. Barry Dienes for consideration of resection and because of the proximity of the gastric cancer to the GE junction in question of requiring total gastrectomy or partial esophagogastrectomy and needing to be into the chest.  I was asked to be a co-surgeon on the procedure.  DESCRIPTION OF PROCEDURE:  The patient underwent general endotracheal anesthesia without incident.  Appropriate time-out was performed.  Dr. Marlowe Aschoff note describes the initial incision and exploration of the abdomen.  The proximal 2/3rd of the stomach was very thickened consistent with tumor.  The esophageal hiatus and very distal esophagus felt fairly normal.  Dr. Barry Dienes describes the dissection of the stomach and taking the short gastrics.  We then got to the esophageal hiatus where the hiatus was opened identifying the crus and aorta.  A Penrose drain was encircled around the distal esophagus and we dissected up approximately 2-3 cm into the posterior mediastinum.  Initially, we considered a resection of the proximal 2/3rd of the stomach.  The esophagus was divided with the GIA stapler at approximately 2-3 cm above the GE junction.   Stay sutures had been placed in the esophagus for controlling the stapler.  It was described in Dr. Marlowe Aschoff note the left gastric artery and vein were divided with a vascular stapler and the proximal 2/3rd of the stomach was resected.  Unfortunately, the gastric margin was positive and was elected to proceed with total gastrectomy and creation of her jejunal pouch and Roux-en-Y reconstruction as described in her note.  With the jejunal pouch created, a 21 mm circular Covidien stapler was selected.  The anvil was passed from the mouth with the small tube.  Small incision was made in the esophageal staple line and the anvil positioned, and the tube removed.  The anvil brought up through the jejunal pouch was then attached to the anvil and a circular staple line was created from the jejunal pouch to the distal esophagus. With removal of this, 2 complete rings were carefully identified.  The esophageal margin ring was sent as final esophageal margin.  Interrupted silk sutures were placed circumferentially around the anastomosis to keep tension off of it.  As described by Dr. Marlowe Aschoff note, the remainder of the completion of the jejunal pouch and closure and Roux-en- Y reconstruction was described.  A feeding jejunostomy tube was also placed.  Prior to her complete  closure of the pouch, we confirmed an NG tube passed through the anastomosis.  With esophageal margin being negative, we did not need to proceed into the chest.  Sponge and needle count was reported as correct at completion. Dr. Marlowe Aschoff describes the closure of the abdomen.     Lanelle Bal, MD     EG/MEDQ  D:  04/10/2014  T:  04/10/2014  Job:  [id number]

## 2014-04-11 NOTE — Progress Notes (Signed)
Patient ID: Lynn Morris, female   DOB: 04-18-1941, 73 y.o.   MRN: [id number] 7 Days Post-Op  Subjective: Swallow yesterday did not demonstrate a leak.  NGT removed and clears started.  No n/v.  Still with some throat pain.  .     Objective: Vital signs in last 24 hours: Temp:  [98 F (36.7 C)-98.4 F (36.9 C)] 98.2 F (36.8 C) (09/22 0631) Pulse Rate:  [68-80] 76 (09/22 0631) Resp:  [15-16] 16 (09/22 0631) BP: (100-133)/(57-66) 100/57 mmHg (09/22 0631) SpO2:  [96 %-97 %] 97 % (09/22 0631) Last BM Date: 04/03/14  Intake/Output from previous day: 09/21 0701 - 09/22 0700 In: 870 [I.V.:610; NG/GT:240] Out: 815 [Urine:400; Emesis/NG output:300; Drains:115] Intake/Output this shift:    General appearance: alert, and no distress, oriented.   Resp: breathing comfortably Cardio: regular rate and rhythm GI: soft, non distended, approp tender, dressing c/d/i.  drain serosang.    Lab Results:   Recent Labs  04/09/14 0553  WBC 6.6  HGB 11.9*  HCT 34.4*  PLT 191   BMET  Recent Labs  04/09/14 0553  NA 143  K 4.5  CL 105  CO2 26  GLUCOSE 104*  BUN 16  CREATININE 0.71  CALCIUM 9.1   PT/INR No results found for this basename: LABPROT, INR,  in the last 72 hours ABG No results found for this basename: PHART, PCO2, PO2, HCO3,  in the last 72 hours  Studies/Results: Dg Ugi W/water Sol Cm  04/10/2014   CLINICAL DATA:  Postop total gastrectomy 04/04/2014 for malignancy. Evaluate for leak at esophagojejunostomy.  EXAM: WATER SOLUBLE UPPER GI SERIES  TECHNIQUE: Single-column upper GI series was performed using water soluble contrast.  CONTRAST:  26mL OMNIPAQUE IOHEXOL 300 MG/ML  SOLN  COMPARISON:  PET-CT 02/27/2014.  FLUOROSCOPY TIME:  36 seconds  FINDINGS: The scout abdominal radiograph demonstrates a nasogastric tube tip in the left mid abdomen and surgical drains. There is no evidence of bowel obstruction.  Patient swallowed the contrast without difficulty. The esophageal motility  is normal. The esophagojejunostomy is widely patent. There is no evidence of leak at the anastomosis. Contrast passes into the proximal jejunum. There is no bowel distention or evidence of bowel obstruction.  IMPRESSION: No demonstrated complication following total gastrectomy and esophagojejunostomy. No evidence of leak.   Electronically Signed   By: Camie Patience M.D.   On: 04/10/2014 11:03    Anti-infectives: Anti-infectives   Start     Dose/Rate Route Frequency Ordered Stop   04/04/14 1600  ceFAZolin (ANCEF) IVPB 1 g/50 mL premix     1 g 100 mL/hr over 30 Minutes Intravenous Every 6 hours 04/04/14 1430 04/05/14 0421   04/04/14 0600  cefOXitin (MEFOXIN) 2 g in dextrose 5 % 50 mL IVPB     2 g 100 mL/hr over 30 Minutes Intravenous On call to O.R. 04/03/14 1421 04/04/14 0812   04/03/14 1421  cefUROXime (ZINACEF) 1.5 g in dextrose 5 % 50 mL IVPB  Status:  Discontinued     1.5 g 100 mL/hr over 30 Minutes Intravenous 60 min pre-op 04/03/14 1421 04/04/14 1425      Assessment/Plan: s/p Procedure(s): DIAGNOSTIC LAPAROSCOPY (N/A) TOTAL GASTRECTOMY (N/A) ESOPHAGOJEJUNOSTOMY AND FEEDING JEJUNOSTOMY (N/A) Advance tube feeds to goal.  Increase to full liquids tonight. Work toward goal feeds, then cycle.  Hopefully home toward the end of the week.     LOS: 7 days    [facility] 04/11/2014

## 2014-04-11 NOTE — Care Management Note (Signed)
  Page 1 of 1   04/11/2014     11:44:23 AM CARE MANAGEMENT NOTE 04/11/2014  Patient:  Lynn Morris, Lynn Morris   Account Number:  [id number]  Date Initiated:  04/11/2014  Documentation initiated by:  Magdalen Spatz  Subjective/Objective Assessment:     Action/Plan:   Anticipated DC Date:     Anticipated DC Plan:  [place]         Choice offered to / List presented to:  C-1 Patient   DME arranged  TUBE FEEDING  TUBE FEEDING PUMP      DME agency  [place] arranged  HH-1 RN      [place].   Status of service:  In process, will continue to follow Medicare Important Message given?  YES (If response is "NO", the following Medicare IM given date fields will be blank) Date Medicare IM given:  04/11/2014 Medicare IM given by:  Magdalen Spatz Date Additional Medicare IM given:   Additional Medicare IM given by:    Discharge Disposition:    Per UR Regulation:    If discussed at Long Length of Stay Meetings, dates discussed:   04/11/2014    Comments:

## 2014-04-12 MED ORDER — IMPACT PEPTIDE 1.5 PO LIQD
1000.0000 mL | ORAL | Status: DC
  Administered 2014-04-13 – 2014-04-14 (×2): 1000 mL
  Filled 2014-04-12 (×7): qty 1000

## 2014-04-12 NOTE — Progress Notes (Signed)
Patient began feeds going at 65cc/hr at 2p.m. On 9/24 at 8p.m patient is to go 75 cc/hr for 16 hrs.

## 2014-04-12 NOTE — Progress Notes (Signed)
Patient ID: Lynn Morris, female   DOB: 1940-11-10, 73 y.o.   MRN: [id number] 8 Days Post-Op  Subjective: Large BM yesterday.  Having some belching.    Objective: Vital signs in last 24 hours: Temp:  [98 F (36.7 C)-98.2 F (36.8 C)] 98.2 F (36.8 C) (09/23 0539) Pulse Rate:  [73-80] 73 (09/23 0539) Resp:  [16] 16 (09/23 0539) BP: (94-103)/(58-73) 98/58 mmHg (09/23 0539) SpO2:  [95 %-98 %] 97 % (09/23 0539) Last BM Date: 04/11/14  Intake/Output from previous day: 09/22 0701 - 09/23 0700 In: 600 [P.O.:600] Out: 315 [Urine:200; Drains:115] Intake/Output this shift:    General appearance: alert, and no distress, oriented.   Resp: breathing comfortably Cardio: regular rate and rhythm GI: soft, non distended, approp tender, dressing c/d/i.  drain serosang.  Small amount of leakage around J tube.    Lab Results:  No results found for this basename: WBC, HGB, HCT, PLT,  in the last 72 hours BMET No results found for this basename: NA, K, CL, CO2, GLUCOSE, BUN, CREATININE, CALCIUM,  in the last 72 hours PT/INR No results found for this basename: LABPROT, INR,  in the last 72 hours ABG No results found for this basename: PHART, PCO2, PO2, HCO3,  in the last 72 hours  Studies/Results: No results found.  Anti-infectives: Anti-infectives   Start     Dose/Rate Route Frequency Ordered Stop   04/04/14 1600  ceFAZolin (ANCEF) IVPB 1 g/50 mL premix     1 g 100 mL/hr over 30 Minutes Intravenous Every 6 hours 04/04/14 1430 04/05/14 0421   04/04/14 0600  cefOXitin (MEFOXIN) 2 g in dextrose 5 % 50 mL IVPB     2 g 100 mL/hr over 30 Minutes Intravenous On call to O.R. 04/03/14 1421 04/04/14 0812   04/03/14 1421  cefUROXime (ZINACEF) 1.5 g in dextrose 5 % 50 mL IVPB  Status:  Discontinued     1.5 g 100 mL/hr over 30 Minutes Intravenous 60 min pre-op 04/03/14 1421 04/04/14 1425      Assessment/Plan: s/p Procedure(s): DIAGNOSTIC LAPAROSCOPY (N/A) TOTAL GASTRECTOMY  (N/A) ESOPHAGOJEJUNOSTOMY AND FEEDING JEJUNOSTOMY (N/A) Cycle J tube feeds.    Full liquids PO.   Home possibly tomorrow or Friday.    LOS: 8 days    [facility] 04/12/2014

## 2014-04-13 LAB — CBC
HCT: 30.7 % — ABNORMAL LOW (ref 36.0–46.0)
HEMOGLOBIN: 10.5 g/dL — AB (ref 12.0–15.0)
MCH: 31.4 pg (ref 26.0–34.0)
MCHC: 34.2 g/dL (ref 30.0–36.0)
MCV: 91.9 fL (ref 78.0–100.0)
Platelets: 265 10*3/uL (ref 150–400)
RBC: 3.34 MIL/uL — ABNORMAL LOW (ref 3.87–5.11)
RDW: 14.4 % (ref 11.5–15.5)
WBC: 8.4 10*3/uL (ref 4.0–10.5)

## 2014-04-13 LAB — BASIC METABOLIC PANEL
Anion gap: 11 (ref 5–15)
BUN: 35 mg/dL — ABNORMAL HIGH (ref 6–23)
CHLORIDE: 102 meq/L (ref 96–112)
CO2: 25 mEq/L (ref 19–32)
Calcium: 8.7 mg/dL (ref 8.4–10.5)
Creatinine, Ser: 0.6 mg/dL (ref 0.50–1.10)
GFR, EST NON AFRICAN AMERICAN: 90 mL/min — AB (ref >90)
Glucose, Bld: 121 mg/dL — ABNORMAL HIGH (ref 70–99)
Potassium: 4.3 mEq/L (ref 3.7–5.3)
SODIUM: 138 meq/L (ref 137–147)

## 2014-04-13 MED ORDER — OXYCODONE HCL 5 MG/5ML PO SOLN
10.0000 mg | ORAL | Status: DC | PRN
  Administered 2014-04-13 – 2014-04-14 (×3): 10 mg via ORAL
  Filled 2014-04-13 (×3): qty 10

## 2014-04-13 MED ORDER — SUCRALFATE 1 GM/10ML PO SUSP: 1.0000 g | Freq: Three times a day (TID) | ORAL | Status: DC

## 2014-04-13 MED ORDER — SUCRALFATE 1 GM/10ML PO SUSP
1.0000 g | Freq: Three times a day (TID) | ORAL | Status: DC
  Administered 2014-04-13 – 2014-04-14 (×3): 1 g via ORAL
  Filled 2014-04-13 (×7): qty 10

## 2014-04-13 MED ORDER — IMPACT PEPTIDE 1.5 PO LIQD: 1000.0000 mL | ORAL | Status: DC

## 2014-04-13 MED ORDER — OXYCODONE HCL 5 MG/5ML PO SOLN: 10.0000 mg | ORAL | Status: DC | PRN

## 2014-04-13 NOTE — Discharge Instructions (Addendum)
[facility]      [facility] °[phone] ° °ABDOMINAL SURGERY: POST OP INSTRUCTIONS ° °Always review your discharge instruction sheet given to you by the facility where your surgery was performed. ° °IF YOU HAVE DISABILITY OR FAMILY LEAVE FORMS, YOU MUST BRING THEM TO THE OFFICE FOR PROCESSING.  PLEASE DO NOT GIVE THEM TO YOUR DOCTOR. ° °1. A prescription for pain medication may be given to you upon discharge.  Take your pain medication as prescribed, if needed.  If narcotic pain medicine is not needed, then you may take acetaminophen (Tylenol) or ibuprofen (Advil) as needed. °2. Take your usually prescribed medications unless otherwise directed. °3. If you need a refill on your pain medication, please contact your pharmacy. They will contact our office to request authorization.  Prescriptions will not be filled after 5pm or on week-ends. °4. You should follow a light diet the first few days after arrival home, such as soup and crackers, pudding, etc.unless your doctor has advised otherwise. A high-fiber, low fat diet can be resumed as tolerated.   Be sure to include lots of fluids daily. Most patients will experience some swelling and bruising on the chest and neck area.  Ice packs will help.  Swelling and bruising can take several days to resolve °5. Most patients will experience some swelling and bruising in the area of the incision. Ice pack will help. Swelling and bruising can take several days to resolve..  °6. It is common to experience some constipation if taking pain medication after surgery.  Increasing fluid intake and taking a stool softener will usually help or prevent this problem from occurring.  A mild laxative (Milk of Magnesia or Miralax) should be taken according to package directions if there are no bowel movements after 48 hours. °7.  You may have steri-strips (small skin tapes) in place directly over the incision.  These strips should be left on the skin for 10-14 days.  If your  surgeon used skin glue on the incision, you may shower in 48 hours.  The glue will flake off over the next 2-3 weeks.  Any sutures or staples will be removed at the office during your follow-up visit. You may find that a light gauze bandage over your incision may keep your staples from being rubbed or pulled. You may shower and replace the bandage daily. °8. ACTIVITIES:  You may resume regular (light) daily activities beginning the next day--such as daily self-care, walking, climbing stairs--gradually increasing activities as tolerated.  You may have sexual intercourse when it is comfortable.  Refrain from any heavy lifting or straining until approved by your doctor. °a. You may drive when you no longer are taking prescription pain medication, you can comfortably wear a seatbelt, and you can safely maneuver your car and apply brakes °b. Return to Work: __________8 weeks if applicable_________________________ °9. You should see your doctor in the office for a follow-up appointment approximately two weeks after your surgery.  Make sure that you call for this appointment within a day or two after you arrive home to insure a convenient appointment time. °OTHER INSTRUCTIONS:  °_____________________________________________________________ °_____________________________________________________________ ° °WHEN TO CALL YOUR DOCTOR: °1. Fever over 101.0 °2. Inability to urinate °3. Nausea and/or vomiting °4. Extreme swelling or bruising °5. Continued bleeding from incision. °6. Increased pain, redness, or drainage from the incision. °7. Difficulty swallowing or breathing °8. Muscle cramping or spasms. °9. Numbness or tingling in hands or feet or around lips. ° °The clinic staff is   available to answer your questions during regular business hours.  Please don’t hesitate to call and ask to speak to one of the nurses if you have concerns. ° °For further questions, please visit [email or web address] ° ° ° °

## 2014-04-13 NOTE — Progress Notes (Signed)
Patient ID: Lynn Morris, female   DOB: 1941/03/17, 73 y.o.   MRN: [id number] 9 Days Post-Op   Subjective: Pt is still having reflux.  Pain is OK.     Objective: Vital signs in last 24 hours: Temp:  [97.8 F (36.6 C)-98.1 F (36.7 C)] 97.8 F (36.6 C) (09/24 1334) Pulse Rate:  [78-95] 78 (09/24 1334) Resp:  [15-17] 15 (09/24 1334) BP: (90-100)/(50-58) 95/56 mmHg (09/24 1334) SpO2:  [95 %-97 %] 97 % (09/24 1334) Last BM Date: 04/05/14  Intake/Output from previous day: 09/23 0701 - 09/24 0700 In: 1320 [P.O.:1320] Out: 90 [Drains:90] Intake/Output this shift:    General appearance: alert, and no distress, oriented.   Resp: breathing comfortably Cardio: regular rate and rhythm GI: soft, non distended, approp tender, dressing c/d/i.  drain serosang.  No erythema  Lab Results:   Recent Labs  04/13/14 0800  WBC 8.4  HGB 10.5*  HCT 30.7*  PLT 265   BMET  Recent Labs  04/13/14 0800  NA 138  K 4.3  CL 102  CO2 25  GLUCOSE 121*  BUN 35*  CREATININE 0.60  CALCIUM 8.7   PT/INR No results found for this basename: LABPROT, INR,  in the last 72 hours ABG No results found for this basename: PHART, PCO2, PO2, HCO3,  in the last 72 hours  Studies/Results: No results found.  Anti-infectives: Anti-infectives   Start     Dose/Rate Route Frequency Ordered Stop   04/04/14 1600  ceFAZolin (ANCEF) IVPB 1 g/50 mL premix     1 g 100 mL/hr over 30 Minutes Intravenous Every 6 hours 04/04/14 1430 04/05/14 0421   04/04/14 0600  cefOXitin (MEFOXIN) 2 g in dextrose 5 % 50 mL IVPB     2 g 100 mL/hr over 30 Minutes Intravenous On call to O.R. 04/03/14 1421 04/04/14 0812   04/03/14 1421  cefUROXime (ZINACEF) 1.5 g in dextrose 5 % 50 mL IVPB  Status:  Discontinued     1.5 g 100 mL/hr over 30 Minutes Intravenous 60 min pre-op 04/03/14 1421 04/04/14 1425      Assessment/Plan: s/p Procedure(s): DIAGNOSTIC LAPAROSCOPY (N/A) TOTAL GASTRECTOMY (N/A) ESOPHAGOJEJUNOSTOMY AND FEEDING  JEJUNOSTOMY (N/A) Cycle J tube feeds.    Full liquids PO.   Home tomorrow. Add carafate and oral pain meds.      LOS: 9 days    Lynn Morris 04/13/2014

## 2014-04-14 MED ORDER — PHENOL 1.4 % MT LIQD
1.0000 | OROMUCOSAL | Status: DC | PRN
  Administered 2014-04-14: 1 via OROMUCOSAL
  Filled 2014-04-14: qty 177

## 2014-04-14 NOTE — Progress Notes (Signed)
Discharge home. Home discharge instruction given to patient. Home health needs addressed by case manager, and [facility] will be in visiting patient today . J tube intact, dressing change to site.

## 2014-04-14 NOTE — Discharge Summary (Signed)
Physician Discharge Summary  Patient ID: Lynn Morris MRN: [id number] DOB/AGE: 1941-01-28 73 y.o.  Admit date: 04/04/2014 Discharge date: 04/14/2014  Admission Diagnoses: Gastric cancer  Discharge Diagnoses:  Active Problems:   Gastric cancer  Discharged Condition: stable  Hospital Course:  The patient was admitted to the ICU following a total gastrectomy and esophagojejunostomy with an intestinal pouch and a J tube.  The patient had issues with urine output overnight.  She got some blood on POD 1 for anemia and oliguria and this improved.  The patient also had some hypotension for several days.  She was able to transfer to the floor on POD 4.  She was started on tube feeds on POD 2.  These were slowly advanced.  She was able to have a bowel movement and her tube feeds were advanced to goal.  The patient underwent a swallow study and was not shown to have a leak.  She was able to cycle some on her feeds as well.  She was able to ambulate independently and void independently.  She was discharged to home in stable condition with around 60-70% of her caloric needs.    Consults: thoracic and nutrition  Significant Diagnostic Studies: labs: see epic  Treatments: IV hydration, surgery: see above and tube feeds.    Discharge Exam: Blood pressure 104/57, pulse 84, temperature 97.4 F (36.3 C), temperature source Oral, resp. rate 20, height 5\' 1"  (1.549 m), weight 123 lb 11.2 oz (56.11 kg), SpO2 97.00%. General appearance: alert, cooperative and no distress Resp: breathing comfortably GI: soft, non distended, approp tender  Disposition: 01-Home or Self Care     Medication List         *STUDY* feeding supplement (IMPACT PEPTIDE 1.5) Liqd  Place 1,000 mLs into feeding tube continuous. 65 mL/hr for 14 hours.     CALCIUM 1200 PO  Take 2 tablets by mouth daily.     ibuprofen 200 MG tablet  Commonly known as:  ADVIL,MOTRIN  Take 200 mg by mouth every 6 (six) hours as needed for moderate  pain.     multivitamin with minerals Tabs tablet  Take 1 tablet by mouth daily.     oxyCODONE 5 MG/5ML solution  Commonly known as:  ROXICODONE  Take 10 mLs (10 mg total) by mouth every 4 (four) hours as needed (pain).     sertraline 100 MG tablet  Commonly known as:  ZOLOFT  Take 100 mg by mouth every other day.     sucralfate 1 GM/10ML suspension  Commonly known as:  CARAFATE  Take 10 mLs (1 g total) by mouth 4 (four) times daily -  with meals and at bedtime.     vitamin B-12 500 MCG tablet  Commonly known as:  CYANOCOBALAMIN  Take 500 mcg by mouth daily.     Vitamin D3 2000 UNITS Tabs  Take 1 tablet by mouth.           Follow-up Information   Follow up with [facility], [place] In 2 weeks.   Specialty:  General Surgery   Contact information:   [address] [place] [address] [phone]       Signed: Stark Klein 04/14/2014, 6:33 AM

## 2014-04-14 NOTE — Progress Notes (Addendum)
Nutrition Brief Note  Pt advanced to Full Liquid diet 9/22.  Current PO intake variable at 50-100%.  Pt currently receiving Impact Peptide 1.5 formula during hospital for [facility] Pilot Study.  Pt was being followed by outpatient [facility] (Havana, RD, Wright, LDN) prior to admission.  This RD spoke with Barb this AM.  Pt no longer requires specialized formula for quality improvement trial. Recommend home TF regimen of Osmolite 1.5 formula at 75 ml/hr x 16 hours via J-tube to provide 1800 kcals, 75 gm protein, 914 ml of free water.  Arthur Holms, RD, LDN Pager #: [phone] After-Hours Pager #: [phone]

## 2014-04-19 ENCOUNTER — Telehealth: Payer: Self-pay | Admitting: *Deleted

## 2014-04-19 NOTE — Telephone Encounter (Signed)
Call from pt requesting to schedule a follow up appt. Feels much better since having feeding tube removed. Pt reports she is tolerating a full liquid diet. Requests appt 10/9 if possible as she is seeing Dr. Barry Dienes that day at 1130. Reviewed with Dr. Benay Spice: Appt given for 10/9.

## 2014-04-28 ENCOUNTER — Ambulatory Visit (HOSPITAL_BASED_OUTPATIENT_CLINIC_OR_DEPARTMENT_OTHER): Payer: Medicare Other | Admitting: Oncology

## 2014-04-28 ENCOUNTER — Telehealth: Payer: Self-pay | Admitting: *Deleted

## 2014-04-28 ENCOUNTER — Ambulatory Visit (HOSPITAL_BASED_OUTPATIENT_CLINIC_OR_DEPARTMENT_OTHER): Payer: Medicare Other

## 2014-04-28 ENCOUNTER — Telehealth: Payer: Self-pay | Admitting: Oncology

## 2014-04-28 VITALS — BP 109/70 | HR 75 | Temp 97.4°F | Resp 18 | Ht 61.0 in | Wt 109.1 lb

## 2014-04-28 DIAGNOSIS — G620 Drug-induced polyneuropathy: Secondary | ICD-10-CM

## 2014-04-28 DIAGNOSIS — C169 Malignant neoplasm of stomach, unspecified: Secondary | ICD-10-CM

## 2014-04-28 MED ORDER — SODIUM CHLORIDE 0.9 % IJ SOLN
10.0000 mL | INTRAMUSCULAR | Status: DC | PRN
  Administered 2014-04-28: 10 mL via INTRAVENOUS
  Filled 2014-04-28: qty 10

## 2014-04-28 MED ORDER — HEPARIN SOD (PORK) LOCK FLUSH 100 UNIT/ML IV SOLN
500.0000 [IU] | Freq: Once | INTRAVENOUS | Status: AC
  Administered 2014-04-28: 500 [IU] via INTRAVENOUS
  Filled 2014-04-28: qty 5

## 2014-04-28 NOTE — Patient Instructions (Signed)

## 2014-04-28 NOTE — Telephone Encounter (Signed)
gave avs & cal email MW to add tx

## 2014-04-28 NOTE — Progress Notes (Signed)
[place] OFFICE PROGRESS NOTE   Diagnosis: Gastric cancer  INTERVAL HISTORY:   [name] returns as scheduled. She underwent a diagnostic laparoscopy, total gastrectomy, and a Roux-en-Y esophagojejunostomy by Drs. Patsy Lager on 04/04/2014. There was no evidence of carcinomatosis. The GE junction, lesser curvature, and greater curvature appeared involved with tumor.  The pathology ([id number]) confirmed invasive poorly differentiated adenocarcinoma. Adenocarcinoma extended into the subserosal connective tissue and close to the peritoneal surface. Perineural invasion was identified. There were 4 soft tissue tumor deposits characterized as positive lymph nodes. The final margins were negative for adenocarcinoma. A moderate treatment effect was present. The tumor was staged as a ypT3,ypN2 lesion.  [name] had an uneventful operative recovery. She denies nausea and pain. Her bowels are functioning. She remains on a full liquid diet. She is using nutrition supplements. [name] is scheduled to see Dr. Barry Dienes later today.  Objective:  Vital signs in last 24 hours:  Blood pressure 109/70, pulse 75, temperature 97.4 F (36.3 C), temperature source Oral, resp. rate 18, height 5\' 1"  (1.549 m), weight 109 lb 1.6 oz (49.487 kg), SpO2 100.00%.    HEENT: Neck without mass Lymphatics: No cervical or supraclavicular nodes Resp: Lungs clear bilaterally Cardio: Regular rate and rhythm GI: No hepatosplenomegaly, healed surgical incision, nontender Vascular: No leg edema  Portacath/PICC-without erythema  Imaging:  No results found.  Medications: I have reviewed the patient's current medications.  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  Cycle 1 CAPOX beginning 01/02/2014.  Cycle 2 CAPOX beginning 01/23/2014.  Cycle 3 CAPOX beginning 02/13/2014.  Restaging PET scan 02/27/2014 with mild hypermetabolism in the hilar regions with an SUV max on the right of 4.9. Previously seen mild hypermetabolism associated with the distal esophagus not readily identified. Mild uptake in the stomach without a focal lesion or uptake above blood pool. Total gastrectomy with a Roux-en-Y esophagojejunostomy 04/04/2014 confirmed a ypT3,ypN2 tumor with negative surgical margins 2. History of gastroesophageal reflux disease and peptic stricture, status post an esophageal dilatation procedure in August 2012 3. History of solid dysphagia secondary to #1.  4. Anxiety. 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   Disposition:  Lynn Morris is recovering from the gastrectomy procedure. She appears to be doing very well. We discussed the surgical pathology report and adjuvant treatment options. She remains in increase risk of developing recurrent disease. Her case was presented at the GI tumor conference. The consensus recommendation is to proceed with adjuvant chemotherapy. I discussed her case with radiation oncology and radiation is not recommended.  [name] completed 3 cycles of neoadjuvant CAPOX. Her clinical status improved and there  was a treatment effect noted on the gastrectomy tumor specimen. I recommend completing 3 months of adjuvant 5-FU/oxaliplatin-based therapy. She  feels it would be difficult to take the capecitabine pills. We decided to treat her with adjuvant FOLFOX. She will begin a first cycle of FOLFOX 05/08/2014. We will see her for an office visit that day.  Betsy Coder, MD  04/28/2014  9:30 AM

## 2014-04-28 NOTE — Telephone Encounter (Signed)
Per staff message and POF I have scheduled appts. Advised scheduler of appts. JMW  

## 2014-05-05 ENCOUNTER — Other Ambulatory Visit: Payer: Self-pay | Admitting: *Deleted

## 2014-05-05 MED ORDER — LIDOCAINE-PRILOCAINE 2.5-2.5 % EX CREA: TOPICAL_CREAM | CUTANEOUS | Status: DC

## 2014-05-05 NOTE — Telephone Encounter (Signed)
Notified patient that EMLA cream was refilled.

## 2014-05-07 ENCOUNTER — Other Ambulatory Visit: Payer: Self-pay | Admitting: Oncology

## 2014-05-08 ENCOUNTER — Other Ambulatory Visit: Payer: Self-pay

## 2014-05-08 ENCOUNTER — Ambulatory Visit (HOSPITAL_BASED_OUTPATIENT_CLINIC_OR_DEPARTMENT_OTHER): Payer: Medicare Other | Admitting: Nurse Practitioner

## 2014-05-08 ENCOUNTER — Ambulatory Visit (HOSPITAL_BASED_OUTPATIENT_CLINIC_OR_DEPARTMENT_OTHER): Payer: Medicare Other

## 2014-05-08 ENCOUNTER — Ambulatory Visit: Payer: Medicare Other | Admitting: Nutrition

## 2014-05-08 ENCOUNTER — Other Ambulatory Visit (HOSPITAL_BASED_OUTPATIENT_CLINIC_OR_DEPARTMENT_OTHER): Payer: Medicare Other

## 2014-05-08 ENCOUNTER — Telehealth: Payer: Self-pay | Admitting: Oncology

## 2014-05-08 VITALS — BP 109/67 | HR 74 | Temp 97.7°F | Resp 18 | Ht 61.0 in | Wt 106.7 lb

## 2014-05-08 DIAGNOSIS — R110 Nausea: Secondary | ICD-10-CM

## 2014-05-08 DIAGNOSIS — C169 Malignant neoplasm of stomach, unspecified: Secondary | ICD-10-CM

## 2014-05-08 DIAGNOSIS — Z5111 Encounter for antineoplastic chemotherapy: Secondary | ICD-10-CM

## 2014-05-08 LAB — COMPREHENSIVE METABOLIC PANEL (CC13)
ALBUMIN: 3 g/dL — AB (ref 3.5–5.0)
ALK PHOS: 44 U/L (ref 40–150)
ALT: 15 U/L (ref 0–55)
AST: 17 U/L (ref 5–34)
Anion Gap: 7 mEq/L (ref 3–11)
BUN: 17.4 mg/dL (ref 7.0–26.0)
CALCIUM: 9.5 mg/dL (ref 8.4–10.4)
CHLORIDE: 109 meq/L (ref 98–109)
CO2: 26 mEq/L (ref 22–29)
Creatinine: 0.8 mg/dL (ref 0.6–1.1)
Glucose: 103 mg/dl (ref 70–140)
Potassium: 3.8 mEq/L (ref 3.5–5.1)
SODIUM: 141 meq/L (ref 136–145)
Total Bilirubin: 0.51 mg/dL (ref 0.20–1.20)
Total Protein: 6.1 g/dL — ABNORMAL LOW (ref 6.4–8.3)

## 2014-05-08 LAB — CBC WITH DIFFERENTIAL/PLATELET
BASO%: 0.6 % (ref 0.0–2.0)
BASOS ABS: 0 10*3/uL (ref 0.0–0.1)
EOS%: 1.2 % (ref 0.0–7.0)
Eosinophils Absolute: 0.1 10*3/uL (ref 0.0–0.5)
HCT: 36.8 % (ref 34.8–46.6)
HEMOGLOBIN: 11.8 g/dL (ref 11.6–15.9)
LYMPH#: 1.3 10*3/uL (ref 0.9–3.3)
LYMPH%: 27.4 % (ref 14.0–49.7)
MCH: 28.7 pg (ref 25.1–34.0)
MCHC: 32.1 g/dL (ref 31.5–36.0)
MCV: 89.5 fL (ref 79.5–101.0)
MONO#: 0.6 10*3/uL (ref 0.1–0.9)
MONO%: 11.6 % (ref 0.0–14.0)
NEUT#: 2.9 10*3/uL (ref 1.5–6.5)
NEUT%: 59.2 % (ref 38.4–76.8)
Platelets: 226 10*3/uL (ref 145–400)
RBC: 4.11 10*6/uL (ref 3.70–5.45)
RDW: 15.3 % — AB (ref 11.2–14.5)
WBC: 4.9 10*3/uL (ref 3.9–10.3)

## 2014-05-08 MED ORDER — DEXAMETHASONE SODIUM PHOSPHATE 10 MG/ML IJ SOLN
INTRAMUSCULAR | Status: AC
  Filled 2014-05-08: qty 1

## 2014-05-08 MED ORDER — LEUCOVORIN CALCIUM INJECTION 350 MG
400.0000 mg/m2 | Freq: Once | INTRAVENOUS | Status: AC
  Administered 2014-05-08: 584 mg via INTRAVENOUS
  Filled 2014-05-08: qty 29.2

## 2014-05-08 MED ORDER — FLUOROURACIL CHEMO INJECTION 2.5 GM/50ML
400.0000 mg/m2 | Freq: Once | INTRAVENOUS | Status: AC
  Administered 2014-05-08: 600 mg via INTRAVENOUS
  Filled 2014-05-08: qty 12

## 2014-05-08 MED ORDER — ONDANSETRON 8 MG/NS 50 ML IVPB
INTRAVENOUS | Status: AC
  Filled 2014-05-08: qty 8

## 2014-05-08 MED ORDER — DEXAMETHASONE SODIUM PHOSPHATE 10 MG/ML IJ SOLN
10.0000 mg | Freq: Once | INTRAMUSCULAR | Status: AC
  Administered 2014-05-08: 10 mg via INTRAVENOUS

## 2014-05-08 MED ORDER — MIRTAZAPINE 15 MG PO TABS: 15.0000 mg | ORAL_TABLET | Freq: Every day | ORAL | Status: DC

## 2014-05-08 MED ORDER — DEXTROSE 5 % IV SOLN
Freq: Once | INTRAVENOUS | Status: AC
  Administered 2014-05-08: 11:00:00 via INTRAVENOUS

## 2014-05-08 MED ORDER — SODIUM CHLORIDE 0.9 % IV SOLN
2400.0000 mg/m2 | INTRAVENOUS | Status: DC
  Administered 2014-05-08: 3500 mg via INTRAVENOUS
  Filled 2014-05-08: qty 70

## 2014-05-08 MED ORDER — OXALIPLATIN CHEMO INJECTION 100 MG/20ML
85.0000 mg/m2 | Freq: Once | INTRAVENOUS | Status: AC
  Administered 2014-05-08: 125 mg via INTRAVENOUS
  Filled 2014-05-08: qty 25

## 2014-05-08 MED ORDER — ONDANSETRON 8 MG/50ML IVPB (CHCC)
8.0000 mg | Freq: Once | INTRAVENOUS | Status: AC
  Administered 2014-05-08: 8 mg via INTRAVENOUS

## 2014-05-08 MED ORDER — ONDANSETRON HCL 8 MG PO TABS: 8.0000 mg | ORAL_TABLET | Freq: Three times a day (TID) | ORAL | Status: DC | PRN

## 2014-05-08 NOTE — Telephone Encounter (Signed)
gv and printed appt sched and avs for pt for OCT and NOV....sed added tx. °

## 2014-05-08 NOTE — Patient Instructions (Addendum)
[facility] Discharge Instructions for Patients Receiving Chemotherapy  Today you received the following chemotherapy agents: Oxaliplatin, Leucovorin, and 5FU  To help prevent nausea and vomiting after your treatment, we encourage you to take your nausea medication: Zofran and Compazine 10mg  every 6 hours as needed.   If you develop nausea and vomiting that is not controlled by your nausea medication, call the clinic.   BELOW ARE SYMPTOMS THAT SHOULD BE REPORTED IMMEDIATELY:  *FEVER GREATER THAN 100.5 F  *CHILLS WITH OR WITHOUT FEVER  NAUSEA AND VOMITING THAT IS NOT CONTROLLED WITH YOUR NAUSEA MEDICATION  *UNUSUAL SHORTNESS OF BREATH  *UNUSUAL BRUISING OR BLEEDING  TENDERNESS IN MOUTH AND THROAT WITH OR WITHOUT PRESENCE OF ULCERS  *URINARY PROBLEMS  *BOWEL PROBLEMS  UNUSUAL RASH Items with * indicate a potential emergency and should be followed up as soon as possible.  Feel free to call the clinic you have any questions or concerns. The clinic phone number is [phone].   Leucovorin injection What is this medicine? LEUCOVORIN (loo koe VOR in) is used to prevent or treat the harmful effects of some medicines. This medicine is used to treat anemia caused by a low amount of folic acid in the body. It is also used with 5-fluorouracil (5-FU) to treat colon cancer. This medicine may be used for other purposes; ask your health care provider or pharmacist if you have questions. What should I tell my health care provider before I take this medicine? They need to know if you have any of these conditions: -anemia from low levels of vitamin B-12 in the blood -an unusual or allergic reaction to leucovorin, folic acid, other medicines, foods, dyes, or preservatives -pregnant or trying to get pregnant -breast-feeding How should I use this medicine? This medicine is for injection into a muscle or into a vein. It is given by a health care professional in a hospital or  clinic setting. Talk to your pediatrician regarding the use of this medicine in children. Special care may be needed. Overdosage: If you think you have taken too much of this medicine contact a poison control center or emergency room at once. NOTE: This medicine is only for you. Do not share this medicine with others. What if I miss a dose? This does not apply. What may interact with this medicine? -capecitabine -fluorouracil -phenobarbital -phenytoin -primidone -trimethoprim-sulfamethoxazole This list may not describe all possible interactions. Give your health care provider a list of all the medicines, herbs, non-prescription drugs, or dietary supplements you use. Also tell them if you smoke, drink alcohol, or use illegal drugs. Some items may interact with your medicine. What should I watch for while using this medicine? Your condition will be monitored carefully while you are receiving this medicine. This medicine may increase the side effects of 5-fluorouracil, 5-FU. Tell your doctor or health care professional if you have diarrhea or mouth sores that do not get better or that get worse. What side effects may I notice from receiving this medicine? Side effects that you should report to your doctor or health care professional as soon as possible: -allergic reactions like skin rash, itching or hives, swelling of the face, lips, or tongue -breathing problems -fever, infection -mouth sores -unusual bleeding or bruising -unusually weak or tired Side effects that usually do not require medical attention (report to your doctor or health care professional if they continue or are bothersome): -constipation or diarrhea -loss of appetite -nausea, vomiting This list may not describe all possible  side effects. Call your doctor for medical advice about side effects. You may report side effects to FDA at 1-800-FDA-1088. Where should I keep my medicine? This drug is given in a hospital or clinic  and will not be stored at home. NOTE: This sheet is a summary. It may not cover all possible information. If you have questions about this medicine, talk to your doctor, pharmacist, or health care provider.  2015, Elsevier/Gold Standard. (2008-01-11 16:50:29)    Fluorouracil, 5-FU injection What is this medicine? FLUOROURACIL, 5-FU (flure oh YOOR a sil) is a chemotherapy drug. It slows the growth of cancer cells. This medicine is used to treat many types of cancer like breast cancer, colon or rectal cancer, pancreatic cancer, and stomach cancer. This medicine may be used for other purposes; ask your health care provider or pharmacist if you have questions. COMMON BRAND NAME(S): Adrucil What should I tell my health care provider before I take this medicine? They need to know if you have any of these conditions: -blood disorders -dihydropyrimidine dehydrogenase (DPD) deficiency -infection (especially a virus infection such as chickenpox, cold sores, or herpes) -kidney disease -liver disease -malnourished, poor nutrition -recent or ongoing radiation therapy -an unusual or allergic reaction to fluorouracil, other chemotherapy, other medicines, foods, dyes, or preservatives -pregnant or trying to get pregnant -breast-feeding How should I use this medicine? This drug is given as an infusion or injection into a vein. It is administered in a hospital or clinic by a specially trained health care professional. Talk to your pediatrician regarding the use of this medicine in children. Special care may be needed. Overdosage: If you think you have taken too much of this medicine contact a poison control center or emergency room at once. NOTE: This medicine is only for you. Do not share this medicine with others. What if I miss a dose? It is important not to miss your dose. Call your doctor or health care professional if you are unable to keep an appointment. What may interact with this  medicine? -allopurinol -cimetidine -dapsone -digoxin -hydroxyurea -leucovorin -levamisole -medicines for seizures like ethotoin, fosphenytoin, phenytoin -medicines to increase blood counts like filgrastim, pegfilgrastim, sargramostim -medicines that treat or prevent blood clots like warfarin, enoxaparin, and dalteparin -methotrexate -metronidazole -pyrimethamine -some other chemotherapy drugs like busulfan, cisplatin, estramustine, vinblastine -trimethoprim -trimetrexate -vaccines Talk to your doctor or health care professional before taking any of these medicines: -acetaminophen -aspirin -ibuprofen -ketoprofen -naproxen This list may not describe all possible interactions. Give your health care provider a list of all the medicines, herbs, non-prescription drugs, or dietary supplements you use. Also tell them if you smoke, drink alcohol, or use illegal drugs. Some items may interact with your medicine. What should I watch for while using this medicine? Visit your doctor for checks on your progress. This drug may make you feel generally unwell. This is not uncommon, as chemotherapy can affect healthy cells as well as cancer cells. Report any side effects. Continue your course of treatment even though you feel ill unless your doctor tells you to stop. In some cases, you may be given additional medicines to help with side effects. Follow all directions for their use. Call your doctor or health care professional for advice if you get a fever, chills or sore throat, or other symptoms of a cold or flu. Do not treat yourself. This drug decreases your body's ability to fight infections. Try to avoid being around people who are sick. This medicine may increase your risk to  bruise or bleed. Call your doctor or health care professional if you notice any unusual bleeding. Be careful brushing and flossing your teeth or using a toothpick because you may get an infection or bleed more easily. If you  have any dental work done, tell your dentist you are receiving this medicine. Avoid taking products that contain aspirin, acetaminophen, ibuprofen, naproxen, or ketoprofen unless instructed by your doctor. These medicines may hide a fever. Do not become pregnant while taking this medicine. Women should inform their doctor if they wish to become pregnant or think they might be pregnant. There is a potential for serious side effects to an unborn child. Talk to your health care professional or pharmacist for more information. Do not breast-feed an infant while taking this medicine. Men should inform their doctor if they wish to father a child. This medicine may lower sperm counts. Do not treat diarrhea with over the counter products. Contact your doctor if you have diarrhea that lasts more than 2 days or if it is severe and watery. This medicine can make you more sensitive to the sun. Keep out of the sun. If you cannot avoid being in the sun, wear protective clothing and use sunscreen. Do not use sun lamps or tanning beds/booths. What side effects may I notice from receiving this medicine? Side effects that you should report to your doctor or health care professional as soon as possible: -allergic reactions like skin rash, itching or hives, swelling of the face, lips, or tongue -low blood counts - this medicine may decrease the number of white blood cells, red blood cells and platelets. You may be at increased risk for infections and bleeding. -signs of infection - fever or chills, cough, sore throat, pain or difficulty passing urine -signs of decreased platelets or bleeding - bruising, pinpoint red spots on the skin, black, tarry stools, blood in the urine -signs of decreased red blood cells - unusually weak or tired, fainting spells, lightheadedness -breathing problems -changes in vision -chest pain -mouth sores -nausea and vomiting -pain, swelling, redness at site where injected -pain, tingling,  numbness in the hands or feet -redness, swelling, or sores on hands or feet -stomach pain -unusual bleeding Side effects that usually do not require medical attention (report to your doctor or health care professional if they continue or are bothersome): -changes in finger or toe nails -diarrhea -dry or itchy skin -hair loss -headache -loss of appetite -sensitivity of eyes to the light -stomach upset -unusually teary eyes This list may not describe all possible side effects. Call your doctor for medical advice about side effects. You may report side effects to FDA at 1-800-FDA-1088. Where should I keep my medicine? This drug is given in a hospital or clinic and will not be stored at home. NOTE: This sheet is a summary. It may not cover all possible information. If you have questions about this medicine, talk to your doctor, pharmacist, or health care provider.  2015, Elsevier/Gold Standard. (2007-11-10 13:53:16)    Oxaliplatin Injection What is this medicine? OXALIPLATIN (ox AL i PLA tin) is a chemotherapy drug. It targets fast dividing cells, like cancer cells, and causes these cells to die. This medicine is used to treat cancers of the colon and rectum, and many other cancers. This medicine may be used for other purposes; ask your health care provider or pharmacist if you have questions. COMMON BRAND NAME(S): Eloxatin What should I tell my health care provider before I take this medicine? They need to  know if you have any of these conditions: -kidney disease -an unusual or allergic reaction to oxaliplatin, other chemotherapy, other medicines, foods, dyes, or preservatives -pregnant or trying to get pregnant -breast-feeding How should I use this medicine? This drug is given as an infusion into a vein. It is administered in a hospital or clinic by a specially trained health care professional. Talk to your pediatrician regarding the use of this medicine in children. Special care  may be needed. Overdosage: If you think you have taken too much of this medicine contact a poison control center or emergency room at once. NOTE: This medicine is only for you. Do not share this medicine with others. What if I miss a dose? It is important not to miss a dose. Call your doctor or health care professional if you are unable to keep an appointment. What may interact with this medicine? -medicines to increase blood counts like filgrastim, pegfilgrastim, sargramostim -probenecid -some antibiotics like amikacin, gentamicin, neomycin, polymyxin B, streptomycin, tobramycin -zalcitabine Talk to your doctor or health care professional before taking any of these medicines: -acetaminophen -aspirin -ibuprofen -ketoprofen -naproxen This list may not describe all possible interactions. Give your health care provider a list of all the medicines, herbs, non-prescription drugs, or dietary supplements you use. Also tell them if you smoke, drink alcohol, or use illegal drugs. Some items may interact with your medicine. What should I watch for while using this medicine? Your condition will be monitored carefully while you are receiving this medicine. You will need important blood work done while you are taking this medicine. This medicine can make you more sensitive to cold. Do not drink cold drinks or use ice. Cover exposed skin before coming in contact with cold temperatures or cold objects. When out in cold weather wear warm clothing and cover your mouth and nose to warm the air that goes into your lungs. Tell your doctor if you get sensitive to the cold. This drug may make you feel generally unwell. This is not uncommon, as chemotherapy can affect healthy cells as well as cancer cells. Report any side effects. Continue your course of treatment even though you feel ill unless your doctor tells you to stop. In some cases, you may be given additional medicines to help with side effects. Follow all  directions for their use. Call your doctor or health care professional for advice if you get a fever, chills or sore throat, or other symptoms of a cold or flu. Do not treat yourself. This drug decreases your body's ability to fight infections. Try to avoid being around people who are sick. This medicine may increase your risk to bruise or bleed. Call your doctor or health care professional if you notice any unusual bleeding. Be careful brushing and flossing your teeth or using a toothpick because you may get an infection or bleed more easily. If you have any dental work done, tell your dentist you are receiving this medicine. Avoid taking products that contain aspirin, acetaminophen, ibuprofen, naproxen, or ketoprofen unless instructed by your doctor. These medicines may hide a fever. Do not become pregnant while taking this medicine. Women should inform their doctor if they wish to become pregnant or think they might be pregnant. There is a potential for serious side effects to an unborn child. Talk to your health care professional or pharmacist for more information. Do not breast-feed an infant while taking this medicine. Call your doctor or health care professional if you get diarrhea. Do not treat  yourself. What side effects may I notice from receiving this medicine? Side effects that you should report to your doctor or health care professional as soon as possible: -allergic reactions like skin rash, itching or hives, swelling of the face, lips, or tongue -low blood counts - This drug may decrease the number of white blood cells, red blood cells and platelets. You may be at increased risk for infections and bleeding. -signs of infection - fever or chills, cough, sore throat, pain or difficulty passing urine -signs of decreased platelets or bleeding - bruising, pinpoint red spots on the skin, black, tarry stools, nosebleeds -signs of decreased red blood cells - unusually weak or tired, fainting  spells, lightheadedness -breathing problems -chest pain, pressure -cough -diarrhea -jaw tightness -mouth sores -nausea and vomiting -pain, swelling, redness or irritation at the injection site -pain, tingling, numbness in the hands or feet -problems with balance, talking, walking -redness, blistering, peeling or loosening of the skin, including inside the mouth -trouble passing urine or change in the amount of urine Side effects that usually do not require medical attention (report to your doctor or health care professional if they continue or are bothersome): -changes in vision -constipation -hair loss -loss of appetite -metallic taste in the mouth or changes in taste -stomach pain This list may not describe all possible side effects. Call your doctor for medical advice about side effects. You may report side effects to FDA at 1-800-FDA-1088. Where should I keep my medicine? This drug is given in a hospital or clinic and will not be stored at home. NOTE: This sheet is a summary. It may not cover all possible information. If you have questions about this medicine, talk to your doctor, pharmacist, or health care provider.  2015, Elsevier/Gold Standard. (2008-02-01 17:22:47)

## 2014-05-08 NOTE — Progress Notes (Signed)
[facility] OFFICE PROGRESS NOTE   Diagnosis:  Gastric cancer  INTERVAL HISTORY:   [name] returns as scheduled. She has had a few episodes of mild nausea. No vomiting. Appetite continues to be poor. She has lost a few pounds. No constipation or diarrhea. No numbness or tingling in her hands or feet. She denies abdominal pain. She is intermittently having difficulty sleeping.  Objective:  Vital signs in last 24 hours:  Blood pressure 109/67, pulse 74, temperature 97.7 F (36.5 C), temperature source Oral, resp. rate 18, height 5\' 1"  (1.549 m), weight 106 lb 11.2 oz (48.399 kg).    HEENT: No thrush or ulcers. Resp: Lungs clear bilaterally. Cardio: Regular rate and rhythm. GI: Abdomen soft and nontender. No hepatomegaly. Well-healed midline incision. Vascular: No leg edema. Calves soft and nontender. Port-A-Cath without erythema.  Lab Results:  Lab Results  Component Value Date   WBC 4.9 05/08/2014   HGB 11.8 05/08/2014   HCT 36.8 05/08/2014   MCV 89.5 05/08/2014   PLT 226 05/08/2014   NEUTROABS 2.9 05/08/2014    Imaging:  No results found.  Medications: I have reviewed the patient's current medications.  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  Cycle 1 CAPOX beginning 01/02/2014.  Cycle 2 CAPOX beginning 01/23/2014.  Cycle 3 CAPOX beginning 02/13/2014.  Restaging PET scan 02/27/2014 with mild hypermetabolism in the hilar regions with an SUV max on the right of 4.9. Previously seen mild hypermetabolism associated with the distal esophagus not readily identified. Mild uptake in the stomach without a focal lesion or uptake above blood pool.  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 2. History of gastroesophageal reflux disease and peptic stricture, status post an esophageal dilatation procedure in August 2012 3. History of solid dysphagia secondary to #1.  4. Anxiety. 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 6. Anorexia. Trial of Remeron initiated 05/08/2014.   Disposition: [name] appears stable. Plan to proceed with cycle 1 of 6 of adjuvant FOLFOX today as scheduled. She has a vacation planned in the next few weeks. Cycle 2 FOLFOX will be given at a 3 week interval.  She is experiencing anorexia and would like an appetite stimulant. She has tried Megace in the past and would prefer something different. She is also having difficulty sleeping. She will begin Remeron 15 mg at bedtime.  She will return for a followup visit and cycle 2 FOLFOX on 05/29/2014. She will contact the office in the interim with any problems.  Plan reviewed with Dr. Benay Spice. 25 minutes were spent face-to-face at today's visit with the majority of the time involved in counseling/coordination of care.    Ned Card ANP/GNP-BC   05/08/2014  10:31 AM

## 2014-05-08 NOTE — Progress Notes (Signed)
Nutrition followup completed with patient during chemotherapy.  Patient is status post esophagojejunostomy and total gastrectomy.  Patient's feeding tube has been pulled and patient has advanced to low fiber diet last week.  She is only able to consume very small amounts at one time.  She is chewing her food thoroughly and trying to follow with liquids.  Patient has had 14% weight loss over past month primarily due to liquid diet.  Nutrition diagnosis: Unintended weight loss continues.  Intervention: Patient educated to consume small, frequent snacks throughout the day. Recommended high-calorie, high-protein foods. Encouraged patient to consume oral nutrition supplements 3 times a day minimum. Provided patient with some high calorie supplement samples.  Also provided patient with complementary case of Ensure Plus. Questions were answered.  Teach back method used.  Monitoring, evaluation, goals: Patient will tolerate increased calories and protein along with oral nutrition supplements to promote weight gain.  Next visit: Monday, November 9, during chemotherapy.  **Disclaimer: This note was dictated with voice recognition software. Similar sounding words can inadvertently be transcribed and this note may contain transcription errors which may not have been corrected upon publication of note.**

## 2014-05-10 ENCOUNTER — Ambulatory Visit (HOSPITAL_BASED_OUTPATIENT_CLINIC_OR_DEPARTMENT_OTHER): Payer: Medicare Other

## 2014-05-10 VITALS — BP 111/76 | HR 77 | Temp 97.5°F

## 2014-05-10 DIAGNOSIS — C169 Malignant neoplasm of stomach, unspecified: Secondary | ICD-10-CM

## 2014-05-10 MED ORDER — HEPARIN SOD (PORK) LOCK FLUSH 100 UNIT/ML IV SOLN
500.0000 [IU] | Freq: Once | INTRAVENOUS | Status: AC | PRN
  Administered 2014-05-10: 500 [IU]
  Filled 2014-05-10: qty 5

## 2014-05-10 MED ORDER — SODIUM CHLORIDE 0.9 % IJ SOLN
10.0000 mL | INTRAMUSCULAR | Status: DC | PRN
  Administered 2014-05-10: 10 mL
  Filled 2014-05-10: qty 10

## 2014-05-11 ENCOUNTER — Telehealth: Payer: Self-pay | Admitting: *Deleted

## 2014-05-11 DIAGNOSIS — C169 Malignant neoplasm of stomach, unspecified: Secondary | ICD-10-CM

## 2014-05-11 MED ORDER — LORAZEPAM 0.5 MG PO TABS: 0.5000 mg | ORAL_TABLET | Freq: Three times a day (TID) | ORAL | Status: DC | PRN

## 2014-05-11 NOTE — Telephone Encounter (Signed)
Reports nausea not controlled well with the Zofran or Compazine. Asking if they could try Phenergan?  She is also taking Carafate qid-having minimal po intake. Had FOLFOX 10/19- Inquired why she had home health: per Dr. Barry Dienes was for monitoring of tube feedings and then to monitor meds/wound from prior feeding tube site, which has fallent out. Site is healed now. May need to change premeds with next tx/? Steroids post tx?

## 2014-05-11 NOTE — Addendum Note (Signed)
Addended by: Tania Ade on: 05/11/2014 05:02 PM   Modules accepted: Orders

## 2014-05-11 NOTE — Telephone Encounter (Signed)
Made patient aware that MD suggests adding Ativan 0.5 mg every 8 hours as needed. Continue the Zofran and Compazine also-alternating them. Will adjust her premeds with next treatment.

## 2014-05-16 ENCOUNTER — Telehealth: Payer: Self-pay | Admitting: *Deleted

## 2014-05-16 ENCOUNTER — Other Ambulatory Visit: Payer: Self-pay | Admitting: *Deleted

## 2014-05-16 ENCOUNTER — Ambulatory Visit (HOSPITAL_BASED_OUTPATIENT_CLINIC_OR_DEPARTMENT_OTHER): Payer: Medicare Other

## 2014-05-16 ENCOUNTER — Ambulatory Visit (HOSPITAL_BASED_OUTPATIENT_CLINIC_OR_DEPARTMENT_OTHER): Payer: Medicare Other | Admitting: Nurse Practitioner

## 2014-05-16 VITALS — Wt 102.2 lb

## 2014-05-16 VITALS — BP 111/68 | HR 82 | Temp 97.0°F

## 2014-05-16 DIAGNOSIS — C169 Malignant neoplasm of stomach, unspecified: Secondary | ICD-10-CM

## 2014-05-16 DIAGNOSIS — E860 Dehydration: Secondary | ICD-10-CM

## 2014-05-16 DIAGNOSIS — R112 Nausea with vomiting, unspecified: Secondary | ICD-10-CM

## 2014-05-16 LAB — CBC WITH DIFFERENTIAL/PLATELET
BASO%: 0.3 % (ref 0.0–2.0)
Basophils Absolute: 0 10*3/uL (ref 0.0–0.1)
EOS%: 1.5 % (ref 0.0–7.0)
Eosinophils Absolute: 0.1 10*3/uL (ref 0.0–0.5)
HCT: 35.6 % (ref 34.8–46.6)
HGB: 11.6 g/dL (ref 11.6–15.9)
LYMPH#: 2.7 10*3/uL (ref 0.9–3.3)
LYMPH%: 34.3 % (ref 14.0–49.7)
MCH: 28.4 pg (ref 25.1–34.0)
MCHC: 32.6 g/dL (ref 31.5–36.0)
MCV: 87.1 fL (ref 79.5–101.0)
MONO#: 0.4 10*3/uL (ref 0.1–0.9)
MONO%: 5.3 % (ref 0.0–14.0)
NEUT#: 4.7 10*3/uL (ref 1.5–6.5)
NEUT%: 58.6 % (ref 38.4–76.8)
Platelets: 185 10*3/uL (ref 145–400)
RBC: 4.09 10*6/uL (ref 3.70–5.45)
RDW: 14.7 % — ABNORMAL HIGH (ref 11.2–14.5)
WBC: 7.9 10*3/uL (ref 3.9–10.3)

## 2014-05-16 LAB — COMPREHENSIVE METABOLIC PANEL (CC13)
ALBUMIN: 3.4 g/dL — AB (ref 3.5–5.0)
ALT: 11 U/L (ref 0–55)
AST: 17 U/L (ref 5–34)
Alkaline Phosphatase: 48 U/L (ref 40–150)
Anion Gap: 11 mEq/L (ref 3–11)
BILIRUBIN TOTAL: 0.53 mg/dL (ref 0.20–1.20)
BUN: 18 mg/dL (ref 7.0–26.0)
CALCIUM: 9.3 mg/dL (ref 8.4–10.4)
CHLORIDE: 105 meq/L (ref 98–109)
CO2: 22 mEq/L (ref 22–29)
CREATININE: 0.8 mg/dL (ref 0.6–1.1)
Glucose: 101 mg/dl (ref 70–140)
Potassium: 3.7 mEq/L (ref 3.5–5.1)
Sodium: 138 mEq/L (ref 136–145)
Total Protein: 6.5 g/dL (ref 6.4–8.3)

## 2014-05-16 MED ORDER — SODIUM CHLORIDE 0.9 % IV SOLN
INTRAVENOUS | Status: DC
  Administered 2014-05-16: 16:00:00 via INTRAVENOUS

## 2014-05-16 NOTE — Patient Instructions (Signed)

## 2014-05-16 NOTE — Telephone Encounter (Signed)
Call from pt reporting she has been unable to keep anything down. Vomiting today, Lorazepam hasn't really helped. Pt has not been taking Zofran or Compazine since starting Lorazepam. Unable to tolerate fluids. Teaching reviewed, instructed pt to take Zofran now, will review with provider for further instruction. Reviewed above with Lattie Haw, NP: Work pt in to symptom management clinic. Orders received from Drue Second, NP for IVF and labs.

## 2014-05-17 ENCOUNTER — Encounter: Payer: Self-pay | Admitting: Nurse Practitioner

## 2014-05-17 DIAGNOSIS — E860 Dehydration: Secondary | ICD-10-CM | POA: Insufficient documentation

## 2014-05-17 DIAGNOSIS — R112 Nausea with vomiting, unspecified: Secondary | ICD-10-CM | POA: Insufficient documentation

## 2014-05-17 NOTE — Progress Notes (Signed)
SYMPTOM MANAGEMENT CLINIC   HPI: Lynn Morris 73 y.o. female diagnosed with gastric cancer.  Initiated FOLFOX chemotherapy last week.  Patient called the cancer Center today requesting an urgent care visit.  She states she received her initial cycle of FOLFOX chemotherapy last week on 05/08/2014.  She has been experiencing severe nausea/vomiting since her chemotherapy.  She has only been taking the Compazine and the Zofran for nausea until just yesterday.  She tried the lorazepam this afternoon; and it does seem to be helping with her nausea somewhat.  She feels very dehydrated today; his requested IV fluid rehydration.  She denies any diarrhea or constipation issues.  She denies any dysuria.  She denies any recent fevers or chills.   HPI  CURRENT THERAPY: Upcoming Treatment Dates - COLORECTAL FOLFOX q14d Days with orders from any treatment category:  05/29/2014      SCHEDULING COMMUNICATION      ondansetron (ZOFRAN) IVPB 8 mg      dexamethasone (DECADRON) injection 10 mg      oxaliplatin (ELOXATIN) 125 mg in dextrose 5 % 500 mL chemo infusion      leucovorin 584 mg in dextrose 5 % 250 mL infusion      fluorouracil (ADRUCIL) chemo injection 600 mg      fluorouracil (ADRUCIL) 3,500 mg in sodium chloride 0.9 % 150 mL chemo infusion      sodium chloride 0.9 % injection 10 mL      heparin lock flush 100 unit/mL      heparin lock flush 100 unit/mL      alteplase (CATHFLO ACTIVASE) injection 2 mg      sodium chloride 0.9 % injection 3 mL      Cold Pack 1 packet      Hot Pack 1 packet      dextrose 5 % solution      TREATMENT CONDITIONS 05/31/2014      SCHEDULING COMMUNICATION      sodium chloride 0.9 % injection 10 mL      heparin lock flush 100 unit/mL      heparin lock flush 100 unit/mL      alteplase (CATHFLO ACTIVASE) injection 2 mg      sodium chloride 0.9 % injection 3 mL      Cold Pack 1 packet 06/12/2014      SCHEDULING COMMUNICATION      ondansetron (ZOFRAN) IVPB 8 mg      dexamethasone (DECADRON) injection 10 mg      oxaliplatin (ELOXATIN) 125 mg in dextrose 5 % 500 mL chemo infusion      leucovorin 584 mg in dextrose 5 % 250 mL infusion      fluorouracil (ADRUCIL) chemo injection 600 mg      fluorouracil (ADRUCIL) 3,500 mg in sodium chloride 0.9 % 150 mL chemo infusion      sodium chloride 0.9 % injection 10 mL      heparin lock flush 100 unit/mL      heparin lock flush 100 unit/mL      alteplase (CATHFLO ACTIVASE) injection 2 mg      sodium chloride 0.9 % injection 3 mL      Cold Pack 1 packet      Hot Pack 1 packet      dextrose 5 % solution      TREATMENT CONDITIONS    ROS  Past Medical History  Diagnosis Date  . Esophageal stricture   . Hyperlipemia   . Arthritis  LG TOE  . Cataract     right eye and immature  . Complication of anesthesia   . PONV (postoperative nausea and vomiting)   . Hiatal hernia 12-13-13    intermittent nausea with vomiting  . Cancer 11/24/13    gastroesophageal carcinoma  . History of blood transfusion     no abnormal reaction noted  . Pneumonia     touch of it many yrs ago(>48yrs)  . History of bronchitis > 31yrs ago  . History of migraine     many yrs ago  . Esophageal reflux     was on Prilosec;has been off a yr  . Diverticulosis   . Anxiety     takes Zoloft every other day  . Optic neuritis     many yrs ago    Past Surgical History  Procedure Laterality Date  . Abdominal hysterectomy  1978    Partial   . Cesarean section  1964 & 1966  . Nose surgery  1970  . Appendectomy    . Abdominal hysterectomy    . Eus N/A 12/22/2013    Procedure: UPPER ENDOSCOPIC ULTRASOUND (EUS) LINEAR;  Surgeon: Milus Banister, MD;  Location: [facility];  Service: Endoscopy;  Laterality: N/A;  . Portacath placement N/A 12/27/2013    Procedure: INSERTION PORT-A-CATH;  Surgeon: Stark Klein, MD;  Location: [facility];  Service: General;  Laterality: N/A;  . Esophagogastroduodenoscopy      with dilitation  . Colonoscopy     . Laparoscopy N/A 04/04/2014    Procedure: DIAGNOSTIC LAPAROSCOPY;  Surgeon: Stark Klein, MD;  Location: [place];  Service: General;  Laterality: N/A;  . Partial gastrectomy N/A 04/04/2014    Procedure: TOTAL GASTRECTOMY;  Surgeon: Stark Klein, MD;  Location: [place];  Service: General;  Laterality: N/A;  . Jejunostomy N/A 04/04/2014    Procedure: ESOPHAGOJEJUNOSTOMY AND FEEDING JEJUNOSTOMY;  Surgeon: Stark Klein, MD;  Location: [facility];  Service: General;  Laterality: N/A;    has Gastric cancer; Anxiety and depression; Allergic rhinitis; Acid reflux; HLD (hyperlipidemia); Osteopenia; Avitaminosis D; Dermatitis seborrheica; Chronic recurrent major depressive disorder; Nausea with vomiting; and Dehydration on her problem list.     is allergic to codeine.    Medication List       This list is accurate as of: 05/16/14 11:59 PM.  Always use your most recent med list.               *STUDY* feeding supplement (IMPACT PEPTIDE 1.5) Liqd  Place 1,000 mLs into feeding tube continuous. 65 mL/hr for 14 hours.     CALCIUM 1200 PO  Take 2 tablets by mouth daily.     ibuprofen 200 MG tablet  Commonly known as:  ADVIL,MOTRIN  Take 200 mg by mouth every 6 (six) hours as needed for moderate pain.     lidocaine-prilocaine cream  Commonly known as:  EMLA  Apply to port a cath site one hour prior to use. Cover with plastic. Do not rub in.     LORazepam 0.5 MG tablet  Commonly known as:  ATIVAN  Take 1 tablet (0.5 mg total) by mouth every 8 (eight) hours as needed (nausea).     mirtazapine 15 MG tablet  Commonly known as:  REMERON  Take 1 tablet (15 mg total) by mouth at bedtime.     multivitamin with minerals Tabs tablet  Take 1 tablet by mouth daily.     ondansetron 8 MG tablet  Commonly known as:  ZOFRAN  Take 1 tablet (8 mg total) by mouth every 8 (eight) hours as needed for nausea or vomiting.     oxyCODONE 5 MG/5ML solution  Commonly known as:  ROXICODONE  Take 10 mLs (10 mg total)  by mouth every 4 (four) hours as needed (pain).     sertraline 100 MG tablet  Commonly known as:  ZOLOFT  Take 100 mg by mouth every other day.     sucralfate 1 GM/10ML suspension  Commonly known as:  CARAFATE  Take 10 mLs (1 g total) by mouth 4 (four) times daily -  with meals and at bedtime.     vitamin B-12 500 MCG tablet  Commonly known as:  CYANOCOBALAMIN  Take 500 mcg by mouth daily.     Vitamin D3 2000 UNITS Tabs  Take 1 tablet by mouth.         PHYSICAL EXAMINATION  Weight 102 lb 3.2 oz (46.358 kg).  Vitals: BP 111/68, HR 82, temp 97  Physical Exam  Nursing note and vitals reviewed. Constitutional: She is oriented to person, place, and time. Vital signs are normal. She appears dehydrated. She appears unhealthy.  HENT:  Head: Normocephalic and atraumatic.  Eyes: Conjunctivae and EOM are normal. Pupils are equal, round, and reactive to light. No scleral icterus.  Neck: Normal range of motion. No JVD present.  Pulmonary/Chest: Effort normal. No respiratory distress.  Musculoskeletal: Normal range of motion. She exhibits no edema and no tenderness.  Neurological: She is alert and oriented to person, place, and time.  Skin: Skin is warm and dry. No rash noted. No erythema.  Psychiatric: Affect normal.    LABORATORY DATA:. Appointment on 05/16/2014  Component Date Value Ref Range Status  . WBC 05/16/2014 7.9  3.9 - 10.3 10e3/uL Final  . NEUT# 05/16/2014 4.7  1.5 - 6.5 10e3/uL Final  . HGB 05/16/2014 11.6  11.6 - 15.9 g/dL Final  . HCT 05/16/2014 35.6  34.8 - 46.6 % Final  . Platelets 05/16/2014 185  145 - 400 10e3/uL Final  . MCV 05/16/2014 87.1  79.5 - 101.0 fL Final  . MCH 05/16/2014 28.4  25.1 - 34.0 pg Final  . MCHC 05/16/2014 32.6  31.5 - 36.0 g/dL Final  . RBC 05/16/2014 4.09  3.70 - 5.45 10e6/uL Final  . RDW 05/16/2014 14.7* 11.2 - 14.5 % Final  . lymph# 05/16/2014 2.7  0.9 - 3.3 10e3/uL Final  . MONO# 05/16/2014 0.4  0.1 - 0.9 10e3/uL Final  .  Eosinophils Absolute 05/16/2014 0.1  0.0 - 0.5 10e3/uL Final  . Basophils Absolute 05/16/2014 0.0  0.0 - 0.1 10e3/uL Final  . NEUT% 05/16/2014 58.6  38.4 - 76.8 % Final  . LYMPH% 05/16/2014 34.3  14.0 - 49.7 % Final  . MONO% 05/16/2014 5.3  0.0 - 14.0 % Final  . EOS% 05/16/2014 1.5  0.0 - 7.0 % Final  . BASO% 05/16/2014 0.3  0.0 - 2.0 % Final  . Sodium 05/16/2014 138  136 - 145 mEq/L Final  . Potassium 05/16/2014 3.7  3.5 - 5.1 mEq/L Final  . Chloride 05/16/2014 105  98 - 109 mEq/L Final  . CO2 05/16/2014 22  22 - 29 mEq/L Final  . Glucose 05/16/2014 101  70 - 140 mg/dl Final  . BUN 05/16/2014 18.0  7.0 - 26.0 mg/dL Final  . Creatinine 05/16/2014 0.8  0.6 - 1.1 mg/dL Final  . Total Bilirubin 05/16/2014 0.53  0.20 - 1.20 mg/dL Final  . Alkaline Phosphatase 05/16/2014 48  40 - 150 U/L Final  . AST 05/16/2014 17  5 - 34 U/L Final  . ALT 05/16/2014 11  0 - 55 U/L Final  . Total Protein 05/16/2014 6.5  6.4 - 8.3 g/dL Final  . Albumin 05/16/2014 3.4* 3.5 - 5.0 g/dL Final  . Calcium 05/16/2014 9.3  8.4 - 10.4 mg/dL Final  . Anion Gap 05/16/2014 11  3 - 11 mEq/L Final     RADIOGRAPHIC STUDIES: No results found.  ASSESSMENT/PLAN:    Gastric cancer  Assessment & Plan Patient initiated cycle 1 of her FOLFOX chemotherapy on 05/08/2014.  She will be scheduled for cycle 2 of the same regimen on 05/29/2014.   Nausea with vomiting  Assessment & Plan Patient states she has been experiencing severe nausea/vomiting since her initial cycle of chemotherapy last week.  She states that she initially only tried the Zofran and Compazine; but has since had a lorazepam to her regimen.  She fills up a lorazepam has helped with her nausea/vomiting somewhat.  Patient was advised that she could alternate all 3 of her anti-emetics; but advised against taking all 3 of these medications at the same time.  Also discussed need for close liquid diet until patient able to tolerate more advanced  diet.   Dehydration  Assessment & Plan Patient does appear dehydrated secondary to severe nausea/vomiting post chemotherapy.  Patient was given 1 L normal saline IV fluid rehydration while cancer Center today.  I she states that she is much better after hydration.  Patient also confirm that she still has Compazine, Zofran, and lorazepam at home to take for her nausea as well.  Advised patient that she should college she fills a continued dehydration: We will gladly give her additional IV fluid hydration.   Patient stated understanding of all instructions; and was in agreement with this plan of care. The patient knows to call the clinic with any problems, questions or concerns.   Review/collaboration with Dr. Benay Spice regarding all aspects of patient's visit today.   Total time spent with patient was 15 minutes;  with greater than 75 percent of that time spent in face to face counseling regarding her symptoms, and coordination of care and follow up.  Disclaimer: This note was dictated with voice recognition software. Similar sounding words can inadvertently be transcribed and may not be corrected upon review.   Drue Second, NP 05/17/2014

## 2014-05-17 NOTE — Assessment & Plan Note (Signed)
Patient initiated cycle 1 of her FOLFOX chemotherapy on 05/08/2014.  She will be scheduled for cycle 2 of the same regimen on 05/29/2014.

## 2014-05-17 NOTE — Assessment & Plan Note (Addendum)
Patient states she has been experiencing severe nausea/vomiting since her initial cycle of chemotherapy last week.  She states that she initially only tried the Zofran and Compazine; but has since had a lorazepam to her regimen.  She fills up a lorazepam has helped with her nausea/vomiting somewhat.  Patient was advised that she could alternate all 3 of her anti-emetics; but advised against taking all 3 of these medications at the same time.  Also discussed need for close liquid diet until patient able to tolerate more advanced diet.

## 2014-05-17 NOTE — Assessment & Plan Note (Signed)
Patient does appear dehydrated secondary to severe nausea/vomiting post chemotherapy.  Patient was given 1 L normal saline IV fluid rehydration while cancer Center today.  I she states that she is much better after hydration.  Patient also confirm that she still has Compazine, Zofran, and lorazepam at home to take for her nausea as well.  Advised patient that she should college she fills a continued dehydration: We will gladly give her additional IV fluid hydration.

## 2014-05-18 ENCOUNTER — Telehealth: Payer: Self-pay | Admitting: *Deleted

## 2014-05-18 NOTE — Telephone Encounter (Signed)
Called patient to follow up. Pt states she feels so much better after receiving IVF. Denies any diarrhea or nausea at present. Is tolerating fluids well and eating what she feels like she can eat. Will call us if has any further problems

## 2014-05-18 NOTE — Telephone Encounter (Signed)
Message copied by Patton Salles on Thu May 18, 2014 10:50 AM ------      Message from: Drue Second R      Created: Wed May 17, 2014  5:28 PM       PROVIDER:  [facility]      Triage: follow up call 24-48 hours please. ------

## 2014-05-25 ENCOUNTER — Ambulatory Visit: Payer: Medicare Other | Admitting: Oncology

## 2014-05-28 ENCOUNTER — Other Ambulatory Visit: Payer: Self-pay | Admitting: Oncology

## 2014-05-29 ENCOUNTER — Inpatient Hospital Stay: Payer: Medicare Other

## 2014-05-29 ENCOUNTER — Ambulatory Visit (HOSPITAL_BASED_OUTPATIENT_CLINIC_OR_DEPARTMENT_OTHER): Payer: Medicare Other | Admitting: Oncology

## 2014-05-29 ENCOUNTER — Telehealth: Payer: Self-pay | Admitting: Oncology

## 2014-05-29 ENCOUNTER — Telehealth: Payer: Self-pay | Admitting: *Deleted

## 2014-05-29 ENCOUNTER — Other Ambulatory Visit: Payer: Self-pay | Admitting: *Deleted

## 2014-05-29 ENCOUNTER — Other Ambulatory Visit (HOSPITAL_BASED_OUTPATIENT_CLINIC_OR_DEPARTMENT_OTHER): Payer: Medicare Other

## 2014-05-29 ENCOUNTER — Ambulatory Visit: Payer: Medicare Other | Admitting: Nutrition

## 2014-05-29 VITALS — BP 120/54 | HR 85 | Temp 98.4°F | Resp 18 | Ht 61.0 in | Wt 100.3 lb

## 2014-05-29 DIAGNOSIS — C169 Malignant neoplasm of stomach, unspecified: Secondary | ICD-10-CM

## 2014-05-29 DIAGNOSIS — R1310 Dysphagia, unspecified: Secondary | ICD-10-CM

## 2014-05-29 DIAGNOSIS — R112 Nausea with vomiting, unspecified: Secondary | ICD-10-CM

## 2014-05-29 DIAGNOSIS — D702 Other drug-induced agranulocytosis: Secondary | ICD-10-CM

## 2014-05-29 LAB — CBC WITH DIFFERENTIAL/PLATELET
BASO%: 0.6 % (ref 0.0–2.0)
Basophils Absolute: 0 10*3/uL (ref 0.0–0.1)
EOS ABS: 0 10*3/uL (ref 0.0–0.5)
EOS%: 1.5 % (ref 0.0–7.0)
HCT: 37.2 % (ref 34.8–46.6)
HGB: 12.1 g/dL (ref 11.6–15.9)
LYMPH%: 47.5 % (ref 14.0–49.7)
MCH: 28.2 pg (ref 25.1–34.0)
MCHC: 32.4 g/dL (ref 31.5–36.0)
MCV: 86.9 fL (ref 79.5–101.0)
MONO#: 0.4 10*3/uL (ref 0.1–0.9)
MONO%: 18.1 % — ABNORMAL HIGH (ref 0.0–14.0)
NEUT%: 32.3 % — ABNORMAL LOW (ref 38.4–76.8)
NEUTROS ABS: 0.8 10*3/uL — AB (ref 1.5–6.5)
PLATELETS: 273 10*3/uL (ref 145–400)
RBC: 4.28 10*6/uL (ref 3.70–5.45)
RDW: 15 % — AB (ref 11.2–14.5)
WBC: 2.4 10*3/uL — AB (ref 3.9–10.3)
lymph#: 1.1 10*3/uL (ref 0.9–3.3)

## 2014-05-29 LAB — COMPREHENSIVE METABOLIC PANEL (CC13)
ALBUMIN: 3.2 g/dL — AB (ref 3.5–5.0)
ALT: 9 U/L (ref 0–55)
ANION GAP: 6 meq/L (ref 3–11)
AST: 17 U/L (ref 5–34)
Alkaline Phosphatase: 48 U/L (ref 40–150)
BUN: 12.2 mg/dL (ref 7.0–26.0)
CHLORIDE: 106 meq/L (ref 98–109)
CO2: 29 meq/L (ref 22–29)
Calcium: 9.4 mg/dL (ref 8.4–10.4)
Creatinine: 0.9 mg/dL (ref 0.6–1.1)
GLUCOSE: 108 mg/dL (ref 70–140)
POTASSIUM: 3.7 meq/L (ref 3.5–5.1)
SODIUM: 141 meq/L (ref 136–145)
TOTAL PROTEIN: 6.1 g/dL — AB (ref 6.4–8.3)
Total Bilirubin: 0.52 mg/dL (ref 0.20–1.20)

## 2014-05-29 MED ORDER — HEPARIN SOD (PORK) LOCK FLUSH 100 UNIT/ML IV SOLN
500.0000 [IU] | Freq: Once | INTRAVENOUS | Status: AC
  Administered 2014-05-29: 500 [IU] via INTRAVENOUS
  Filled 2014-05-29: qty 5

## 2014-05-29 MED ORDER — LORAZEPAM 2 MG/ML IJ SOLN
INTRAMUSCULAR | Status: AC
  Filled 2014-05-29: qty 1

## 2014-05-29 MED ORDER — SODIUM CHLORIDE 0.9 % IJ SOLN
10.0000 mL | INTRAMUSCULAR | Status: DC | PRN
  Administered 2014-05-29: 10 mL via INTRAVENOUS
  Filled 2014-05-29: qty 10

## 2014-05-29 MED ORDER — LORAZEPAM 2 MG/ML IJ SOLN
0.5000 mg | Freq: Once | INTRAMUSCULAR | Status: AC
  Administered 2014-05-29: 0.5 mg via INTRAVENOUS

## 2014-05-29 MED ORDER — METOCLOPRAMIDE HCL 10 MG PO TABS: 10.0000 mg | ORAL_TABLET | Freq: Three times a day (TID) | ORAL | Status: DC

## 2014-05-29 MED ORDER — SODIUM CHLORIDE 0.9 % IV SOLN
Freq: Once | INTRAVENOUS | Status: DC
  Administered 2014-05-29: 12:00:00 via INTRAVENOUS

## 2014-05-29 NOTE — Progress Notes (Signed)
Patient presents to nutrition followup. Patient states she has vomiting on a daily basis and has vomited 3 times since yesterday afternoon. Doesn't appear to have nausea with food odors anymore. Patient verbalizes the fear of eating secondary to fear of vomiting. Weight decreased and documented as 100.3 pounds November 9 down from 109.1 pounds October 9. Patient is attempting to drink oral nutrition supplements but can only typically drink one half can at a time. Patient requesting IV fluids.  States this helped her greatly last time.    Nutrition diagnosis: Unintended weight loss continues.  Intervention: Educated patient to take medications for nausea as directed by physician. Recommended patient consume salty foods to help control nausea.  Educated patient to increase oral nutrition supplements to goal of 4 daily. Provided patient with supplement samples. Questions answered and teach back method used.  Monitoring, evaluation, goals: Patient will tolerate an increase in oral intake of soft foods, as well as oral nutrition supplements to minimize weight loss.  Next visit:Monday, November 16.  **Disclaimer: This note was dictated with voice recognition software. Similar sounding words can inadvertently be transcribed and this note may contain transcription errors which may not have been corrected upon publication of note.**

## 2014-05-29 NOTE — Telephone Encounter (Signed)
Pt confirmed labs/ov per 11/09 POF, sent msg to add chemo, gave pt AVS ..... KJ °

## 2014-05-29 NOTE — Telephone Encounter (Signed)
Patient called from [facility] at [facility] reporting the "pharmacy has not received the order for the nausea pill that begins with an R".  Verbal order received and read back for Reglan 10 MG AC meals.  Spoke with pharmacist and verbal order given.

## 2014-05-29 NOTE — Progress Notes (Signed)
[facility] OFFICE PROGRESS NOTE   Diagnosis: gastroesophageal carcinoma  INTERVAL HISTORY:   Lynn Morris returns as scheduled. She completed the first cycle of adjuvant FOLFOXbeginning 05/08/2014.she developed nausea and vomiting following chemotherapy and this has persisted. She reports intermittent nausea and vomiting, especially after eating. Anti-emetics have not helped.she presented to the [facility] on 05/17/2014 and received intravenous fluids. No neuropathy symptoms. She has been able to very little secondary to a fear of vomiting.  Objective:  Vital signs in last 24 hours:  Blood pressure 120/54, pulse 85, temperature 98.4 F (36.9 C), temperature source Oral, resp. rate 18, height 5\' 1"  (1.549 m), weight 100 lb 4.8 oz (45.496 kg).    HEENT: no thrush or ulcers, mucous membranes are moist Resp: lungs clear bilaterally Cardio: regular rate and rhythm GI: the abdomen is soft and nontender, no mass Vascular: no leg edema  Portacath/PICC-without erythema  Lab Results:  Lab Results  Component Value Date   WBC 2.4* 05/29/2014   HGB 12.1 05/29/2014   HCT 37.2 05/29/2014   MCV 86.9 05/29/2014   PLT 273 05/29/2014   NEUTROABS 0.8* 05/29/2014      Lab Results  Component Value Date   CEA 0.7 12/20/2013     Medications: I have reviewed the patient's current medications.  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   Cycle 1 CAPOX beginning 01/02/2014.   Cycle 2 CAPOX beginning 01/23/2014.   Cycle 3 CAPOX beginning 02/13/2014.   Restaging PET scan 02/27/2014 with mild hypermetabolism in the hilar regions with an SUV max on the right of 4.9. Previously seen mild hypermetabolism associated with the distal esophagus not readily identified. Mild uptake in the stomach without a focal lesion or uptake above blood pool.   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  Cycle 1 adjuvant FOLFOX 05/08/2014 2. History of gastroesophageal reflux disease and peptic stricture, status post an esophageal dilatation procedure in August 2012 3. History of solid dysphagia secondary to #1.  4. Anxiety. 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 6. Anorexia. Trial of Remeron initiated 05/08/2014. 7. Nausea and vomiting-persistent following FOLFOX 05/08/2014 8. Neutropenia secondary to chemotherapy, cycle 2 FOLFOX held 05/29/2014.   Disposition:  Lynn Morris reports intermittent nausea and vomiting for the past few weeks. I think it is unlikely the nausea is related to chemotherapy.she tolerated a proximal chemotherapy without significant nausea in the preoperative setting.  I discussed the case with Dr. Cher Nakai. We will prescribe Reglan. If this does not help she will try a scopolamine patch. The nausea may be related to a component of gastroparesis following the total gastrectomy. We will consider a barium swallow if the nausea persists. She describes  1 episode of dysphagia after eating  chicken.  Chemotherapy will be held today secondary to neutropenia and nausea.Neulasta will be added with the next cycle of chemotherapy. She knows to contact us for a fever. Lynn Morris will return for an office visit in 1 week. She will contact us within the next 1-2 days for persistent nausea.  Lynn Morris received intravenous fluids and anti-emetics at the Cancer center today.  Approximate 40 minutes were spent with the patient today. The majority of the time was used for counseling and coordination of care.  Betsy Coder, MD  05/29/2014  5:30 PM

## 2014-05-29 NOTE — Telephone Encounter (Signed)
Per staff message and POF I have scheduled appts. Advised scheduler of appts. JMW  

## 2014-05-29 NOTE — Progress Notes (Signed)
Pt given IV fluids in exam room. 1325: Vomited x1. Order received for Lorazepam. Pt reported substernal pressure/ pain 10/10 prior to vomiting 2 more times. Reported relief of pain after this.

## 2014-05-29 NOTE — Patient Instructions (Signed)
Dehydration, Adult Dehydration is when you lose more fluids from the body than you take in. Vital organs like the kidneys, brain, and heart cannot function without a proper amount of fluids and salt. Any loss of fluids from the body can cause dehydration.  CAUSES   Vomiting.  Diarrhea.  Excessive sweating.  Excessive urine output.  Fever. SYMPTOMS  Mild dehydration  Thirst.  Dry lips.  Slightly dry mouth. Moderate dehydration  Very dry mouth.  Sunken eyes.  Skin does not bounce back quickly when lightly pinched and released.  Dark urine and decreased urine production.  Decreased tear production.  Headache. Severe dehydration  Very dry mouth.  Extreme thirst.  Rapid, weak pulse (more than 100 beats per minute at rest).  Cold hands and feet.  Not able to sweat in spite of heat and temperature.  Rapid breathing.  Blue lips.  Confusion and lethargy.  Difficulty being awakened.  Minimal urine production.  No tears. DIAGNOSIS  Your caregiver will diagnose dehydration based on your symptoms and your exam. Blood and urine tests will help confirm the diagnosis. The diagnostic evaluation should also identify the cause of dehydration. TREATMENT  Treatment of mild or moderate dehydration can often be done at home by increasing the amount of fluids that you drink. It is best to drink small amounts of fluid more often. Drinking too much at one time can make vomiting worse. Refer to the home care instructions below. Severe dehydration needs to be treated at the hospital where you will probably be given intravenous (IV) fluids that contain water and electrolytes. HOME CARE INSTRUCTIONS   Ask your caregiver about specific rehydration instructions.  Drink enough fluids to keep your urine clear or pale yellow.  Drink small amounts frequently if you have nausea and vomiting.  Eat as you normally do.  Avoid:  Foods or drinks high in sugar.  Carbonated  drinks.  Juice.  Extremely hot or cold fluids.  Drinks with caffeine.  Fatty, greasy foods.  Alcohol.  Tobacco.  Overeating.  Gelatin desserts.  Wash your hands well to avoid spreading bacteria and viruses.  Only take over-the-counter or prescription medicines for pain, discomfort, or fever as directed by your caregiver.  Ask your caregiver if you should continue all prescribed and over-the-counter medicines.  Keep all follow-up appointments with your caregiver. SEEK MEDICAL CARE IF:  You have abdominal pain and it increases or stays in one area (localizes).  You have a rash, stiff neck, or severe headache.  You are irritable, sleepy, or difficult to awaken.  You are weak, dizzy, or extremely thirsty. SEEK IMMEDIATE MEDICAL CARE IF:   You are unable to keep fluids down or you get worse despite treatment.  You have frequent episodes of vomiting or diarrhea.  You have blood or green matter (bile) in your vomit.  You have blood in your stool or your stool looks black and tarry.  You have not urinated in 6 to 8 hours, or you have only urinated a small amount of very dark urine.  You have a fever.  You faint. MAKE SURE YOU:   Understand these instructions.  Will watch your condition.  Will get help right away if you are not doing well or get worse. Document Released: 07/07/2005 Document Revised: 09/29/2011 Document Reviewed: 02/24/2011 Exit[employer] Patient Information 2015 [employer]. This information is not intended to replace advice given to you by your health care provider. Make sure you discuss any questions you have with your health care   provider.  

## 2014-05-30 ENCOUNTER — Telehealth: Payer: Self-pay | Admitting: Nutrition

## 2014-05-30 ENCOUNTER — Telehealth: Payer: Self-pay | Admitting: *Deleted

## 2014-05-30 ENCOUNTER — Ambulatory Visit: Payer: Medicare Other | Admitting: Oncology

## 2014-05-30 NOTE — Telephone Encounter (Signed)
Call from pt reporting nausea has resolved with Reglan. Has been able to eat today. Dr. Benay Spice made aware.

## 2014-05-30 NOTE — Telephone Encounter (Signed)
Contacted patient via phone for followup on nutrition intake. Patient reports Reglan has worked well so far today.  She states she feels better.  She denies any vomiting today and was able to keep down Cheerios and rice. Encouraged patient to continue small amounts of bland food throughout the day.  Recommended 4 ounces of Ensure Plus as tolerated between meals. Patient verbalizes understanding and appreciation. Followup on Monday, November 16.

## 2014-06-02 ENCOUNTER — Telehealth: Payer: Self-pay | Admitting: *Deleted

## 2014-06-02 ENCOUNTER — Other Ambulatory Visit: Payer: Self-pay | Admitting: *Deleted

## 2014-06-02 ENCOUNTER — Ambulatory Visit: Payer: Medicare Other | Admitting: Nutrition

## 2014-06-02 ENCOUNTER — Ambulatory Visit (HOSPITAL_BASED_OUTPATIENT_CLINIC_OR_DEPARTMENT_OTHER): Payer: Medicare Other

## 2014-06-02 VITALS — BP 131/70 | HR 85 | Temp 97.1°F | Resp 18

## 2014-06-02 DIAGNOSIS — C169 Malignant neoplasm of stomach, unspecified: Secondary | ICD-10-CM

## 2014-06-02 DIAGNOSIS — R1310 Dysphagia, unspecified: Secondary | ICD-10-CM

## 2014-06-02 DIAGNOSIS — R112 Nausea with vomiting, unspecified: Secondary | ICD-10-CM

## 2014-06-02 MED ORDER — SODIUM CHLORIDE 0.9 % IJ SOLN
10.0000 mL | INTRAMUSCULAR | Status: DC | PRN
  Administered 2014-06-02: 10 mL via INTRAVENOUS
  Filled 2014-06-02: qty 10

## 2014-06-02 MED ORDER — HEPARIN SOD (PORK) LOCK FLUSH 100 UNIT/ML IV SOLN
500.0000 [IU] | Freq: Once | INTRAVENOUS | Status: AC
  Administered 2014-06-02: 500 [IU] via INTRAVENOUS
  Filled 2014-06-02: qty 5

## 2014-06-02 MED ORDER — SODIUM CHLORIDE 0.9 % IV SOLN
Freq: Once | INTRAVENOUS | Status: DC
  Administered 2014-06-02: 11:00:00 via INTRAVENOUS

## 2014-06-02 NOTE — Telephone Encounter (Signed)
Message from [facility], pt is requesting IV fluids today. Returned call, pt reports she feels weak. No vomiting, but doesn't think she's getting in enough food or fluids. Reviewed with Dr. Benay Spice: Order received.

## 2014-06-02 NOTE — Progress Notes (Signed)
Patient here because she is not able to do much oral intake.

## 2014-06-02 NOTE — Patient Instructions (Signed)
Dehydration, Adult Dehydration is when you lose more fluids from the body than you take in. Vital organs like the kidneys, brain, and heart cannot function without a proper amount of fluids and salt. Any loss of fluids from the body can cause dehydration.  CAUSES   Vomiting.  Diarrhea.  Excessive sweating.  Excessive urine output.  Fever. SYMPTOMS  Mild dehydration  Thirst.  Dry lips.  Slightly dry mouth. Moderate dehydration  Very dry mouth.  Sunken eyes.  Skin does not bounce back quickly when lightly pinched and released.  Dark urine and decreased urine production.  Decreased tear production.  Headache. Severe dehydration  Very dry mouth.  Extreme thirst.  Rapid, weak pulse (more than 100 beats per minute at rest).  Cold hands and feet.  Not able to sweat in spite of heat and temperature.  Rapid breathing.  Blue lips.  Confusion and lethargy.  Difficulty being awakened.  Minimal urine production.  No tears. DIAGNOSIS  Your caregiver will diagnose dehydration based on your symptoms and your exam. Blood and urine tests will help confirm the diagnosis. The diagnostic evaluation should also identify the cause of dehydration. TREATMENT  Treatment of mild or moderate dehydration can often be done at home by increasing the amount of fluids that you drink. It is best to drink small amounts of fluid more often. Drinking too much at one time can make vomiting worse. Refer to the home care instructions below. Severe dehydration needs to be treated at the hospital where you will probably be given intravenous (IV) fluids that contain water and electrolytes. HOME CARE INSTRUCTIONS   Ask your caregiver about specific rehydration instructions.  Drink enough fluids to keep your urine clear or pale yellow.  Drink small amounts frequently if you have nausea and vomiting.  Eat as you normally do.  Avoid:  Foods or drinks high in sugar.  Carbonated  drinks.  Juice.  Extremely hot or cold fluids.  Drinks with caffeine.  Fatty, greasy foods.  Alcohol.  Tobacco.  Overeating.  Gelatin desserts.  Wash your hands well to avoid spreading bacteria and viruses.  Only take over-the-counter or prescription medicines for pain, discomfort, or fever as directed by your caregiver.  Ask your caregiver if you should continue all prescribed and over-the-counter medicines.  Keep all follow-up appointments with your caregiver. SEEK MEDICAL CARE IF:  You have abdominal pain and it increases or stays in one area (localizes).  You have a rash, stiff neck, or severe headache.  You are irritable, sleepy, or difficult to awaken.  You are weak, dizzy, or extremely thirsty. SEEK IMMEDIATE MEDICAL CARE IF:   You are unable to keep fluids down or you get worse despite treatment.  You have frequent episodes of vomiting or diarrhea.  You have blood or green matter (bile) in your vomit.  You have blood in your stool or your stool looks black and tarry.  You have not urinated in 6 to 8 hours, or you have only urinated a small amount of very dark urine.  You have a fever.  You faint. MAKE SURE YOU:   Understand these instructions.  Will watch your condition.  Will get help right away if you are not doing well or get worse. Document Released: 07/07/2005 Document Revised: 09/29/2011 Document Reviewed: 02/24/2011 Exit[employer] Patient Information 2015 [employer]. This information is not intended to replace advice given to you by your health care provider. Make sure you discuss any questions you have with your health care   provider.  

## 2014-06-02 NOTE — Telephone Encounter (Signed)
Per pharmacy I have scheduled appt 

## 2014-06-02 NOTE — Progress Notes (Addendum)
Patient called requesting IV fluids.  She states she has left message with Dr. Gearldine Shown nurse.  Patient reports she feels weak and shaky.  She is still not eating very much but denies vomiting since Wednesday.  She is only tolerating one Ensure Plus daily.  Educated patient to try to drink one Ensure Plus before 12 noon daily and one ensure plus before bedtime daily, increasing to twice a day.  Provided patient with several differenct food options to add to her intake.  Questions were answered and teach back method used.  Will followup with patient November 16.  Patient weighed 103.4 lb today.  **Disclaimer: This note was dictated with voice recognition software. Similar sounding words can inadvertently be transcribed and this note may contain transcription errors which may not have been corrected upon publication of note.**

## 2014-06-05 ENCOUNTER — Ambulatory Visit (HOSPITAL_BASED_OUTPATIENT_CLINIC_OR_DEPARTMENT_OTHER): Payer: Medicare Other

## 2014-06-05 ENCOUNTER — Other Ambulatory Visit (HOSPITAL_BASED_OUTPATIENT_CLINIC_OR_DEPARTMENT_OTHER): Payer: Medicare Other

## 2014-06-05 ENCOUNTER — Telehealth: Payer: Self-pay | Admitting: Nurse Practitioner

## 2014-06-05 ENCOUNTER — Ambulatory Visit: Payer: Medicare Other | Admitting: Nutrition

## 2014-06-05 ENCOUNTER — Ambulatory Visit (HOSPITAL_BASED_OUTPATIENT_CLINIC_OR_DEPARTMENT_OTHER): Payer: Medicare Other | Admitting: Nurse Practitioner

## 2014-06-05 VITALS — BP 110/78 | HR 75 | Temp 97.4°F | Resp 18 | Ht 61.0 in | Wt 102.0 lb

## 2014-06-05 DIAGNOSIS — R1310 Dysphagia, unspecified: Secondary | ICD-10-CM

## 2014-06-05 DIAGNOSIS — C189 Malignant neoplasm of colon, unspecified: Secondary | ICD-10-CM

## 2014-06-05 DIAGNOSIS — C169 Malignant neoplasm of stomach, unspecified: Secondary | ICD-10-CM

## 2014-06-05 DIAGNOSIS — D6959 Other secondary thrombocytopenia: Secondary | ICD-10-CM

## 2014-06-05 DIAGNOSIS — Z5111 Encounter for antineoplastic chemotherapy: Secondary | ICD-10-CM

## 2014-06-05 DIAGNOSIS — R112 Nausea with vomiting, unspecified: Secondary | ICD-10-CM

## 2014-06-05 DIAGNOSIS — Z95828 Presence of other vascular implants and grafts: Secondary | ICD-10-CM

## 2014-06-05 LAB — CBC WITH DIFFERENTIAL/PLATELET
BASO%: 0.4 % (ref 0.0–2.0)
Basophils Absolute: 0 10*3/uL (ref 0.0–0.1)
EOS%: 0.7 % (ref 0.0–7.0)
Eosinophils Absolute: 0 10*3/uL (ref 0.0–0.5)
HCT: 33.9 % — ABNORMAL LOW (ref 34.8–46.6)
HGB: 11.2 g/dL — ABNORMAL LOW (ref 11.6–15.9)
LYMPH%: 23.6 % (ref 14.0–49.7)
MCH: 28.5 pg (ref 25.1–34.0)
MCHC: 33 g/dL (ref 31.5–36.0)
MCV: 86.3 fL (ref 79.5–101.0)
MONO#: 0.5 10*3/uL (ref 0.1–0.9)
MONO%: 9.5 % (ref 0.0–14.0)
NEUT#: 3.5 10*3/uL (ref 1.5–6.5)
NEUT%: 65.8 % (ref 38.4–76.8)
Platelets: 187 10*3/uL (ref 145–400)
RBC: 3.93 10*6/uL (ref 3.70–5.45)
RDW: 14.4 % (ref 11.2–14.5)
WBC: 5.4 10*3/uL (ref 3.9–10.3)
lymph#: 1.3 10*3/uL (ref 0.9–3.3)

## 2014-06-05 LAB — COMPREHENSIVE METABOLIC PANEL (CC13)
ALBUMIN: 2.9 g/dL — AB (ref 3.5–5.0)
ALK PHOS: 37 U/L — AB (ref 40–150)
ALT: 9 U/L (ref 0–55)
AST: 16 U/L (ref 5–34)
Anion Gap: 4 mEq/L (ref 3–11)
BUN: 11 mg/dL (ref 7.0–26.0)
CHLORIDE: 109 meq/L (ref 98–109)
CO2: 25 mEq/L (ref 22–29)
Calcium: 8.6 mg/dL (ref 8.4–10.4)
Creatinine: 0.7 mg/dL (ref 0.6–1.1)
Glucose: 119 mg/dl (ref 70–140)
Potassium: 3.6 mEq/L (ref 3.5–5.1)
SODIUM: 138 meq/L (ref 136–145)
TOTAL PROTEIN: 5.5 g/dL — AB (ref 6.4–8.3)
Total Bilirubin: 0.58 mg/dL (ref 0.20–1.20)

## 2014-06-05 MED ORDER — DEXTROSE 5 % IV SOLN
Freq: Once | INTRAVENOUS | Status: AC
  Administered 2014-06-05: 13:00:00 via INTRAVENOUS

## 2014-06-05 MED ORDER — DEXAMETHASONE SODIUM PHOSPHATE 10 MG/ML IJ SOLN
INTRAMUSCULAR | Status: AC
  Filled 2014-06-05: qty 1

## 2014-06-05 MED ORDER — PALONOSETRON HCL INJECTION 0.25 MG/5ML
0.2500 mg | Freq: Once | INTRAVENOUS | Status: AC
  Administered 2014-06-05: 0.25 mg via INTRAVENOUS

## 2014-06-05 MED ORDER — SODIUM CHLORIDE 0.9 % IJ SOLN
10.0000 mL | INTRAMUSCULAR | Status: DC | PRN
  Filled 2014-06-05: qty 10

## 2014-06-05 MED ORDER — SODIUM CHLORIDE 0.9 % IJ SOLN
10.0000 mL | INTRAMUSCULAR | Status: DC | PRN
  Administered 2014-06-05: 10 mL via INTRAVENOUS
  Filled 2014-06-05: qty 10

## 2014-06-05 MED ORDER — DEXAMETHASONE SODIUM PHOSPHATE 10 MG/ML IJ SOLN
10.0000 mg | Freq: Once | INTRAMUSCULAR | Status: AC
  Administered 2014-06-05: 10 mg via INTRAVENOUS

## 2014-06-05 MED ORDER — FLUOROURACIL CHEMO INJECTION 2.5 GM/50ML
400.0000 mg/m2 | Freq: Once | INTRAVENOUS | Status: AC
  Administered 2014-06-05: 600 mg via INTRAVENOUS
  Filled 2014-06-05: qty 12

## 2014-06-05 MED ORDER — OXALIPLATIN CHEMO INJECTION 100 MG/20ML
85.0000 mg/m2 | Freq: Once | INTRAVENOUS | Status: AC
  Administered 2014-06-05: 125 mg via INTRAVENOUS
  Filled 2014-06-05: qty 25

## 2014-06-05 MED ORDER — FOSAPREPITANT DIMEGLUMINE INJECTION 150 MG
150.0000 mg | Freq: Once | INTRAVENOUS | Status: AC
  Administered 2014-06-05: 150 mg via INTRAVENOUS
  Filled 2014-06-05: qty 5

## 2014-06-05 MED ORDER — LORAZEPAM 2 MG/ML IJ SOLN
0.5000 mg | Freq: Once | INTRAMUSCULAR | Status: AC
  Administered 2014-06-05: 0.5 mg via INTRAVENOUS

## 2014-06-05 MED ORDER — PALONOSETRON HCL INJECTION 0.25 MG/5ML
INTRAVENOUS | Status: AC
  Filled 2014-06-05: qty 5

## 2014-06-05 MED ORDER — LEUCOVORIN CALCIUM INJECTION 350 MG
400.0000 mg/m2 | Freq: Once | INTRAVENOUS | Status: AC
  Administered 2014-06-05: 584 mg via INTRAVENOUS
  Filled 2014-06-05: qty 29.2

## 2014-06-05 MED ORDER — LORAZEPAM 2 MG/ML IJ SOLN
INTRAMUSCULAR | Status: AC
  Filled 2014-06-05: qty 1

## 2014-06-05 MED ORDER — FLUOROURACIL CHEMO INJECTION 5 GM/100ML
2400.0000 mg/m2 | INTRAVENOUS | Status: DC
  Administered 2014-06-05: 3500 mg via INTRAVENOUS
  Filled 2014-06-05: qty 70

## 2014-06-05 NOTE — Telephone Encounter (Signed)
Gave avs & cal for Nov/Dec. Sent mess to sch tx °

## 2014-06-05 NOTE — Progress Notes (Addendum)
[facility] OFFICE PROGRESS NOTE   Diagnosis:  Gastroesophageal carcinoma  INTERVAL HISTORY:   Ms. Slight returns as scheduled. She has noted significant improvement in the nausea/vomiting since beginning Reglan. No further episodes of dysphagia since last week. She reports her weight is stable. Bowels moving regularly. No diarrhea.  Objective:  Vital signs in last 24 hours:  Blood pressure 110/78, pulse 75, temperature 97.4 F (36.3 C), temperature source Oral, resp. rate 18, height 5\' 1"  (1.549 m), weight 102 lb (46.267 kg), SpO2 99 %.    HEENT: no thrush or ulcers. Resp: lungs clear bilaterally. Cardio: regular rate and rhythm. GI: abdomen soft and nontender. No mass. No hepatomegaly. Vascular: no leg edema.  Port-A-Cath without erythema.    Lab Results:  Lab Results  Component Value Date   WBC 5.4 06/05/2014   HGB 11.2* 06/05/2014   HCT 33.9* 06/05/2014   MCV 86.3 06/05/2014   PLT 187 06/05/2014   NEUTROABS 3.5 06/05/2014    Imaging:  No results found.  Medications: I have reviewed the patient's current medications.  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   Cycle 1 CAPOX beginning 01/02/2014.   Cycle 2 CAPOX beginning 01/23/2014.   Cycle 3 CAPOX beginning 02/13/2014.   Restaging PET scan 02/27/2014 with mild hypermetabolism in the hilar regions with an SUV max on the right of 4.9. Previously seen mild hypermetabolism associated with the distal esophagus not readily identified. Mild uptake in the stomach without a focal lesion or uptake above blood pool.   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  Cycle 1 adjuvant FOLFOX 05/08/2014  Cycle 2 held 05/29/2014 due to neutropenia.  Cycle 2 adjuvant FOLFOX 06/05/2014 with Neulasta support. 2. History of gastroesophageal reflux disease and peptic stricture, status post an esophageal dilatation procedure in August 2012 3. History of solid dysphagia secondary to #1.  4. Anxiety. 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 6. Anorexia. Trial of Remeron initiated 05/08/2014. 7. Nausea and vomiting-persistent following FOLFOX 05/08/2014. Improved since beginning Reglan. 8. Neutropenia secondary to chemotherapy, cycle 2 FOLFOX held 05/29/2014.   Disposition: Ms. Fernandez appears stable. The nausea/vomiting has improved significantly since beginning Reglan. Plan to proceed with cycle 2 FOLFOX today as scheduled. She will return for a followup visit and cycle 3 in 2 weeks.  We are referring her for a barium swallow due to the episode of dysphagia last week.  Patient seen with Dr. Benay Spice.    Ned Card ANP/GNP-BC   06/05/2014  11:55 AM   This was a shared visit with Ned Card. Ms. Cala has experienced significant improvement in the nausea with Reglan. She reports an episode of solid dysphagia and would like this  evaluated. We will schedule a barium swallow. The plan is to continue adjuvant FOLFOX.  Julieanne Manson, M.D.

## 2014-06-05 NOTE — Patient Instructions (Signed)

## 2014-06-05 NOTE — Patient Instructions (Signed)
[facility] Discharge Instructions for Patients Receiving Chemotherapy  Today you received the following chemotherapy agents: Oxaliplatin, Leucovorin, and 5FU  To help prevent nausea and vomiting after your treatment, we encourage you to take your nausea medication: Zofran and Compazine 10mg  every 6 hours as needed.   If you develop nausea and vomiting that is not controlled by your nausea medication, call the clinic.   BELOW ARE SYMPTOMS THAT SHOULD BE REPORTED IMMEDIATELY:  *FEVER GREATER THAN 100.5 F  *CHILLS WITH OR WITHOUT FEVER  NAUSEA AND VOMITING THAT IS NOT CONTROLLED WITH YOUR NAUSEA MEDICATION  *UNUSUAL SHORTNESS OF BREATH  *UNUSUAL BRUISING OR BLEEDING  TENDERNESS IN MOUTH AND THROAT WITH OR WITHOUT PRESENCE OF ULCERS  *URINARY PROBLEMS  *BOWEL PROBLEMS  UNUSUAL RASH Items with * indicate a potential emergency and should be followed up as soon as possible.  Feel free to call the clinic you have any questions or concerns. The clinic phone number is [phone].   Leucovorin injection What is this medicine? LEUCOVORIN (loo koe VOR in) is used to prevent or treat the harmful effects of some medicines. This medicine is used to treat anemia caused by a low amount of folic acid in the body. It is also used with 5-fluorouracil (5-FU) to treat colon cancer. This medicine may be used for other purposes; ask your health care provider or pharmacist if you have questions. What should I tell my health care provider before I take this medicine? They need to know if you have any of these conditions: -anemia from low levels of vitamin B-12 in the blood -an unusual or allergic reaction to leucovorin, folic acid, other medicines, foods, dyes, or preservatives -pregnant or trying to get pregnant -breast-feeding How should I use this medicine? This medicine is for injection into a muscle or into a vein. It is given by a health care professional in a hospital or  clinic setting. Talk to your pediatrician regarding the use of this medicine in children. Special care may be needed. Overdosage: If you think you have taken too much of this medicine contact a poison control center or emergency room at once. NOTE: This medicine is only for you. Do not share this medicine with others. What if I miss a dose? This does not apply. What may interact with this medicine? -capecitabine -fluorouracil -phenobarbital -phenytoin -primidone -trimethoprim-sulfamethoxazole This list may not describe all possible interactions. Give your health care provider a list of all the medicines, herbs, non-prescription drugs, or dietary supplements you use. Also tell them if you smoke, drink alcohol, or use illegal drugs. Some items may interact with your medicine. What should I watch for while using this medicine? Your condition will be monitored carefully while you are receiving this medicine. This medicine may increase the side effects of 5-fluorouracil, 5-FU. Tell your doctor or health care professional if you have diarrhea or mouth sores that do not get better or that get worse. What side effects may I notice from receiving this medicine? Side effects that you should report to your doctor or health care professional as soon as possible: -allergic reactions like skin rash, itching or hives, swelling of the face, lips, or tongue -breathing problems -fever, infection -mouth sores -unusual bleeding or bruising -unusually weak or tired Side effects that usually do not require medical attention (report to your doctor or health care professional if they continue or are bothersome): -constipation or diarrhea -loss of appetite -nausea, vomiting This list may not describe all possible  side effects. Call your doctor for medical advice about side effects. You may report side effects to FDA at 1-800-FDA-1088. Where should I keep my medicine? This drug is given in a hospital or clinic  and will not be stored at home. NOTE: This sheet is a summary. It may not cover all possible information. If you have questions about this medicine, talk to your doctor, pharmacist, or health care provider.  2015, Elsevier/Gold Standard. (2008-01-11 16:50:29)    Fluorouracil, 5-FU injection What is this medicine? FLUOROURACIL, 5-FU (flure oh YOOR a sil) is a chemotherapy drug. It slows the growth of cancer cells. This medicine is used to treat many types of cancer like breast cancer, colon or rectal cancer, pancreatic cancer, and stomach cancer. This medicine may be used for other purposes; ask your health care provider or pharmacist if you have questions. COMMON BRAND NAME(S): Adrucil What should I tell my health care provider before I take this medicine? They need to know if you have any of these conditions: -blood disorders -dihydropyrimidine dehydrogenase (DPD) deficiency -infection (especially a virus infection such as chickenpox, cold sores, or herpes) -kidney disease -liver disease -malnourished, poor nutrition -recent or ongoing radiation therapy -an unusual or allergic reaction to fluorouracil, other chemotherapy, other medicines, foods, dyes, or preservatives -pregnant or trying to get pregnant -breast-feeding How should I use this medicine? This drug is given as an infusion or injection into a vein. It is administered in a hospital or clinic by a specially trained health care professional. Talk to your pediatrician regarding the use of this medicine in children. Special care may be needed. Overdosage: If you think you have taken too much of this medicine contact a poison control center or emergency room at once. NOTE: This medicine is only for you. Do not share this medicine with others. What if I miss a dose? It is important not to miss your dose. Call your doctor or health care professional if you are unable to keep an appointment. What may interact with this  medicine? -allopurinol -cimetidine -dapsone -digoxin -hydroxyurea -leucovorin -levamisole -medicines for seizures like ethotoin, fosphenytoin, phenytoin -medicines to increase blood counts like filgrastim, pegfilgrastim, sargramostim -medicines that treat or prevent blood clots like warfarin, enoxaparin, and dalteparin -methotrexate -metronidazole -pyrimethamine -some other chemotherapy drugs like busulfan, cisplatin, estramustine, vinblastine -trimethoprim -trimetrexate -vaccines Talk to your doctor or health care professional before taking any of these medicines: -acetaminophen -aspirin -ibuprofen -ketoprofen -naproxen This list may not describe all possible interactions. Give your health care provider a list of all the medicines, herbs, non-prescription drugs, or dietary supplements you use. Also tell them if you smoke, drink alcohol, or use illegal drugs. Some items may interact with your medicine. What should I watch for while using this medicine? Visit your doctor for checks on your progress. This drug may make you feel generally unwell. This is not uncommon, as chemotherapy can affect healthy cells as well as cancer cells. Report any side effects. Continue your course of treatment even though you feel ill unless your doctor tells you to stop. In some cases, you may be given additional medicines to help with side effects. Follow all directions for their use. Call your doctor or health care professional for advice if you get a fever, chills or sore throat, or other symptoms of a cold or flu. Do not treat yourself. This drug decreases your body's ability to fight infections. Try to avoid being around people who are sick. This medicine may increase your risk to  bruise or bleed. Call your doctor or health care professional if you notice any unusual bleeding. Be careful brushing and flossing your teeth or using a toothpick because you may get an infection or bleed more easily. If you  have any dental work done, tell your dentist you are receiving this medicine. Avoid taking products that contain aspirin, acetaminophen, ibuprofen, naproxen, or ketoprofen unless instructed by your doctor. These medicines may hide a fever. Do not become pregnant while taking this medicine. Women should inform their doctor if they wish to become pregnant or think they might be pregnant. There is a potential for serious side effects to an unborn child. Talk to your health care professional or pharmacist for more information. Do not breast-feed an infant while taking this medicine. Men should inform their doctor if they wish to father a child. This medicine may lower sperm counts. Do not treat diarrhea with over the counter products. Contact your doctor if you have diarrhea that lasts more than 2 days or if it is severe and watery. This medicine can make you more sensitive to the sun. Keep out of the sun. If you cannot avoid being in the sun, wear protective clothing and use sunscreen. Do not use sun lamps or tanning beds/booths. What side effects may I notice from receiving this medicine? Side effects that you should report to your doctor or health care professional as soon as possible: -allergic reactions like skin rash, itching or hives, swelling of the face, lips, or tongue -low blood counts - this medicine may decrease the number of white blood cells, red blood cells and platelets. You may be at increased risk for infections and bleeding. -signs of infection - fever or chills, cough, sore throat, pain or difficulty passing urine -signs of decreased platelets or bleeding - bruising, pinpoint red spots on the skin, black, tarry stools, blood in the urine -signs of decreased red blood cells - unusually weak or tired, fainting spells, lightheadedness -breathing problems -changes in vision -chest pain -mouth sores -nausea and vomiting -pain, swelling, redness at site where injected -pain, tingling,  numbness in the hands or feet -redness, swelling, or sores on hands or feet -stomach pain -unusual bleeding Side effects that usually do not require medical attention (report to your doctor or health care professional if they continue or are bothersome): -changes in finger or toe nails -diarrhea -dry or itchy skin -hair loss -headache -loss of appetite -sensitivity of eyes to the light -stomach upset -unusually teary eyes This list may not describe all possible side effects. Call your doctor for medical advice about side effects. You may report side effects to FDA at 1-800-FDA-1088. Where should I keep my medicine? This drug is given in a hospital or clinic and will not be stored at home. NOTE: This sheet is a summary. It may not cover all possible information. If you have questions about this medicine, talk to your doctor, pharmacist, or health care provider.  2015, Elsevier/Gold Standard. (2007-11-10 13:53:16)    Oxaliplatin Injection What is this medicine? OXALIPLATIN (ox AL i PLA tin) is a chemotherapy drug. It targets fast dividing cells, like cancer cells, and causes these cells to die. This medicine is used to treat cancers of the colon and rectum, and many other cancers. This medicine may be used for other purposes; ask your health care provider or pharmacist if you have questions. COMMON BRAND NAME(S): Eloxatin What should I tell my health care provider before I take this medicine? They need to  know if you have any of these conditions: -kidney disease -an unusual or allergic reaction to oxaliplatin, other chemotherapy, other medicines, foods, dyes, or preservatives -pregnant or trying to get pregnant -breast-feeding How should I use this medicine? This drug is given as an infusion into a vein. It is administered in a hospital or clinic by a specially trained health care professional. Talk to your pediatrician regarding the use of this medicine in children. Special care  may be needed. Overdosage: If you think you have taken too much of this medicine contact a poison control center or emergency room at once. NOTE: This medicine is only for you. Do not share this medicine with others. What if I miss a dose? It is important not to miss a dose. Call your doctor or health care professional if you are unable to keep an appointment. What may interact with this medicine? -medicines to increase blood counts like filgrastim, pegfilgrastim, sargramostim -probenecid -some antibiotics like amikacin, gentamicin, neomycin, polymyxin B, streptomycin, tobramycin -zalcitabine Talk to your doctor or health care professional before taking any of these medicines: -acetaminophen -aspirin -ibuprofen -ketoprofen -naproxen This list may not describe all possible interactions. Give your health care provider a list of all the medicines, herbs, non-prescription drugs, or dietary supplements you use. Also tell them if you smoke, drink alcohol, or use illegal drugs. Some items may interact with your medicine. What should I watch for while using this medicine? Your condition will be monitored carefully while you are receiving this medicine. You will need important blood work done while you are taking this medicine. This medicine can make you more sensitive to cold. Do not drink cold drinks or use ice. Cover exposed skin before coming in contact with cold temperatures or cold objects. When out in cold weather wear warm clothing and cover your mouth and nose to warm the air that goes into your lungs. Tell your doctor if you get sensitive to the cold. This drug may make you feel generally unwell. This is not uncommon, as chemotherapy can affect healthy cells as well as cancer cells. Report any side effects. Continue your course of treatment even though you feel ill unless your doctor tells you to stop. In some cases, you may be given additional medicines to help with side effects. Follow all  directions for their use. Call your doctor or health care professional for advice if you get a fever, chills or sore throat, or other symptoms of a cold or flu. Do not treat yourself. This drug decreases your body's ability to fight infections. Try to avoid being around people who are sick. This medicine may increase your risk to bruise or bleed. Call your doctor or health care professional if you notice any unusual bleeding. Be careful brushing and flossing your teeth or using a toothpick because you may get an infection or bleed more easily. If you have any dental work done, tell your dentist you are receiving this medicine. Avoid taking products that contain aspirin, acetaminophen, ibuprofen, naproxen, or ketoprofen unless instructed by your doctor. These medicines may hide a fever. Do not become pregnant while taking this medicine. Women should inform their doctor if they wish to become pregnant or think they might be pregnant. There is a potential for serious side effects to an unborn child. Talk to your health care professional or pharmacist for more information. Do not breast-feed an infant while taking this medicine. Call your doctor or health care professional if you get diarrhea. Do not treat  yourself. What side effects may I notice from receiving this medicine? Side effects that you should report to your doctor or health care professional as soon as possible: -allergic reactions like skin rash, itching or hives, swelling of the face, lips, or tongue -low blood counts - This drug may decrease the number of white blood cells, red blood cells and platelets. You may be at increased risk for infections and bleeding. -signs of infection - fever or chills, cough, sore throat, pain or difficulty passing urine -signs of decreased platelets or bleeding - bruising, pinpoint red spots on the skin, black, tarry stools, nosebleeds -signs of decreased red blood cells - unusually weak or tired, fainting  spells, lightheadedness -breathing problems -chest pain, pressure -cough -diarrhea -jaw tightness -mouth sores -nausea and vomiting -pain, swelling, redness or irritation at the injection site -pain, tingling, numbness in the hands or feet -problems with balance, talking, walking -redness, blistering, peeling or loosening of the skin, including inside the mouth -trouble passing urine or change in the amount of urine Side effects that usually do not require medical attention (report to your doctor or health care professional if they continue or are bothersome): -changes in vision -constipation -hair loss -loss of appetite -metallic taste in the mouth or changes in taste -stomach pain This list may not describe all possible side effects. Call your doctor for medical advice about side effects. You may report side effects to FDA at 1-800-FDA-1088. Where should I keep my medicine? This drug is given in a hospital or clinic and will not be stored at home. NOTE: This sheet is a summary. It may not cover all possible information. If you have questions about this medicine, talk to your doctor, pharmacist, or health care provider.  2015, [employer]/Gold Standard. (2008-02-01 17:22:47)

## 2014-06-05 NOTE — Progress Notes (Signed)
Patient reports she has not had any vomiting since starting Reglan last week.   Oral intake is slowly improving, but she continues to only consume one Ensure Plus daily.   She complains of increased satiety.   She verbalizes concern about potential blockage.  She is afraid to eat. Weight documented as 102 pounds.  Nutrition diagnosis: Unintended weight loss has improved.  Intervention: Educated patient to increase Ensure Plus 3 times a day with small amounts of food as tolerated. Provided support and encouragement for increasing oral intake. Questions answered.  Teach back method used.  Monitoring, evaluation, goals, patient will work to increase oral intake to promote weight gain.  Next visit: Monday, November 23.  **Disclaimer: This note was dictated with voice recognition software. Similar sounding words can inadvertently be transcribed and this note may contain transcription errors which may not have been corrected upon publication of note.**

## 2014-06-06 ENCOUNTER — Telehealth: Payer: Self-pay | Admitting: *Deleted

## 2014-06-06 ENCOUNTER — Telehealth: Payer: Self-pay | Admitting: Oncology

## 2014-06-06 NOTE — Telephone Encounter (Signed)
Mailed new cal with all appts for Nov/Dec

## 2014-06-06 NOTE — Telephone Encounter (Signed)
Per staff message and POF I have scheduled appts. Advised scheduler of appts. JMW  

## 2014-06-07 ENCOUNTER — Telehealth: Payer: Self-pay | Admitting: *Deleted

## 2014-06-07 ENCOUNTER — Ambulatory Visit: Payer: Medicare Other

## 2014-06-07 ENCOUNTER — Ambulatory Visit (HOSPITAL_BASED_OUTPATIENT_CLINIC_OR_DEPARTMENT_OTHER): Payer: Medicare Other

## 2014-06-07 DIAGNOSIS — C169 Malignant neoplasm of stomach, unspecified: Secondary | ICD-10-CM

## 2014-06-07 DIAGNOSIS — C160 Malignant neoplasm of cardia: Secondary | ICD-10-CM

## 2014-06-07 DIAGNOSIS — Z5189 Encounter for other specified aftercare: Secondary | ICD-10-CM

## 2014-06-07 MED ORDER — SODIUM CHLORIDE 0.9 % IJ SOLN
10.0000 mL | INTRAMUSCULAR | Status: DC | PRN
  Administered 2014-06-07: 10 mL
  Filled 2014-06-07: qty 10

## 2014-06-07 MED ORDER — HEPARIN SOD (PORK) LOCK FLUSH 100 UNIT/ML IV SOLN
500.0000 [IU] | Freq: Once | INTRAVENOUS | Status: AC | PRN
  Administered 2014-06-07: 500 [IU]
  Filled 2014-06-07: qty 5

## 2014-06-07 MED ORDER — PEGFILGRASTIM INJECTION 6 MG/0.6ML ~~LOC~~
6.0000 mg | PREFILLED_SYRINGE | Freq: Once | SUBCUTANEOUS | Status: AC
  Administered 2014-06-07: 6 mg via SUBCUTANEOUS
  Filled 2014-06-07: qty 0.6

## 2014-06-07 NOTE — Progress Notes (Signed)
neulasta given by flush nurse 

## 2014-06-07 NOTE — Patient Instructions (Signed)
Pegfilgrastim injection What is this medicine? PEGFILGRASTIM (peg fil GRA stim) is a long-acting granulocyte colony-stimulating factor that stimulates the growth of neutrophils, a type of white blood cell important in the body's fight against infection. It is used to reduce the incidence of fever and infection in patients with certain types of cancer who are receiving chemotherapy that affects the bone marrow. This medicine may be used for other purposes; ask your health care provider or pharmacist if you have questions. COMMON BRAND NAME(S): Neulasta What should I tell my health care provider before I take this medicine? They need to know if you have any of these conditions: -latex allergy -ongoing radiation therapy -sickle cell disease -skin reactions to acrylic adhesives (On-Body Injector only) -an unusual or allergic reaction to pegfilgrastim, filgrastim, other medicines, foods, dyes, or preservatives -pregnant or trying to get pregnant -breast-feeding How should I use this medicine? This medicine is for injection under the skin. If you get this medicine at home, you will be taught how to prepare and give the pre-filled syringe or how to use the On-body Injector. Refer to the patient Instructions for Use for detailed instructions. Use exactly as directed. Take your medicine at regular intervals. Do not take your medicine more often than directed. It is important that you put your used needles and syringes in a special sharps container. Do not put them in a trash can. If you do not have a sharps container, call your pharmacist or healthcare provider to get one. Talk to your pediatrician regarding the use of this medicine in children. Special care may be needed. Overdosage: If you think you have taken too much of this medicine contact a poison control center or emergency room at once. NOTE: This medicine is only for you. Do not share this medicine with others. What if I miss a dose? It is  important not to miss your dose. Call your doctor or health care professional if you miss your dose. If you miss a dose due to an On-body Injector failure or leakage, a new dose should be administered as soon as possible using a single prefilled syringe for manual use. What may interact with this medicine? Interactions have not been studied. Give your health care provider a list of all the medicines, herbs, non-prescription drugs, or dietary supplements you use. Also tell them if you smoke, drink alcohol, or use illegal drugs. Some items may interact with your medicine. This list may not describe all possible interactions. Give your health care provider a list of all the medicines, herbs, non-prescription drugs, or dietary supplements you use. Also tell them if you smoke, drink alcohol, or use illegal drugs. Some items may interact with your medicine. What should I watch for while using this medicine? You may need blood work done while you are taking this medicine. If you are going to need a MRI, CT scan, or other procedure, tell your doctor that you are using this medicine (On-Body Injector only). What side effects may I notice from receiving this medicine? Side effects that you should report to your doctor or health care professional as soon as possible: -allergic reactions like skin rash, itching or hives, swelling of the face, lips, or tongue -dizziness -fever -pain, redness, or irritation at site where injected -pinpoint red spots on the skin -shortness of breath or breathing problems -stomach or side pain, or pain at the shoulder -swelling -tiredness -trouble passing urine Side effects that usually do not require medical attention (report to your doctor   or health care professional if they continue or are bothersome): -bone pain -muscle pain This list may not describe all possible side effects. Call your doctor for medical advice about side effects. You may report side effects to FDA at  1-800-FDA-1088. Where should I keep my medicine? Keep out of the reach of children. Store pre-filled syringes in a refrigerator between 2 and 8 degrees C (36 and 46 degrees F). Do not freeze. Keep in carton to protect from light. Throw away this medicine if it is left out of the refrigerator for more than 48 hours. Throw away any unused medicine after the expiration date. NOTE: This sheet is a summary. It may not cover all possible information. If you have questions about this medicine, talk to your doctor, pharmacist, or health care provider.  2015, Elsevier/Gold Standard. (2013-10-06 16:14:05)  

## 2014-06-07 NOTE — Telephone Encounter (Signed)
Patient in for Infusion pump disconnect and neulasta injection. Infusion pump applied on 06/05/14. Patient denies any pain or discomfort at this time. Patient states, "I just feel a little tired. Other than that I'm fine." Patient also denies any nausea, vomiting, numbness or tingling in extremities. Patient reports that she has not noticed any changes with her appetite or bowel movements. Patient made aware of neulasta side effects and printed material given to Patient. All questions answer about the Neulasta injection. Encouraged Patient to call the [place] if she has any further questions or concerns. Patient verbalized understanding. Neulasta injection given as ordered. Patient tolerated injection well.

## 2014-06-08 ENCOUNTER — Telehealth: Payer: Self-pay | Admitting: *Deleted

## 2014-06-08 ENCOUNTER — Other Ambulatory Visit: Payer: Self-pay | Admitting: *Deleted

## 2014-06-08 NOTE — Telephone Encounter (Signed)
Pt called reports "I had my treatment this week and yesterday had my pump d/c and got the Neulasta shot; now I'm having diarrhea"  Pt states she has had 6 episodes today; not eating very much but "trying to stay hydrated;" denies fever/pain/mouth sores.  Pt is taking Imodium and asking "is this from the shot?"  Per Dr. Benay Spice; explained to pt that this in not from the shot but from the chemo; continue to take the Imodium after each loose stool.  Offered pt Lomotil; would have to pick-up script; pt declined.  Push po fluids and call in the morning if not better.  Pt verbalized understanding of information.

## 2014-06-09 ENCOUNTER — Telehealth: Payer: Self-pay | Admitting: Oncology

## 2014-06-09 ENCOUNTER — Ambulatory Visit (HOSPITAL_BASED_OUTPATIENT_CLINIC_OR_DEPARTMENT_OTHER): Payer: Medicare Other

## 2014-06-09 ENCOUNTER — Other Ambulatory Visit: Payer: Self-pay | Admitting: *Deleted

## 2014-06-09 ENCOUNTER — Telehealth: Payer: Self-pay | Admitting: *Deleted

## 2014-06-09 VITALS — BP 107/69 | HR 89 | Temp 98.3°F | Resp 20

## 2014-06-09 DIAGNOSIS — E860 Dehydration: Secondary | ICD-10-CM

## 2014-06-09 DIAGNOSIS — C169 Malignant neoplasm of stomach, unspecified: Secondary | ICD-10-CM

## 2014-06-09 MED ORDER — SODIUM CHLORIDE 0.9 % IV SOLN
Freq: Once | INTRAVENOUS | Status: AC
  Administered 2014-06-09: 15:00:00 via INTRAVENOUS

## 2014-06-09 NOTE — Telephone Encounter (Signed)
added ivf for today .Marland Kitchen..pt aware per pof

## 2014-06-09 NOTE — Patient Instructions (Signed)
Dehydration, Adult Dehydration is when you lose more fluids from the body than you take in. Vital organs like the kidneys, brain, and heart cannot function without a proper amount of fluids and salt. Any loss of fluids from the body can cause dehydration.  CAUSES   Vomiting.  Diarrhea.  Excessive sweating.  Excessive urine output.  Fever. SYMPTOMS  Mild dehydration  Thirst.  Dry lips.  Slightly dry mouth. Moderate dehydration  Very dry mouth.  Sunken eyes.  Skin does not bounce back quickly when lightly pinched and released.  Dark urine and decreased urine production.  Decreased tear production.  Headache. Severe dehydration  Very dry mouth.  Extreme thirst.  Rapid, weak pulse (more than 100 beats per minute at rest).  Cold hands and feet.  Not able to sweat in spite of heat and temperature.  Rapid breathing.  Blue lips.  Confusion and lethargy.  Difficulty being awakened.  Minimal urine production.  No tears. DIAGNOSIS  Your caregiver will diagnose dehydration based on your symptoms and your exam. Blood and urine tests will help confirm the diagnosis. The diagnostic evaluation should also identify the cause of dehydration. TREATMENT  Treatment of mild or moderate dehydration can often be done at home by increasing the amount of fluids that you drink. It is best to drink small amounts of fluid more often. Drinking too much at one time can make vomiting worse. Refer to the home care instructions below. Severe dehydration needs to be treated at the hospital where you will probably be given intravenous (IV) fluids that contain water and electrolytes. HOME CARE INSTRUCTIONS   Ask your caregiver about specific rehydration instructions.  Drink enough fluids to keep your urine clear or pale yellow.  Drink small amounts frequently if you have nausea and vomiting.  Eat as you normally do.  Avoid:  Foods or drinks high in sugar.  Carbonated  drinks.  Juice.  Extremely hot or cold fluids.  Drinks with caffeine.  Fatty, greasy foods.  Alcohol.  Tobacco.  Overeating.  Gelatin desserts.  Wash your hands well to avoid spreading bacteria and viruses.  Only take over-the-counter or prescription medicines for pain, discomfort, or fever as directed by your caregiver.  Ask your caregiver if you should continue all prescribed and over-the-counter medicines.  Keep all follow-up appointments with your caregiver. SEEK MEDICAL CARE IF:  You have abdominal pain and it increases or stays in one area (localizes).  You have a rash, stiff neck, or severe headache.  You are irritable, sleepy, or difficult to awaken.  You are weak, dizzy, or extremely thirsty. SEEK IMMEDIATE MEDICAL CARE IF:   You are unable to keep fluids down or you get worse despite treatment.  You have frequent episodes of vomiting or diarrhea.  You have blood or green matter (bile) in your vomit.  You have blood in your stool or your stool looks black and tarry.  You have not urinated in 6 to 8 hours, or you have only urinated a small amount of very dark urine.  You have a fever.  You faint. MAKE SURE YOU:   Understand these instructions.  Will watch your condition.  Will get help right away if you are not doing well or get worse. Document Released: 07/07/2005 Document Revised: 09/29/2011 Document Reviewed: 02/24/2011 Exit[employer] Patient Information 2015 [employer]. This information is not intended to replace advice given to you by your health care provider. Make sure you discuss any questions you have with your health care   provider.  

## 2014-06-09 NOTE — Telephone Encounter (Signed)
Pt called states " I feel like I'm getting dehydrated; I would like to come in for fluids today"  Pt reports she has had NO diarrhea today; has been able to drink 3 Ensure drinks and some water but request fluids.  Per Dr. Benay Spice; notified pt that we can get her in today @ 1430 for fluids.  Pt verbalized understanding and confirmed she will be here.

## 2014-06-12 ENCOUNTER — Encounter: Payer: Medicare Other | Admitting: Nutrition

## 2014-06-12 ENCOUNTER — Ambulatory Visit: Payer: Medicare Other | Admitting: Nurse Practitioner

## 2014-06-12 ENCOUNTER — Inpatient Hospital Stay: Payer: Medicare Other

## 2014-06-12 ENCOUNTER — Other Ambulatory Visit: Payer: Medicare Other

## 2014-06-13 ENCOUNTER — Telehealth: Payer: Self-pay | Admitting: *Deleted

## 2014-06-13 ENCOUNTER — Other Ambulatory Visit: Payer: Self-pay | Admitting: *Deleted

## 2014-06-13 ENCOUNTER — Telehealth: Payer: Self-pay | Admitting: Oncology

## 2014-06-13 DIAGNOSIS — C169 Malignant neoplasm of stomach, unspecified: Secondary | ICD-10-CM

## 2014-06-13 NOTE — Telephone Encounter (Signed)
S/w pt confirming IVF added for 11/25 per 11/24 POF.... Cherylann Banas

## 2014-06-13 NOTE — Telephone Encounter (Signed)
Per staff message and POF I have scheduled appts. Advised scheduler of appts. JMW  

## 2014-06-13 NOTE — Telephone Encounter (Signed)
Per Dr. Benay Spice; notified pt that she is scheduled for IVF @ 13:15 tomorrow 11/25 and re-iterated that MD does not recommend pt taking Creon for appetite.  Pt verbalized understanding and states she will be here tomorrow.

## 2014-06-13 NOTE — Telephone Encounter (Signed)
Pt called "I would like to be set up for fluids tomorrow if possible"  Pt reports she has had X2 episodes of diarrhea "so far" today and has taken Imodium; feels weak/dehydrated; not eating well but is able to drink Ensure.  Denies fever/vomiting--"would like to feel better for the holidays"  Pt also requesting (from earlier message) script for Creon "my friend has this and she said it helps with appetite.  Per Dr. 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  Note to Dr. Benay Spice.

## 2014-06-14 ENCOUNTER — Ambulatory Visit (HOSPITAL_BASED_OUTPATIENT_CLINIC_OR_DEPARTMENT_OTHER): Payer: Medicare Other

## 2014-06-14 VITALS — BP 110/69 | HR 83 | Temp 98.2°F | Resp 18

## 2014-06-14 DIAGNOSIS — C169 Malignant neoplasm of stomach, unspecified: Secondary | ICD-10-CM

## 2014-06-14 DIAGNOSIS — E860 Dehydration: Secondary | ICD-10-CM

## 2014-06-14 MED ORDER — SODIUM CHLORIDE 0.9 % IV SOLN
1000.0000 mL | Freq: Once | INTRAVENOUS | Status: AC
  Administered 2014-06-14: 1000 mL via INTRAVENOUS

## 2014-06-14 MED ORDER — SODIUM CHLORIDE 0.9 % IJ SOLN
10.0000 mL | Freq: Once | INTRAMUSCULAR | Status: AC
  Administered 2014-06-14: 10 mL
  Filled 2014-06-14: qty 10

## 2014-06-14 MED ORDER — HEPARIN SOD (PORK) LOCK FLUSH 100 UNIT/ML IV SOLN
500.0000 [IU] | Freq: Once | INTRAVENOUS | Status: AC
  Administered 2014-06-14: 500 [IU]
  Filled 2014-06-14: qty 5

## 2014-06-14 NOTE — Patient Instructions (Signed)
Dehydration, Adult Dehydration is when you lose more fluids from the body than you take in. Vital organs like the kidneys, brain, and heart cannot function without a proper amount of fluids and salt. Any loss of fluids from the body can cause dehydration.  CAUSES   Vomiting.  Diarrhea.  Excessive sweating.  Excessive urine output.  Fever. SYMPTOMS  Mild dehydration  Thirst.  Dry lips.  Slightly dry mouth. Moderate dehydration  Very dry mouth.  Sunken eyes.  Skin does not bounce back quickly when lightly pinched and released.  Dark urine and decreased urine production.  Decreased tear production.  Headache. Severe dehydration  Very dry mouth.  Extreme thirst.  Rapid, weak pulse (more than 100 beats per minute at rest).  Cold hands and feet.  Not able to sweat in spite of heat and temperature.  Rapid breathing.  Blue lips.  Confusion and lethargy.  Difficulty being awakened.  Minimal urine production.  No tears. DIAGNOSIS  Your caregiver will diagnose dehydration based on your symptoms and your exam. Blood and urine tests will help confirm the diagnosis. The diagnostic evaluation should also identify the cause of dehydration. TREATMENT  Treatment of mild or moderate dehydration can often be done at home by increasing the amount of fluids that you drink. It is best to drink small amounts of fluid more often. Drinking too much at one time can make vomiting worse. Refer to the home care instructions below. Severe dehydration needs to be treated at the hospital where you will probably be given intravenous (IV) fluids that contain water and electrolytes. HOME CARE INSTRUCTIONS   Ask your caregiver about specific rehydration instructions.  Drink enough fluids to keep your urine clear or pale yellow.  Drink small amounts frequently if you have nausea and vomiting.  Eat as you normally do.  Avoid:  Foods or drinks high in sugar.  Carbonated  drinks.  Juice.  Extremely hot or cold fluids.  Drinks with caffeine.  Fatty, greasy foods.  Alcohol.  Tobacco.  Overeating.  Gelatin desserts.  Wash your hands well to avoid spreading bacteria and viruses.  Only take over-the-counter or prescription medicines for pain, discomfort, or fever as directed by your caregiver.  Ask your caregiver if you should continue all prescribed and over-the-counter medicines.  Keep all follow-up appointments with your caregiver. SEEK MEDICAL CARE IF:  You have abdominal pain and it increases or stays in one area (localizes).  You have a rash, stiff neck, or severe headache.  You are irritable, sleepy, or difficult to awaken.  You are weak, dizzy, or extremely thirsty. SEEK IMMEDIATE MEDICAL CARE IF:   You are unable to keep fluids down or you get worse despite treatment.  You have frequent episodes of vomiting or diarrhea.  You have blood or green matter (bile) in your vomit.  You have blood in your stool or your stool looks black and tarry.  You have not urinated in 6 to 8 hours, or you have only urinated a small amount of very dark urine.  You have a fever.  You faint. MAKE SURE YOU:   Understand these instructions.  Will watch your condition.  Will get help right away if you are not doing well or get worse. Document Released: 07/07/2005 Document Revised: 09/29/2011 Document Reviewed: 02/24/2011 Exit[employer] Patient Information 2015 [employer]. This information is not intended to replace advice given to you by your health care provider. Make sure you discuss any questions you have with your health care   provider.  

## 2014-06-16 ENCOUNTER — Other Ambulatory Visit: Payer: Self-pay | Admitting: Oncology

## 2014-06-19 ENCOUNTER — Ambulatory Visit: Payer: Medicare Other | Admitting: Nutrition

## 2014-06-19 ENCOUNTER — Ambulatory Visit: Payer: Medicare Other

## 2014-06-19 ENCOUNTER — Ambulatory Visit (HOSPITAL_BASED_OUTPATIENT_CLINIC_OR_DEPARTMENT_OTHER): Payer: Medicare Other

## 2014-06-19 ENCOUNTER — Telehealth: Payer: Self-pay | Admitting: *Deleted

## 2014-06-19 ENCOUNTER — Ambulatory Visit (HOSPITAL_BASED_OUTPATIENT_CLINIC_OR_DEPARTMENT_OTHER): Payer: Medicare Other | Admitting: Oncology

## 2014-06-19 ENCOUNTER — Other Ambulatory Visit (HOSPITAL_BASED_OUTPATIENT_CLINIC_OR_DEPARTMENT_OTHER): Payer: Medicare Other

## 2014-06-19 VITALS — BP 117/79 | HR 93 | Temp 97.5°F | Resp 18 | Ht 61.0 in | Wt 95.3 lb

## 2014-06-19 DIAGNOSIS — C169 Malignant neoplasm of stomach, unspecified: Secondary | ICD-10-CM

## 2014-06-19 DIAGNOSIS — Z95828 Presence of other vascular implants and grafts: Secondary | ICD-10-CM

## 2014-06-19 DIAGNOSIS — Z5111 Encounter for antineoplastic chemotherapy: Secondary | ICD-10-CM

## 2014-06-19 DIAGNOSIS — R634 Abnormal weight loss: Secondary | ICD-10-CM

## 2014-06-19 DIAGNOSIS — D702 Other drug-induced agranulocytosis: Secondary | ICD-10-CM

## 2014-06-19 DIAGNOSIS — R630 Anorexia: Secondary | ICD-10-CM

## 2014-06-19 DIAGNOSIS — R197 Diarrhea, unspecified: Secondary | ICD-10-CM

## 2014-06-19 LAB — COMPREHENSIVE METABOLIC PANEL (CC13)
ALK PHOS: 58 U/L (ref 40–150)
ALT: 13 U/L (ref 0–55)
ANION GAP: 9 meq/L (ref 3–11)
AST: 17 U/L (ref 5–34)
Albumin: 3.2 g/dL — ABNORMAL LOW (ref 3.5–5.0)
BILIRUBIN TOTAL: 0.48 mg/dL (ref 0.20–1.20)
BUN: 17.4 mg/dL (ref 7.0–26.0)
CO2: 24 meq/L (ref 22–29)
CREATININE: 0.8 mg/dL (ref 0.6–1.1)
Calcium: 9.2 mg/dL (ref 8.4–10.4)
Chloride: 109 mEq/L (ref 98–109)
GLUCOSE: 140 mg/dL (ref 70–140)
Potassium: 3.5 mEq/L (ref 3.5–5.1)
Sodium: 142 mEq/L (ref 136–145)
TOTAL PROTEIN: 6.1 g/dL — AB (ref 6.4–8.3)

## 2014-06-19 LAB — CBC WITH DIFFERENTIAL/PLATELET
BASO%: 0.4 % (ref 0.0–2.0)
Basophils Absolute: 0 10*3/uL (ref 0.0–0.1)
EOS ABS: 0 10*3/uL (ref 0.0–0.5)
EOS%: 0.2 % (ref 0.0–7.0)
HEMATOCRIT: 36.6 % (ref 34.8–46.6)
HGB: 11.7 g/dL (ref 11.6–15.9)
LYMPH%: 12.7 % — AB (ref 14.0–49.7)
MCH: 28 pg (ref 25.1–34.0)
MCHC: 32.1 g/dL (ref 31.5–36.0)
MCV: 87.2 fL (ref 79.5–101.0)
MONO#: 0.7 10*3/uL (ref 0.1–0.9)
MONO%: 8.2 % (ref 0.0–14.0)
NEUT%: 78.5 % — AB (ref 38.4–76.8)
NEUTROS ABS: 6.9 10*3/uL — AB (ref 1.5–6.5)
PLATELETS: 217 10*3/uL (ref 145–400)
RBC: 4.19 10*6/uL (ref 3.70–5.45)
RDW: 16.7 % — ABNORMAL HIGH (ref 11.2–14.5)
WBC: 8.8 10*3/uL (ref 3.9–10.3)
lymph#: 1.1 10*3/uL (ref 0.9–3.3)

## 2014-06-19 MED ORDER — DEXTROSE 5 % IV SOLN
Freq: Once | INTRAVENOUS | Status: AC
  Administered 2014-06-19: 12:00:00 via INTRAVENOUS

## 2014-06-19 MED ORDER — PALONOSETRON HCL INJECTION 0.25 MG/5ML
0.2500 mg | Freq: Once | INTRAVENOUS | Status: AC
  Administered 2014-06-19: 0.25 mg via INTRAVENOUS

## 2014-06-19 MED ORDER — DIPHENOXYLATE-ATROPINE 2.5-0.025 MG PO TABS
ORAL_TABLET | ORAL | Status: AC
Start: 2014-06-19 — End: 2014-06-19
  Filled 2014-06-19: qty 2

## 2014-06-19 MED ORDER — SODIUM CHLORIDE 0.9 % IJ SOLN
10.0000 mL | INTRAMUSCULAR | Status: DC | PRN
  Administered 2014-06-19: 10 mL via INTRAVENOUS
  Filled 2014-06-19: qty 10

## 2014-06-19 MED ORDER — DEXTROSE 5 % IV SOLN
400.0000 mg/m2 | Freq: Once | INTRAVENOUS | Status: AC
  Administered 2014-06-19: 584 mg via INTRAVENOUS
  Filled 2014-06-19: qty 29.2

## 2014-06-19 MED ORDER — DEXAMETHASONE SODIUM PHOSPHATE 20 MG/5ML IJ SOLN
INTRAMUSCULAR | Status: AC
  Filled 2014-06-19: qty 5

## 2014-06-19 MED ORDER — DIPHENOXYLATE-ATROPINE 2.5-0.025 MG PO TABS
2.0000 | ORAL_TABLET | Freq: Once | ORAL | Status: AC
  Administered 2014-06-19: 2 via ORAL

## 2014-06-19 MED ORDER — SODIUM CHLORIDE 0.9 % IV SOLN
150.0000 mg | Freq: Once | INTRAVENOUS | Status: AC
  Administered 2014-06-19: 150 mg via INTRAVENOUS
  Filled 2014-06-19: qty 5

## 2014-06-19 MED ORDER — MIRTAZAPINE 30 MG PO TABS: 30.0000 mg | ORAL_TABLET | Freq: Every day | ORAL | Status: DC

## 2014-06-19 MED ORDER — DIPHENOXYLATE-ATROPINE 2.5-0.025 MG PO TABS: 1.0000 | ORAL_TABLET | ORAL | Status: DC | PRN

## 2014-06-19 MED ORDER — SODIUM CHLORIDE 0.9 % IV SOLN
2400.0000 mg/m2 | INTRAVENOUS | Status: AC
  Administered 2014-06-19: 3500 mg via INTRAVENOUS
  Filled 2014-06-19: qty 70

## 2014-06-19 MED ORDER — DEXAMETHASONE SODIUM PHOSPHATE 10 MG/ML IJ SOLN
10.0000 mg | Freq: Once | INTRAMUSCULAR | Status: AC
  Administered 2014-06-19: 10 mg via INTRAVENOUS

## 2014-06-19 MED ORDER — MEGESTROL ACETATE 40 MG/ML PO SUSP: 200.0000 mg | Freq: Two times a day (BID) | ORAL | Status: DC

## 2014-06-19 MED ORDER — FLUOROURACIL CHEMO INJECTION 2.5 GM/50ML
400.0000 mg/m2 | Freq: Once | INTRAVENOUS | Status: AC
  Administered 2014-06-19: 600 mg via INTRAVENOUS
  Filled 2014-06-19: qty 12

## 2014-06-19 MED ORDER — DEXAMETHASONE SODIUM PHOSPHATE 10 MG/ML IJ SOLN
INTRAMUSCULAR | Status: AC
  Filled 2014-06-19: qty 1

## 2014-06-19 MED ORDER — PALONOSETRON HCL INJECTION 0.25 MG/5ML
INTRAVENOUS | Status: AC
  Filled 2014-06-19: qty 5

## 2014-06-19 MED ORDER — OXALIPLATIN CHEMO INJECTION 100 MG/20ML
85.0000 mg/m2 | Freq: Once | INTRAVENOUS | Status: AC
  Administered 2014-06-19: 125 mg via INTRAVENOUS
  Filled 2014-06-19: qty 25

## 2014-06-19 NOTE — Patient Instructions (Signed)

## 2014-06-19 NOTE — Progress Notes (Signed)
After patient went to infusion area and was talking with dietician, she is requesting steroid to stimulate her appetite. Dr. Benay Spice had increased her Remeron to 30 mg for this purpose. OK to try Megace suspension if she wishes-he prefers this to steroids. Notified patient

## 2014-06-19 NOTE — Telephone Encounter (Signed)
Per staff message and POF I have scheduled appts. Advised scheduler of appts. JMW  

## 2014-06-19 NOTE — Progress Notes (Signed)
[place] OFFICE PROGRESS NOTE   Diagnosis: Gastric cancer  INTERVAL HISTORY:   Lynn Morris returns as scheduled. She completed a cycle of FOLFOX beginning 06/05/2014. She continues to have frequent belching. The nausea improved when she began Reglan. She reports diarrhea on day for that improved after taking Imodium. She had recurrent diarrhea last week. Her appetite remains poor. No neuropathy symptoms. She has received intermittent IV fluids since completing the most recent cycle of chemotherapy.   Objective:  Vital signs in last 24 hours:  Blood pressure 117/79, pulse 93, temperature 97.5 F (36.4 C), temperature source Oral, resp. rate 18, height 5\' 1"  (1.549 m), weight 95 lb 4.8 oz (43.228 kg), SpO2 97 %.    HEENT: No thrush or ulcers Resp: Lungs clear bilaterally Cardio: Regular rate and rhythm GI: No hepatomegaly, nontender, no mass Vascular: No leg edema   Portacath/PICC-without erythema  Lab Results:  Lab Results  Component Value Date   WBC 8.8 06/19/2014   HGB 11.7 06/19/2014   HCT 36.6 06/19/2014   MCV 87.2 06/19/2014   PLT 217 06/19/2014   NEUTROABS 6.9* 06/19/2014     Medications: I have reviewed the patient's current medications.  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   Cycle 1 CAPOX beginning 01/02/2014.   Cycle 2 CAPOX beginning 01/23/2014.   Cycle 3 CAPOX beginning 02/13/2014.   Restaging PET scan 02/27/2014 with mild hypermetabolism in the hilar regions with an SUV max on the right of 4.9. Previously seen mild hypermetabolism associated with the distal esophagus not readily identified. Mild uptake in the stomach without a focal lesion or uptake above blood pool.   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  Cycle 1 adjuvant FOLFOX 05/08/2014  Cycle 2 held 05/29/2014 due to neutropenia.  Cycle 2 adjuvant FOLFOX 06/05/2014 with Neulasta support. 2. History of gastroesophageal reflux disease and peptic stricture, status post an esophageal dilatation procedure in August 2012 3. History of solid dysphagia secondary to #1.  4. Anxiety. 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 6. Anorexia. Trial of Remeron initiated 05/08/2014. 7. Nausea and vomiting-persistent following FOLFOX 05/08/2014. Improved since beginning Reglan. 8. Neutropenia secondary to chemotherapy, cycle 2 FOLFOX held 05/29/2014. 9. Diarrhea-potentially related to 5-fluorouracil, Reglan, nutrition supplements, and the post gastrectomy state   Disposition:  Her overall status appears unchanged, but she continues to have anorexia and weight loss. She will begin a trial of Megace. She will meet with the Cancer center nutritionist. [name] will receive IV fluids on the day of the pump disconnect this week. She will return for an office visit and schedule chemotherapy in 2 weeks.  She will try Lomotil if the Imodium does not  help the diarrhea. I think it is unlikely the intermittent diarrhea is related  to Reglan.  Betsy Coder, MD  06/19/2014  5:33 PM

## 2014-06-19 NOTE — Progress Notes (Signed)
Patient has had continued weight loss.   Weight documented as 95.3 pounds down from 102 pounds. Albumin noted at 3.2. Patient reports she has diarrhea on and off.   Patient continues to take Reglan, which has improved nausea. Patient continues to eat very small amounts of food.  She is trying to drink Ensure Plus or drink ENU protein drink 2-3 times daily. Patient requesting assistance with ordering ENU protein shake.  Nutrition diagnosis: Unintended weight loss continues.  Intervention: Assisted patient with ordering protein shake.  Patient encouraged to consume ENU protein shake 3 times a day. Additional samples provided. Patient should continue to consume small amounts of food at mealtimes. Questions were answered.  Teach back method used.  Monitoring, evaluation, goals: Patient will tolerate ENU protein drinks 3 times daily.  Next visit:Tuesday, December 15, during chemotherapy.  **Disclaimer: This note was dictated with voice recognition software. Similar sounding words can inadvertently be transcribed and this note may contain transcription errors which may not have been corrected upon publication of note.**

## 2014-06-19 NOTE — Patient Instructions (Signed)
[facility] Discharge Instructions for Patients Receiving Chemotherapy  Today you received the following chemotherapy agents: Oxaliplatin, Leucovorin, 5-FU.  To help prevent nausea and vomiting after your treatment, we encourage you to take your nausea medication.    If you develop nausea and vomiting that is not controlled by your nausea medication, call the clinic.   BELOW ARE SYMPTOMS THAT SHOULD BE REPORTED IMMEDIATELY:  *FEVER GREATER THAN 100.5 F  *CHILLS WITH OR WITHOUT FEVER  NAUSEA AND VOMITING THAT IS NOT CONTROLLED WITH YOUR NAUSEA MEDICATION  *UNUSUAL SHORTNESS OF BREATH  *UNUSUAL BRUISING OR BLEEDING  TENDERNESS IN MOUTH AND THROAT WITH OR WITHOUT PRESENCE OF ULCERS  *URINARY PROBLEMS  *BOWEL PROBLEMS  UNUSUAL RASH Items with * indicate a potential emergency and should be followed up as soon as possible.  Feel free to call the clinic you have any questions or concerns. The clinic phone number is [phone].    

## 2014-06-20 ENCOUNTER — Ambulatory Visit (HOSPITAL_COMMUNITY): Payer: Medicare Other

## 2014-06-21 ENCOUNTER — Ambulatory Visit (HOSPITAL_BASED_OUTPATIENT_CLINIC_OR_DEPARTMENT_OTHER): Payer: Medicare Other

## 2014-06-21 ENCOUNTER — Other Ambulatory Visit: Payer: Self-pay | Admitting: *Deleted

## 2014-06-21 ENCOUNTER — Ambulatory Visit: Payer: Medicare Other

## 2014-06-21 ENCOUNTER — Other Ambulatory Visit: Payer: Self-pay

## 2014-06-21 ENCOUNTER — Ambulatory Visit (HOSPITAL_COMMUNITY)
Admission: RE | Admit: 2014-06-21 | Discharge: 2014-06-21 | Disposition: A | Discharge status: Home / Self Care | Payer: Medicare Other | Source: Ambulatory Visit | Attending: Nurse Practitioner | Admitting: Nurse Practitioner

## 2014-06-21 ENCOUNTER — Ambulatory Visit (HOSPITAL_COMMUNITY): Payer: Medicare Other

## 2014-06-21 VITALS — BP 105/72 | HR 82 | Temp 98.3°F | Resp 16

## 2014-06-21 DIAGNOSIS — C169 Malignant neoplasm of stomach, unspecified: Secondary | ICD-10-CM | POA: Diagnosis not present

## 2014-06-21 DIAGNOSIS — D702 Other drug-induced agranulocytosis: Secondary | ICD-10-CM

## 2014-06-21 DIAGNOSIS — Z931 Gastrostomy status: Secondary | ICD-10-CM | POA: Insufficient documentation

## 2014-06-21 DIAGNOSIS — R197 Diarrhea, unspecified: Secondary | ICD-10-CM

## 2014-06-21 DIAGNOSIS — E860 Dehydration: Secondary | ICD-10-CM

## 2014-06-21 DIAGNOSIS — R1310 Dysphagia, unspecified: Secondary | ICD-10-CM | POA: Insufficient documentation

## 2014-06-21 DIAGNOSIS — R634 Abnormal weight loss: Secondary | ICD-10-CM

## 2014-06-21 MED ORDER — SODIUM CHLORIDE 0.9 % IV SOLN
INTRAVENOUS | Status: AC
  Administered 2014-06-21: 13:00:00 via INTRAVENOUS

## 2014-06-21 MED ORDER — SODIUM CHLORIDE 0.9 % IJ SOLN
10.0000 mL | INTRAMUSCULAR | Status: DC | PRN
  Administered 2014-06-21: 10 mL
  Filled 2014-06-21: qty 10

## 2014-06-21 MED ORDER — HEPARIN SOD (PORK) LOCK FLUSH 100 UNIT/ML IV SOLN
500.0000 [IU] | Freq: Once | INTRAVENOUS | Status: DC | PRN
  Filled 2014-06-21: qty 5

## 2014-06-21 MED ORDER — HEPARIN SOD (PORK) LOCK FLUSH 100 UNIT/ML IV SOLN
500.0000 [IU] | Freq: Once | INTRAVENOUS | Status: AC
  Administered 2014-06-21: 500 [IU] via INTRAVENOUS
  Filled 2014-06-21: qty 5

## 2014-06-21 MED ORDER — SODIUM CHLORIDE 0.9 % IJ SOLN
10.0000 mL | INTRAMUSCULAR | Status: DC | PRN
  Administered 2014-06-21: 10 mL via INTRAVENOUS
  Filled 2014-06-21: qty 10

## 2014-06-21 MED ORDER — PEGFILGRASTIM INJECTION 6 MG/0.6ML ~~LOC~~
6.0000 mg | PREFILLED_SYRINGE | Freq: Once | SUBCUTANEOUS | Status: AC
  Administered 2014-06-21: 6 mg via SUBCUTANEOUS
  Filled 2014-06-21: qty 0.6

## 2014-06-21 NOTE — Patient Instructions (Signed)
Dehydration, Adult Dehydration is when you lose more fluids from the body than you take in. Vital organs like the kidneys, brain, and heart cannot function without a proper amount of fluids and salt. Any loss of fluids from the body can cause dehydration.  CAUSES   Vomiting.  Diarrhea.  Excessive sweating.  Excessive urine output.  Fever. SYMPTOMS  Mild dehydration  Thirst.  Dry lips.  Slightly dry mouth. Moderate dehydration  Very dry mouth.  Sunken eyes.  Skin does not bounce back quickly when lightly pinched and released.  Dark urine and decreased urine production.  Decreased tear production.  Headache. Severe dehydration  Very dry mouth.  Extreme thirst.  Rapid, weak pulse (more than 100 beats per minute at rest).  Cold hands and feet.  Not able to sweat in spite of heat and temperature.  Rapid breathing.  Blue lips.  Confusion and lethargy.  Difficulty being awakened.  Minimal urine production.  No tears. DIAGNOSIS  Your caregiver will diagnose dehydration based on your symptoms and your exam. Blood and urine tests will help confirm the diagnosis. The diagnostic evaluation should also identify the cause of dehydration. TREATMENT  Treatment of mild or moderate dehydration can often be done at home by increasing the amount of fluids that you drink. It is best to drink small amounts of fluid more often. Drinking too much at one time can make vomiting worse. Refer to the home care instructions below. Severe dehydration needs to be treated at the hospital where you will probably be given intravenous (IV) fluids that contain water and electrolytes. HOME CARE INSTRUCTIONS   Ask your caregiver about specific rehydration instructions.  Drink enough fluids to keep your urine clear or pale yellow.  Drink small amounts frequently if you have nausea and vomiting.  Eat as you normally do.  Avoid:  Foods or drinks high in sugar.  Carbonated  drinks.  Juice.  Extremely hot or cold fluids.  Drinks with caffeine.  Fatty, greasy foods.  Alcohol.  Tobacco.  Overeating.  Gelatin desserts.  Wash your hands well to avoid spreading bacteria and viruses.  Only take over-the-counter or prescription medicines for pain, discomfort, or fever as directed by your caregiver.  Ask your caregiver if you should continue all prescribed and over-the-counter medicines.  Keep all follow-up appointments with your caregiver. SEEK MEDICAL CARE IF:  You have abdominal pain and it increases or stays in one area (localizes).  You have a rash, stiff neck, or severe headache.  You are irritable, sleepy, or difficult to awaken.  You are weak, dizzy, or extremely thirsty. SEEK IMMEDIATE MEDICAL CARE IF:   You are unable to keep fluids down or you get worse despite treatment.  You have frequent episodes of vomiting or diarrhea.  You have blood or green matter (bile) in your vomit.  You have blood in your stool or your stool looks black and tarry.  You have not urinated in 6 to 8 hours, or you have only urinated a small amount of very dark urine.  You have a fever.  You faint. MAKE SURE YOU:   Understand these instructions.  Will watch your condition.  Will get help right away if you are not doing well or get worse. Document Released: 07/07/2005 Document Revised: 09/29/2011 Document Reviewed: 02/24/2011 Exit[employer] Patient Information 2015 [employer]. This information is not intended to replace advice given to you by your health care provider. Make sure you discuss any questions you have with your health care   provider.  

## 2014-06-23 ENCOUNTER — Telehealth: Payer: Self-pay

## 2014-06-23 ENCOUNTER — Encounter (HOSPITAL_COMMUNITY): Payer: Self-pay | Admitting: *Deleted

## 2014-06-23 ENCOUNTER — Other Ambulatory Visit: Payer: Self-pay

## 2014-06-23 DIAGNOSIS — K222 Esophageal obstruction: Secondary | ICD-10-CM

## 2014-06-23 NOTE — Telephone Encounter (Signed)
Pt scheduled for EGD with balloon dil/propofol at [facility] at 2:15pm. Pt to arrive at 1pm. Pt to have no food after midnight but may have clear liquids until 9am. Pt aware.

## 2014-06-26 ENCOUNTER — Other Ambulatory Visit: Payer: Self-pay

## 2014-06-26 ENCOUNTER — Telehealth: Payer: Self-pay | Admitting: *Deleted

## 2014-06-26 ENCOUNTER — Ambulatory Visit (HOSPITAL_COMMUNITY): Payer: Medicare Other | Admitting: Certified Registered Nurse Anesthetist

## 2014-06-26 ENCOUNTER — Encounter (HOSPITAL_COMMUNITY): Payer: Self-pay | Admitting: *Deleted

## 2014-06-26 ENCOUNTER — Observation Stay (HOSPITAL_COMMUNITY)
Admission: RE | Admit: 2014-06-26 | Discharge: 2014-06-27 | Disposition: A | Discharge status: Home / Self Care | Payer: Medicare Other | Source: Ambulatory Visit | Attending: Internal Medicine | Admitting: Internal Medicine

## 2014-06-26 ENCOUNTER — Encounter (HOSPITAL_COMMUNITY)
Admission: RE | Disposition: A | Discharge status: Home / Self Care | Payer: Self-pay | Source: Ambulatory Visit | Attending: Internal Medicine

## 2014-06-26 DIAGNOSIS — K2210 Ulcer of esophagus without bleeding: Secondary | ICD-10-CM | POA: Diagnosis not present

## 2014-06-26 DIAGNOSIS — K9189 Other postprocedural complications and disorders of digestive system: Secondary | ICD-10-CM | POA: Diagnosis not present

## 2014-06-26 DIAGNOSIS — Z8701 Personal history of pneumonia (recurrent): Secondary | ICD-10-CM | POA: Diagnosis not present

## 2014-06-26 DIAGNOSIS — E785 Hyperlipidemia, unspecified: Secondary | ICD-10-CM | POA: Diagnosis not present

## 2014-06-26 DIAGNOSIS — F419 Anxiety disorder, unspecified: Secondary | ICD-10-CM | POA: Insufficient documentation

## 2014-06-26 DIAGNOSIS — Y92239 Unspecified place in hospital as the place of occurrence of the external cause: Secondary | ICD-10-CM | POA: Diagnosis not present

## 2014-06-26 DIAGNOSIS — K219 Gastro-esophageal reflux disease without esophagitis: Secondary | ICD-10-CM | POA: Insufficient documentation

## 2014-06-26 DIAGNOSIS — K222 Esophageal obstruction: Secondary | ICD-10-CM | POA: Diagnosis not present

## 2014-06-26 DIAGNOSIS — K922 Gastrointestinal hemorrhage, unspecified: Secondary | ICD-10-CM | POA: Diagnosis not present

## 2014-06-26 DIAGNOSIS — Y832 Surgical operation with anastomosis, bypass or graft as the cause of abnormal reaction of the patient, or of later complication, without mention of misadventure at the time of the procedure: Secondary | ICD-10-CM | POA: Diagnosis not present

## 2014-06-26 DIAGNOSIS — C169 Malignant neoplasm of stomach, unspecified: Secondary | ICD-10-CM | POA: Insufficient documentation

## 2014-06-26 DIAGNOSIS — K209 Esophagitis, unspecified without bleeding: Secondary | ICD-10-CM

## 2014-06-26 DIAGNOSIS — Z681 Body mass index (BMI) 19 or less, adult: Secondary | ICD-10-CM | POA: Insufficient documentation

## 2014-06-26 DIAGNOSIS — R1310 Dysphagia, unspecified: Secondary | ICD-10-CM | POA: Diagnosis present

## 2014-06-26 DIAGNOSIS — E440 Moderate protein-calorie malnutrition: Secondary | ICD-10-CM | POA: Insufficient documentation

## 2014-06-26 DIAGNOSIS — Z90710 Acquired absence of both cervix and uterus: Secondary | ICD-10-CM | POA: Insufficient documentation

## 2014-06-26 HISTORY — PX: BALLOON DILATION: SHX5330

## 2014-06-26 HISTORY — PX: ESOPHAGOGASTRODUODENOSCOPY (EGD) WITH PROPOFOL: SHX5813

## 2014-06-26 LAB — COMPREHENSIVE METABOLIC PANEL
ALK PHOS: 68 U/L (ref 39–117)
ALT: 10 U/L (ref 0–35)
ANION GAP: 11 (ref 5–15)
AST: 17 U/L (ref 0–37)
Albumin: 2.9 g/dL — ABNORMAL LOW (ref 3.5–5.2)
BUN: 15 mg/dL (ref 6–23)
CO2: 26 mEq/L (ref 19–32)
CREATININE: 0.65 mg/dL (ref 0.50–1.10)
Calcium: 8.9 mg/dL (ref 8.4–10.5)
Chloride: 99 mEq/L (ref 96–112)
GFR calc Af Amer: >90 mL/min (ref >90)
GFR calc non Af Amer: 87 mL/min — ABNORMAL LOW (ref >90)
GLUCOSE: 109 mg/dL — AB (ref 70–99)
Potassium: 4.2 mEq/L (ref 3.7–5.3)
Sodium: 136 mEq/L — ABNORMAL LOW (ref 137–147)
TOTAL PROTEIN: 5.8 g/dL — AB (ref 6.0–8.3)
Total Bilirubin: 0.5 mg/dL (ref 0.3–1.2)

## 2014-06-26 LAB — CBC
HCT: 30.5 % — ABNORMAL LOW (ref 36.0–46.0)
HEMOGLOBIN: 10 g/dL — AB (ref 12.0–15.0)
MCH: 28.1 pg (ref 26.0–34.0)
MCHC: 32.8 g/dL (ref 30.0–36.0)
MCV: 85.7 fL (ref 78.0–100.0)
Platelets: 129 10*3/uL — ABNORMAL LOW (ref 150–400)
RBC: 3.56 MIL/uL — ABNORMAL LOW (ref 3.87–5.11)
RDW: 16 % — ABNORMAL HIGH (ref 11.5–15.5)
WBC: 6.7 10*3/uL (ref 4.0–10.5)

## 2014-06-26 LAB — HEMOGLOBIN AND HEMATOCRIT, BLOOD
HEMATOCRIT: 28.7 % — AB (ref 36.0–46.0)
Hemoglobin: 9.7 g/dL — ABNORMAL LOW (ref 12.0–15.0)

## 2014-06-26 LAB — PROTIME-INR
INR: 1.14 (ref 0.00–1.49)
PROTHROMBIN TIME: 14.7 s (ref 11.6–15.2)

## 2014-06-26 SURGERY — ESOPHAGOGASTRODUODENOSCOPY (EGD) WITH PROPOFOL
Anesthesia: Monitor Anesthesia Care

## 2014-06-26 MED ORDER — PANTOPRAZOLE SODIUM 40 MG IV SOLR
40.0000 mg | INTRAVENOUS | Status: DC
  Filled 2014-06-26 (×3): qty 40

## 2014-06-26 MED ORDER — SUCRALFATE 1 GM/10ML PO SUSP
1.0000 g | Freq: Three times a day (TID) | ORAL | Status: DC
  Administered 2014-06-26 – 2014-06-27 (×2): 1 g via ORAL
  Filled 2014-06-26 (×9): qty 10

## 2014-06-26 MED ORDER — MIRTAZAPINE 30 MG PO TABS
30.0000 mg | ORAL_TABLET | Freq: Every day | ORAL | Status: DC
  Administered 2014-06-26: 30 mg via ORAL
  Filled 2014-06-26 (×3): qty 1

## 2014-06-26 MED ORDER — ACETAMINOPHEN 325 MG PO TABS
650.0000 mg | ORAL_TABLET | Freq: Four times a day (QID) | ORAL | Status: DC | PRN
  Filled 2014-06-26: qty 2

## 2014-06-26 MED ORDER — ONDANSETRON HCL 4 MG PO TABS
4.0000 mg | ORAL_TABLET | Freq: Four times a day (QID) | ORAL | Status: DC | PRN
  Filled 2014-06-26: qty 1

## 2014-06-26 MED ORDER — LORAZEPAM 0.5 MG PO TABS: 0.5000 mg | ORAL_TABLET | Freq: Four times a day (QID) | ORAL | Status: DC | PRN

## 2014-06-26 MED ORDER — ACETAMINOPHEN 650 MG RE SUPP
650.0000 mg | Freq: Four times a day (QID) | RECTAL | Status: DC | PRN
  Filled 2014-06-26: qty 1

## 2014-06-26 MED ORDER — PROPOFOL INFUSION 10 MG/ML OPTIME
INTRAVENOUS | Status: DC | PRN
Start: 2014-06-26 — End: 2014-06-26
  Administered 2014-06-26: 100 ug/kg/min via INTRAVENOUS

## 2014-06-26 MED ORDER — ONDANSETRON HCL 4 MG/2ML IJ SOLN: 4.0000 mg | Freq: Four times a day (QID) | INTRAMUSCULAR | Status: DC | PRN

## 2014-06-26 MED ORDER — LIDOCAINE HCL (CARDIAC) 20 MG/ML IV SOLN
INTRAVENOUS | Status: AC
  Filled 2014-06-26: qty 5

## 2014-06-26 MED ORDER — HYDROMORPHONE HCL 1 MG/ML IJ SOLN
0.5000 mg | INTRAMUSCULAR | Status: DC | PRN
  Administered 2014-06-26: 0.5 mg via INTRAVENOUS
  Filled 2014-06-26: qty 1

## 2014-06-26 MED ORDER — BUTAMBEN-TETRACAINE-BENZOCAINE 2-2-14 % EX AERO
INHALATION_SPRAY | CUTANEOUS | Status: DC | PRN
  Administered 2014-06-26: 1 via TOPICAL

## 2014-06-26 MED ORDER — SODIUM CHLORIDE 0.9 % IV SOLN: INTRAVENOUS | Status: DC

## 2014-06-26 MED ORDER — LACTATED RINGERS IV SOLN
INTRAVENOUS | Status: DC | PRN
  Administered 2014-06-26: 13:00:00 via INTRAVENOUS

## 2014-06-26 MED ORDER — SUCRALFATE 1 GM/10ML PO SUSP: 1.0000 g | Freq: Four times a day (QID) | ORAL | Status: DC

## 2014-06-26 MED ORDER — PROPOFOL 10 MG/ML IV BOLUS
INTRAVENOUS | Status: AC
  Filled 2014-06-26: qty 20

## 2014-06-26 MED ORDER — DEXTROSE-NACL 5-0.45 % IV SOLN
INTRAVENOUS | Status: DC
  Administered 2014-06-26 – 2014-06-27 (×2): via INTRAVENOUS

## 2014-06-26 MED ORDER — PROPOFOL 10 MG/ML IV BOLUS
INTRAVENOUS | Status: DC | PRN
  Administered 2014-06-26: 50 mg via INTRAVENOUS
  Administered 2014-06-26: 100 mg via INTRAVENOUS

## 2014-06-26 MED ORDER — ONDANSETRON HCL 4 MG/2ML IJ SOLN
INTRAMUSCULAR | Status: AC
  Filled 2014-06-26: qty 2

## 2014-06-26 SURGICAL SUPPLY — 14 items

## 2014-06-26 NOTE — H&P (Signed)
Admission Note  Primary Care Physician:  Leamon Arnt, MD Primary Gastroenterologist:    Dr Scarlette Shorts   HPI: Lynn Morris is a 73 y.o. female known to Dr. Henrene Pastor who unfortunately was diagnosed with gastric adenocarcinoma extending to the GE junction in May 2015. Since she underwent a course of chemotherapy and then had restaging PET scan August 2015 the mild hypermetabolism associated with the distal esophagus was not readily identified. She then underwent total gastrectomy with a Roux-en-Y esophagojejunostomy 04/04/2014 and this confirmed a 3 into tumor with negative surgical margins. She started adjuvant chemotherapy 05/08/2014. Her second cycle of FOLFOX was held in early November due to neutropenia. She has been complaining of solid food dysphagia over the past couple of months which has persisted and somewhat progressed. She has lost a substantial amount of weight since her diagnosis. At this point a total of about 50 pounds. She underwent a barium swallow on 06/21/2014 and was found to have an anastomotic stricture. She was set up for EGD with dilation today with Dr. Henrene Pastor. 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 She had significant immediate post dilation bleeding. This resolved spontaneously after observing endoscopically and washing. She did not require any intervention. Due to the significant bleeding decision made to admit her overnight to observation to monitor. Postprocedure patient states she feels fine she has some minimal discomfort in the epigastric area.   Past Medical History  Diagnosis Date  . Esophageal stricture   . Hyperlipemia   . Arthritis     LG TOE  . Cataract     right eye and immature  . Complication of anesthesia   . PONV (postoperative nausea and vomiting)   . Hiatal hernia 12-13-13    intermittent nausea with vomiting  . History of blood transfusion     no abnormal  reaction noted  . Pneumonia     touch of it many yrs ago(>36yrs)  . History of bronchitis > 15yrs ago  . History of migraine     many yrs ago  . Esophageal reflux     was on Prilosec;has been off a yr  . Diverticulosis   . Anxiety     takes Zoloft every other day  . Optic neuritis     many yrs ago  . Cancer 11/24/13    gastroesophageal carcinoma    Past Surgical History  Procedure Laterality Date  . Abdominal hysterectomy  1978    Partial   . Cesarean section  1964 & 1966  . Nose surgery  1970  . Appendectomy    . Abdominal hysterectomy    . Eus N/A 12/22/2013    Procedure: UPPER ENDOSCOPIC ULTRASOUND (EUS) LINEAR;  Surgeon: Milus Banister, MD;  Location: [facility];  Service: Endoscopy;  Laterality: N/A;  . Portacath placement N/A 12/27/2013    Procedure: INSERTION PORT-A-CATH;  Surgeon: Stark Klein, MD;  Location: [facility];  Service: General;  Laterality: N/A;  . Esophagogastroduodenoscopy      with dilitation  . Colonoscopy    . Laparoscopy N/A 04/04/2014    Procedure: DIAGNOSTIC LAPAROSCOPY;  Surgeon: Stark Klein, MD;  Location: [place];  Service: General;  Laterality: N/A;  . Partial gastrectomy N/A 04/04/2014    Procedure: TOTAL GASTRECTOMY;  Surgeon: Stark Klein, MD;  Location: [place];  Service: General;  Laterality: N/A;  . Jejunostomy N/A 04/04/2014    Procedure: ESOPHAGOJEJUNOSTOMY AND  FEEDING JEJUNOSTOMY;  Surgeon: Stark Klein, MD;  Location: [facility];  Service: General;  Laterality: N/A;    Prior to Admission medications   Medication Sig Start Date End Date Taking? Authorizing Provider  Cholecalciferol (VITAMIN D3) 2000 UNITS TABS Take 1 tablet by mouth. 04/21/11  Yes Historical Provider, MD  diphenoxylate-atropine (LOMOTIL) 2.5-0.025 MG per tablet Take 1 tablet by mouth as needed for diarrhea or loose stools (Take one after each diarrhea stool max 8/day (take if Imodium not effective)). 06/19/14  Yes Ladell Pier, MD  lidocaine-prilocaine (EMLA) cream Apply to port a  cath site one hour prior to use. Cover with plastic. Do not rub in. 05/05/14  Yes Ladell Pier, MD  megestrol (MEGACE) 40 MG/ML suspension Take 5 mLs (200 mg total) by mouth 2 (two) times daily. 06/19/14  Yes Ladell Pier, MD  metoCLOPramide (REGLAN) 10 MG tablet Take 1 tablet (10 mg total) by mouth 3 (three) times daily before meals. 05/29/14  Yes Ladell Pier, MD  mirtazapine (REMERON) 30 MG tablet Take 1 tablet (30 mg total) by mouth at bedtime. 06/19/14  Yes Ladell Pier, MD  ondansetron (ZOFRAN) 8 MG tablet Take 1 tablet (8 mg total) by mouth every 8 (eight) hours as needed for nausea or vomiting. 05/08/14  Yes Owens Shark, NP  sertraline (ZOLOFT) 100 MG tablet Take 100 mg by mouth every other day.    Yes Historical Provider, MD  ibuprofen (ADVIL,MOTRIN) 200 MG tablet Take 200 mg by mouth every 6 (six) hours as needed for moderate pain.     Historical Provider, MD  sucralfate (CARAFATE) 1 GM/10ML suspension Take 10 mLs (1 g total) by mouth 4 (four) times daily. Patient not taking: Reported on 06/26/2014 06/26/14   Irene Shipper, MD    Current Facility-Administered Medications  Medication Dose Route Frequency Provider Last Rate Last Dose  . 0.9 %  sodium chloride infusion   Intravenous Continuous Irene Shipper, MD      . acetaminophen (TYLENOL) tablet 650 mg  650 mg Oral Q6H PRN Amy S Esterwood, PA-C       Or  . acetaminophen (TYLENOL) suppository 650 mg  650 mg Rectal Q6H PRN Amy S Esterwood, PA-C      . dextrose 5 %-0.45 % sodium chloride infusion   Intravenous Continuous Amy S Esterwood, PA-C 100 mL/hr at 06/26/14 1703    . HYDROmorphone (DILAUDID) injection 0.5 mg  0.5 mg Intravenous Q3H PRN Amy S Esterwood, PA-C      . mirtazapine (REMERON) tablet 30 mg  30 mg Oral QHS Amy S Esterwood, PA-C      . ondansetron (ZOFRAN) tablet 4 mg  4 mg Oral Q6H PRN Amy S Esterwood, PA-C       Or  . ondansetron (ZOFRAN) injection 4 mg  4 mg Intravenous Q6H PRN Amy S Esterwood, PA-C      .  pantoprazole (PROTONIX) injection 40 mg  40 mg Intravenous Q24H Amy S Esterwood, PA-C      . sucralfate (CARAFATE) 1 GM/10ML suspension 1 g  1 g Oral TID WC & HS Amy S Esterwood, PA-C        Allergies as of 06/23/2014 - Review Complete 06/23/2014  Allergen Reaction Noted  . Codeine  11/16/2013    Family History  Problem Relation Age of Onset  . Diabetes Mother   . Hypertension Mother   . Pancreatic cancer Mother   . Colon cancer Neg Hx  History   Social History  . Marital Status: Married    Spouse Name: N/A    Number of Children: 2  . Years of Education: N/A   Occupational History  . Retired    Social History Main Topics  . Smoking status: Never Smoker   . Smokeless tobacco: Never Used  . Alcohol Use: No  . Drug Use: No  . Sexual Activity: Yes    Birth Control/ Protection: Surgical   Other Topics Concern  . Not on file   Social History Narrative   2 caffeine drinks daily     Review of Systems:  All systems reviewed an negative except where noted in HPI.    Physical Exam: Vital signs in last 24 hours: Temp:  [98.1 F (36.7 C)-98.9 F (37.2 C)] 98.9 F (37.2 C) (12/07 1640) Pulse Rate:  [76-91] 78 (12/07 1640) Resp:  [13-21] 18 (12/07 1640) BP: (116-160)/(67-91) 136/67 mmHg (12/07 1640) SpO2:  [94 %-97 %] 96 % (12/07 1640) Weight:  [95 lb (43.092 kg)] 95 lb (43.092 kg) (12/07 1640)   General:  Pleasant, well-developed, thin older WF female in NAD Head:  Normocephalic and atraumatic. Eyes:  Sclera clear, no icterus.   Conjunctiva pink. Ears:  Normal auditory acuity. Mouth:  No deformity or lesions.  Neck:  Supple; no masses . Lungs:  Clear throughout to auscultation.   No wheezes, crackles, or rhonchi. No acute distress. Heart:  Regular rate and rhythm; no murmurs. Abdomen:  Soft, nondistended,  Minimally tender. No masses, hepatomegaly. , no guarding or rebound Rectal:  Not done   Msk:  Symmetrical without gross deformities.. Pulses:  Normal  pulses noted. Extremities:  Without edema. Neurologic:  Alert and  oriented x4;  grossly normal neurologically. Skin:  Intact without significant lesions or rashes. Cervical Nodes:  No significant cervical adenopathy. Psych:  Alert and cooperative. Normal mood and affect.  Lab Results:  Recent Labs  06/26/14 1655  WBC 6.7  HGB 10.0*  HCT 30.5*  PLT 129*   BMET No results for input(s): NA, K, CL, CO2, GLUCOSE, BUN, CREATININE, CALCIUM in the last 72 hours. LFT No results for input(s): PROT, ALBUMIN, AST, ALT, ALKPHOS, BILITOT, BILIDIR, IBILI in the last 72 hours. PT/INR No results for input(s): LABPROT, INR in the last 72 hours. Hepatitis Panel No results for input(s): HEPBSAG, HCVAB, HEPAIGM, HEPBIGM in the last 72 hours.  Studies/Results: No results found.  Impression / Plan:  #1 73 yo female with hx of adenocarcinoma of the stomach s/p total gastrectomy with Roux-en -y esophagojejunostomy 03/2014. Presents with dysphagia and found to have anastomotic stricture. Pt underwent EGD with  Balloon dilation of esophago-jejunal anastomotic stricture  Today and had bleeding post procedure  Which susided without intervention. Admitted to overnight observation to  Monitor for possibility of recurrent GI Bleeding #2 severe ulcerative esophagitis  #3 significant weight loss-proximally 50 pounds since diagnosis #4 Diarrhea- multifactorial #5 pt undergoing chemo-FOLFOX 10/19, held 11/9 secondary to neutropenia  Plan; Serial hgbs .,PT/INR Clear liquids Carafate suspension Q6 Hopefully home in am       LOS: 0 days   Amy Esterwood  06/26/2014, 5:12 PM  Above assessment and plan per me   Docia Chuck. Geri Seminole., M.D. [facility] Division of Gastroenterology

## 2014-06-26 NOTE — Transfer of Care (Signed)
Immediate Anesthesia Transfer of Care Note  Patient: Lynn Morris  Procedure(s) Performed: Procedure(s): ESOPHAGOGASTRODUODENOSCOPY (EGD) WITH PROPOFOL (N/A) BALLOON DILATION (N/A)  Patient Location: PACU  Anesthesia Type:MAC  Level of Consciousness: awake, alert  and oriented  Airway & Oxygen Therapy: Patient Spontanous Breathing and Patient connected to nasal cannula oxygen  Post-op Assessment: Report given to PACU RN and Post -op Vital signs reviewed and stable  Post vital signs: Reviewed and stable  Complications: No apparent anesthesia complications

## 2014-06-26 NOTE — H&P (Signed)
HISTORY OF PRESENT ILLNESS:  Lynn Morris is a 73 y.o. female with gastric cancer for she is status post total gastrectomy with esophago- enteric anastomosis. Recent problems with dysphagia to solids. Esophagogram from last week (reviewed) reveals an anastomotic stricture. She is now for upper endoscopy with esophageal dilation  REVIEW OF SYSTEMS:  All non-GI ROS negative upon review  Past Medical History  Diagnosis Date  . Esophageal stricture   . Hyperlipemia   . Arthritis     LG TOE  . Cataract     right eye and immature  . Complication of anesthesia   . PONV (postoperative nausea and vomiting)   . Hiatal hernia 12-13-13    intermittent nausea with vomiting  . History of blood transfusion     no abnormal reaction noted  . Pneumonia     touch of it many yrs ago(>33yrs)  . History of bronchitis > 17yrs ago  . History of migraine     many yrs ago  . Esophageal reflux     was on Prilosec;has been off a yr  . Diverticulosis   . Anxiety     takes Zoloft every other day  . Optic neuritis     many yrs ago  . Cancer 11/24/13    gastroesophageal carcinoma    Past Surgical History  Procedure Laterality Date  . Abdominal hysterectomy  1978    Partial   . Cesarean section  1964 & 1966  . Nose surgery  1970  . Appendectomy    . Abdominal hysterectomy    . Eus N/A 12/22/2013    Procedure: UPPER ENDOSCOPIC ULTRASOUND (EUS) LINEAR;  Surgeon: Milus Banister, MD;  Location: [facility];  Service: Endoscopy;  Laterality: N/A;  . Portacath placement N/A 12/27/2013    Procedure: INSERTION PORT-A-CATH;  Surgeon: Stark Klein, MD;  Location: [place];  Service: General;  Laterality: N/A;  . Esophagogastroduodenoscopy      with dilitation  . Colonoscopy    . Laparoscopy N/A 04/04/2014    Procedure: DIAGNOSTIC LAPAROSCOPY;  Surgeon: Stark Klein, MD;  Location: [place];  Service: General;  Laterality: N/A;  . Partial gastrectomy N/A 04/04/2014    Procedure: TOTAL GASTRECTOMY;  Surgeon: Stark Klein, MD;  Location: [place];  Service: General;  Laterality: N/A;  . Jejunostomy N/A 04/04/2014    Procedure: ESOPHAGOJEJUNOSTOMY AND FEEDING JEJUNOSTOMY;  Surgeon: Stark Klein, MD;  Location: [place];  Service: General;  Laterality: N/A;    Social History Lynn Morris  reports that she has never smoked. She has never used smokeless tobacco. She reports that she does not drink alcohol or use illicit drugs.  family history includes Diabetes in her mother; Hypertension in her mother; Pancreatic cancer in her mother. There is no history of Colon cancer.  Allergies  Allergen Reactions  . Codeine     Cant remember       PHYSICAL EXAMINATION: Vital signs: BP 116/70 mmHg  Temp(Src) 98.5 F (36.9 C) (Oral)  Resp 17  SpO2 97% General: Well-developed, well-nourished, no acute distress HEENT: Sclerae are anicteric, conjunctiva pink. Oral mucosa intact Lungs: Clear Heart: Regular Abdomen: soft, nontender, nondistended, no obvious ascites, no peritoneal signs, normal bowel sounds. No organomegaly. Extremities: No edema Psychiatric: alert and oriented x3. Cooperative     ASSESSMENT:   #1. Gastric cancer status post total gastrectomy #2. Dysphagia secondary to anastomotic stricture     PLAN:  #1. Upper endoscopy with anastomotic stricture dilation.The nature of the procedure, as  well as the risks, benefits, and alternatives were carefully and thoroughly reviewed with the patient. Ample time for discussion and questions allowed. The patient understood, was satisfied, and agreed to proceed.

## 2014-06-26 NOTE — Anesthesia Preprocedure Evaluation (Signed)
Anesthesia Evaluation  Patient identified by MRN, date of birth, ID band Patient awake    Reviewed: Allergy & Precautions, H&P , NPO status , Patient's Chart, lab work & pertinent test results  History of Anesthesia Complications (+) PONV  Airway Mallampati: II  TM Distance: >3 FB Neck ROM: full    Dental  (+) Edentulous Upper, Dental Advisory Given   Pulmonary pneumonia -, resolved,  breath sounds clear to auscultation  Pulmonary exam normal       Cardiovascular Exercise Tolerance: Good negative cardio ROS  Rhythm:regular Rate:Normal     Neuro/Psych PSYCHIATRIC DISORDERS Anxiety Depression negative neurological ROS     GI/Hepatic Neg liver ROS, hiatal hernia, GERD-  Medicated and Controlled,Gastro-esophageal cancer   Endo/Other  negative endocrine ROS  Renal/GU negative Renal ROS     Musculoskeletal  (+) Arthritis -,   Abdominal   Peds  Hematology negative hematology ROS (+)   Anesthesia Other Findings   Reproductive/Obstetrics negative OB ROS                             Anesthesia Physical  Anesthesia Plan  ASA: III  Anesthesia Plan: MAC   Post-op Pain Management:    Induction:   Airway Management Planned:   Additional Equipment:   Intra-op Plan:   Post-operative Plan:   Informed Consent: I have reviewed the patients History and Physical, chart, labs and discussed the procedure including the risks, benefits and alternatives for the proposed anesthesia with the patient or authorized representative who has indicated his/her understanding and acceptance.   Dental Advisory Given  Plan Discussed with: CRNA  Anesthesia Plan Comments:         Anesthesia Quick Evaluation

## 2014-06-26 NOTE — Telephone Encounter (Signed)
Received message from pt asking "my friend says she gets a B12 shot and that helps her energy; does Dr. Benay Spice do that?"  Per Dr. Benay Spice; notified pt only if she has a B12 deficiency; will add to lab work for 07/04/14.  Pt verbalized understanding.

## 2014-06-27 ENCOUNTER — Encounter (HOSPITAL_COMMUNITY): Payer: Self-pay | Admitting: Internal Medicine

## 2014-06-27 DIAGNOSIS — K9189 Other postprocedural complications and disorders of digestive system: Secondary | ICD-10-CM | POA: Diagnosis not present

## 2014-06-27 LAB — HEMOGLOBIN AND HEMATOCRIT, BLOOD
HEMATOCRIT: 27.1 % — AB (ref 36.0–46.0)
HEMATOCRIT: 27.4 % — AB (ref 36.0–46.0)
HEMOGLOBIN: 8.9 g/dL — AB (ref 12.0–15.0)
Hemoglobin: 9.2 g/dL — ABNORMAL LOW (ref 12.0–15.0)

## 2014-06-27 MED ORDER — SUCRALFATE 1 GM/10ML PO SUSP: 1.0000 g | Freq: Three times a day (TID) | ORAL | Status: DC

## 2014-06-27 MED ORDER — FAMOTIDINE 20 MG PO TABS: 20.0000 mg | ORAL_TABLET | Freq: Two times a day (BID) | ORAL | Status: DC

## 2014-06-27 NOTE — Progress Notes (Signed)
UR completed 

## 2014-06-27 NOTE — Progress Notes (Signed)
Patient ID: Lynn Morris, female   DOB: [birth year], 73 y.o.   MRN: [id number]  [facility] Progress Note  Subjective: Feels fine, no pain, no BM since admit- spit up a tinge of pink this am HGB 9.2  Stable past 12 hours  Objective:  Vital signs in last 24 hours: Temp:  [98.1 F (36.7 C)-99.7 F (37.6 C)] 98.2 F (36.8 C) (12/08 0540) Pulse Rate:  [76-97] 88 (12/08 0540) Resp:  [13-21] 20 (12/08 0540) BP: (107-160)/(60-91) 107/60 mmHg (12/08 0540) SpO2:  [94 %-98 %] 98 % (12/08 0540) Weight:  [95 lb (43.092 kg)] 95 lb (43.092 kg) (12/07 1640) Last BM Date: 06/24/14 General:   Alert,  Well-developed,    in NAD Heart:  Regular rate and rhythm; no murmurs Pulm; Abdomen:  Soft, nontender and nondistended. Normal bowel sounds, without guarding, and without rebound.   Extremities:  Without edema. Neurologic:  Alert and  oriented x4;  grossly normal neurologically. Psych:  Alert and cooperative. Normal mood and affect.  Intake/Output from previous day: 12/07 0701 - 12/08 0700 In: 800 [I.V.:800] Out: 650 [Urine:650] Intake/Output this shift:    Lab Results:  Recent Labs  06/26/14 1655 06/26/14 2116 06/27/14 0330 06/27/14 0941  WBC 6.7  --   --   --   HGB 10.0* 9.7* 8.9* 9.2*  HCT 30.5* 28.7* 27.1* 27.4*  PLT 129*  --   --   --    BMET  Recent Labs  06/26/14 1655  NA 136*  K 4.2  CL 99  CO2 26  GLUCOSE 109*  BUN 15  CREATININE 0.65  CALCIUM 8.9   LFT  Recent Labs  06/26/14 1655  PROT 5.8*  ALBUMIN 2.9*  AST 17  ALT 10  ALKPHOS 68  BILITOT 0.5   PT/INR  Recent Labs  06/26/14 1655  LABPROT 14.7  INR 1.14     Assessment / Plan: #1 73 yo female s/p balloon dilation of esophago-jejunal anastomosis 12/7 with post procedure transient self limited  Bleeding #2 gastric adenocarcinoma-s/p total gastrectomy 03/2014-undergoing chemo #3 severe ulcerati ve esophagitis No evidence for ongoing GI bleeding- stable for discharge home today She  will take it [place] next several days, stay on full liquid diet next 72 hours then very soft Ok for chemo next week Carafate 1 gm suspension 4 times daily for next few months, also add BID pepcid Office visit with Dr Henrene Pastor on 1/6 2016 at 9 am  Active Problems:   Esophageal stricture   Acute esophagitis   Upper GI bleeding     LOS: 1 day   Amy Esterwood  06/27/2014, 11:28 AM  GI ATTENDING  My assessment and plan as outlined above. See discharge summary.  Docia Chuck. Geri Seminole., M.D. [facility] Division of Gastroenterology

## 2014-06-27 NOTE — Progress Notes (Signed)
Pt discharged home with spouse in stable condition. Discharge instructions and scripts given. Pt verbalized understanding 

## 2014-06-27 NOTE — Discharge Summary (Signed)
[facility] Discharge Summary  Name: Lynn Morris MRN: [id number] DOB: January 24, 1941 73 y.o. PCP:  Leamon Arnt, MD  Date of Admission: 06/26/2014 12:35 PM Date of Discharge: 06/27/2014 Attending Physician: Irene Shipper, MD  Discharge Diagnosis: Active Problems:   Esophageal stricture   Acute esophagitis   Upper GI bleeding #1  Self limited bleed s/p Balloon dilaltion of esophago-jejunal anastomotic stricture in pt s/p total gastrectomy for gastric cancer 03/2014 #2 severe ulcerative esophagitis.  Consultations: none Procedures Performed:  Dg Esophagus  06/21/2014   ADDENDUM REPORT: 06/21/2014 12:57  ADDENDUM: This addendum is made to the clinical data for the initially dictated report. Discomfort and difficulty swallowing. The patient feels as if food becomes stuck in her throat.   Electronically Signed   By: Inge Rise M.D.   On: 06/21/2014 12:57   06/21/2014   CLINICAL DATA:  Status post total gastrectomy and esophagojejunostomy 04/04/2014 for carcinoma.  EXAM: ESOPHOGRAM/BARIUM SWALLOW  TECHNIQUE: Single contrast examination was performed using  thin barium.  FLUOROSCOPY TIME:  1 min, 57 seconds.  COMPARISON:  Esophagram 04/10/2014.  FINDINGS: Swallowing function appeared normal. Contrast flowed through the esophagus and through the surgical anastomosis and into small bowel. No leakage of contrast is identified. The patient was given a 13 mm barium tablet which lodged in the distal esophagus just cephalad near the anastomosis. The patient reported that this recreated her symptoms of discomfort while eating. The pill could not be induced to pass with swallows of barium.  IMPRESSION: 13 mm barium tablet lodged in the distal esophagus near the esophagojejunostomy anastomosis recreating the patient's symptoms.  Negative for leak.  Electronically Signed: By: Inge Rise M.D. On: 06/21/2014 12:49    GI Procedures:EGD with balloon dilation 06/26/2014  History/Physical Exam:   See Admission H&P  Admission HPI:  73 yo female  With gastric adenocarcinoma s/p total gastrectomy and esophago-jejunal anastomosis 9/15 with persistent c/o solid food dysphagia, and anastomotic stricture identified on barium swallow. Pt had EGD with Balloon dilation on day of admit and was admitted for observation because of immediate post dilation  bleeding .  Hospital Course by problem list: Active Problems:   Esophageal stricture   Acute esophagitis   Upper GI bleeding Admitted overnight for observation and serial hgb's. Pt had benign course with no ongoing Gi bleeding. She is discharged home the following morning to out pt  Follow up. Hgb 9.2 on discharge.  Discharge Vitals:  BP 107/60 mmHg  Pulse 88  Temp(Src) 98.2 F (36.8 C) (Oral)  Resp 20  Ht 5\' 1"  (1.549 m)  Wt 95 lb (43.092 kg)  BMI 17.96 kg/m2  SpO2 98%  Discharge Labs:  Results for orders placed or performed during the hospital encounter of 06/26/14 (from the past 24 hour(s))  Comprehensive metabolic panel     Status: Abnormal   Collection Time: 06/26/14  4:55 PM  Result Value Ref Range   Sodium 136 (L) 137 - 147 mEq/L   Potassium 4.2 3.7 - 5.3 mEq/L   Chloride 99 96 - 112 mEq/L   CO2 26 19 - 32 mEq/L   Glucose, Bld 109 (H) 70 - 99 mg/dL   BUN 15 6 - 23 mg/dL   Creatinine, Ser 0.65 0.50 - 1.10 mg/dL   Calcium 8.9 8.4 - 10.5 mg/dL   Total Protein 5.8 (L) 6.0 - 8.3 g/dL   Albumin 2.9 (L) 3.5 - 5.2 g/dL   AST 17 0 - 37 U/L   ALT 10 0 -  35 U/L   Alkaline Phosphatase 68 39 - 117 U/L   Total Bilirubin 0.5 0.3 - 1.2 mg/dL   GFR calc non Af Amer 87 (L) >90 mL/min   GFR calc Af Amer >90 >90 mL/min   Anion gap 11 5 - 15  CBC     Status: Abnormal   Collection Time: 06/26/14  4:55 PM  Result Value Ref Range   WBC 6.7 4.0 - 10.5 K/uL   RBC 3.56 (L) 3.87 - 5.11 MIL/uL   Hemoglobin 10.0 (L) 12.0 - 15.0 g/dL   HCT 30.5 (L) 36.0 - 46.0 %   MCV 85.7 78.0 - 100.0 fL   MCH 28.1 26.0 - 34.0 pg   MCHC 32.8 30.0 - 36.0  g/dL   RDW 16.0 (H) 11.5 - 15.5 %   Platelets 129 (L) 150 - 400 K/uL  Protime-INR     Status: None   Collection Time: 06/26/14  4:55 PM  Result Value Ref Range   Prothrombin Time 14.7 11.6 - 15.2 seconds   INR 1.14 0.00 - 1.49  Hemoglobin and hematocrit, blood     Status: Abnormal   Collection Time: 06/26/14  9:16 PM  Result Value Ref Range   Hemoglobin 9.7 (L) 12.0 - 15.0 g/dL   HCT 28.7 (L) 36.0 - 46.0 %  Hemoglobin and hematocrit, blood     Status: Abnormal   Collection Time: 06/27/14  3:30 AM  Result Value Ref Range   Hemoglobin 8.9 (L) 12.0 - 15.0 g/dL   HCT 27.1 (L) 36.0 - 46.0 %  Hemoglobin and hematocrit, blood     Status: Abnormal   Collection Time: 06/27/14  9:41 AM  Result Value Ref Range   Hemoglobin 9.2 (L) 12.0 - 15.0 g/dL   HCT 27.4 (L) 36.0 - 46.0 %    Disposition and follow-up:   Ms.Kathleena L Stambaugh was discharged from [facility] in stable condition.    Follow-up Appointments: Dr Henrene Pastor 1/6 /2016  At 9 am   Discharge Medications:   Medication List    STOP taking these medications        ibuprofen 200 MG tablet  Commonly known as:  ADVIL,MOTRIN      TAKE these medications        diphenoxylate-atropine 2.5-0.025 MG per tablet  Commonly known as:  LOMOTIL  Take 1 tablet by mouth as needed for diarrhea or loose stools (Take one after each diarrhea stool max 8/day (take if Imodium not effective)).     famotidine 20 MG tablet  Commonly known as:  PEPCID  Take 1 tablet (20 mg total) by mouth 2 (two) times daily.     lidocaine-prilocaine cream  Commonly known as:  EMLA  Apply to port a cath site one hour prior to use. Cover with plastic. Do not rub in.     megestrol 40 MG/ML suspension  Commonly known as:  MEGACE  Take 5 mLs (200 mg total) by mouth 2 (two) times daily.     metoCLOPramide 10 MG tablet  Commonly known as:  REGLAN  Take 1 tablet (10 mg total) by mouth 3 (three) times daily before meals.     mirtazapine 30 MG tablet  Commonly  known as:  REMERON  Take 1 tablet (30 mg total) by mouth at bedtime.     ondansetron 8 MG tablet  Commonly known as:  ZOFRAN  Take 1 tablet (8 mg total) by mouth every 8 (eight) hours as needed for nausea or vomiting.  PRESCRIPTION MEDICATION  Chemo at [facility]     sertraline 100 MG tablet  Commonly known as:  ZOLOFT  Take 100 mg by mouth every other day.     sucralfate 1 GM/10ML suspension  Commonly known as:  CARAFATE  Take 10 mLs (1 g total) by mouth 4 (four) times daily.     sucralfate 1 GM/10ML suspension  Commonly known as:  CARAFATE  Take 10 mLs (1 g total) by mouth 4 (four) times daily -  with meals and at bedtime.     Vitamin D3 2000 UNITS Tabs  Take 1 tablet by mouth.        Signed: Nicoletta Ba 06/27/2014, 12:04 PM   Agree with discharge summary as above  Docia Chuck. Geri Seminole., M.D. [facility] Division of Gastroenterology

## 2014-06-27 NOTE — Anesthesia Postprocedure Evaluation (Signed)
Anesthesia Post Note  Patient: Lynn Morris  Procedure(s) Performed: Procedure(s) (LRB): ESOPHAGOGASTRODUODENOSCOPY (EGD) WITH PROPOFOL (N/A) BALLOON DILATION (N/A)  Anesthesia type: MAC  Patient location: PACU  Post pain: Pain level controlled  Post assessment: Post-op Vital signs reviewed  Last Vitals: BP 107/60 mmHg  Pulse 88  Temp(Src) 36.8 C (Oral)  Resp 20  Ht 5\' 1"  (1.549 m)  Wt 95 lb (43.092 kg)  BMI 17.96 kg/m2  SpO2 98%  Post vital signs: Reviewed  Level of consciousness: awake  Complications: No apparent anesthesia complications

## 2014-06-27 NOTE — Progress Notes (Signed)
INITIAL NUTRITION ASSESSMENT  DOCUMENTATION CODES Per approved criteria  -Non-severe (moderate) malnutrition in the context of chronic illness  Pt meets criteria for moderate MALNUTRITION in the context of chronic illness as evidenced by 23% wt loss x 2 months and moderate muscle and fat depletion.  INTERVENTION: Encourage PO intake RD to continue to monitor  NUTRITION DIAGNOSIS: Increased nutrient (protein) needs related to advanced cancer as evidenced by 23% wt loss x 2 months.   Goal: Pt to meet >/= 90% of their estimated nutrition needs   Monitor:  PO and supplemental intake, weight, labs, I/O's  Reason for Assessment: Pt identified as at nutrition risk on the Malnutrition Screen Tool  Admitting Dx: <principal problem not specified>  ASSESSMENT: 73 y.o. female known to Dr. Henrene Pastor who unfortunately was diagnosed with gastric adenocarcinoma extending to the GE junction in May 2015. She then underwent total gastrectomy with a Roux-en-Y esophagojejunostomy 04/04/2014. She has been complaining of solid food dysphagia over the past couple of months which has persisted and somewhat progressed. She has lost a substantial amount of weight since her diagnosis. At this point a total of about 50 pounds.  PO intake: broth and sips of juice this AM ~25%  Pt is followed by [place], last seen 11/30. Pt with no further wt loss since that visit. Pt has had a 28 lb weight loss since September (23% wt loss x 2 months, significant for time frame). Pt reports drinking 2-3 Ensure a day along with ENU supplements if available.  Pt declines nutritional supplement at this time, would like to speak with MD before anything is ordered. Pt states that she is to go home today. RD to follow-up with pt if still admitted tomorrow 12/9.  Nutrition Focused Physical Exam:  Subcutaneous Fat:  Orbital Region: WNL Upper Arm Region: mild/moderate depletion Thoracic and Lumbar Region: NA  Muscle:  Temple  Region: mild depletion Clavicle Bone Region: moderate depletion Clavicle and Acromion Bone Region: moderate depletion Scapular Bone Region: moderate depletion Dorsal Hand: mild depletion Patellar Region: NA Anterior Thigh Region: NA Posterior Calf Region: NA  Edema: no LE edema  Labs reviewed: Low Na Glucose 109  Height: Ht Readings from Last 1 Encounters:  06/26/14 5\' 1"  (1.549 m)    Weight: Wt Readings from Last 1 Encounters:  06/26/14 95 lb (43.092 kg)    Ideal Body Weight: 105 lb  % Ideal Body Weight: 90%  Wt Readings from Last 10 Encounters:  06/26/14 95 lb (43.092 kg)  06/19/14 95 lb 4.8 oz (43.228 kg)  06/05/14 102 lb (46.267 kg)  06/02/14 103 lb 6.4 oz (46.902 kg)  05/29/14 100 lb 4.8 oz (45.496 kg)  05/16/14 102 lb 3.2 oz (46.358 kg)  05/08/14 106 lb 11.2 oz (48.399 kg)  04/28/14 109 lb 1.6 oz (49.487 kg)  04/07/14 123 lb 11.2 oz (56.11 kg)  03/31/14 119 lb (53.978 kg)    Usual Body Weight: 164 lb  % Usual Body Weight: 58%  BMI:  Body mass index is 17.96 kg/(m^2).  Estimated Nutritional Needs: Kcal: 1400-1600 Protein: 60-70g Fluid: 1.5L/day  Skin: intact  Diet Order: Diet clear liquid  EDUCATION NEEDS: -No education needs identified at this time   Intake/Output Summary (Last 24 hours) at 06/27/14 0949 Last data filed at 06/27/14 0540  Gross per 24 hour  Intake    800 ml  Output    650 ml  Net    150 ml    Last BM: 12/5  Labs:  Recent Labs Lab 06/26/14 1655  NA 136*  K 4.2  CL 99  CO2 26  BUN 15  CREATININE 0.65  CALCIUM 8.9  GLUCOSE 109*    CBG (last 3)  No results for input(s): GLUCAP in the last 72 hours.  Scheduled Meds: . mirtazapine  30 mg Oral QHS  . pantoprazole (PROTONIX) IV  40 mg Intravenous Q24H  . sucralfate  1 g Oral TID WC & HS    Continuous Infusions: . sodium chloride    . dextrose 5 % and 0.45% NaCl 100 mL/hr at 06/27/14 2263    Past Medical History  Diagnosis Date  . Esophageal stricture    . Hyperlipemia   . Arthritis     LG TOE  . Cataract     right eye and immature  . Complication of anesthesia   . PONV (postoperative nausea and vomiting)   . Hiatal hernia 12-13-13    intermittent nausea with vomiting  . History of blood transfusion     no abnormal reaction noted  . Pneumonia     touch of it many yrs ago(>63yrs)  . History of bronchitis > 52yrs ago  . History of migraine     many yrs ago  . Esophageal reflux     was on Prilosec;has been off a yr  . Diverticulosis   . Anxiety     takes Zoloft every other day  . Optic neuritis     many yrs ago  . Cancer 11/24/13    gastroesophageal carcinoma    Past Surgical History  Procedure Laterality Date  . Abdominal hysterectomy  1978    Partial   . Cesarean section  1964 & 1966  . Nose surgery  1970  . Appendectomy    . Abdominal hysterectomy    . Eus N/A 12/22/2013    Procedure: UPPER ENDOSCOPIC ULTRASOUND (EUS) LINEAR;  Surgeon: Milus Banister, MD;  Location: [facility];  Service: Endoscopy;  Laterality: N/A;  . Portacath placement N/A 12/27/2013    Procedure: INSERTION PORT-A-CATH;  Surgeon: Stark Klein, MD;  Location: [place];  Service: General;  Laterality: N/A;  . Esophagogastroduodenoscopy      with dilitation  . Colonoscopy    . Laparoscopy N/A 04/04/2014    Procedure: DIAGNOSTIC LAPAROSCOPY;  Surgeon: Stark Klein, MD;  Location: [place];  Service: General;  Laterality: N/A;  . Partial gastrectomy N/A 04/04/2014    Procedure: TOTAL GASTRECTOMY;  Surgeon: Stark Klein, MD;  Location: [place];  Service: General;  Laterality: N/A;  . Jejunostomy N/A 04/04/2014    Procedure: ESOPHAGOJEJUNOSTOMY AND FEEDING JEJUNOSTOMY;  Surgeon: Stark Klein, MD;  Location: [place];  Service: General;  Laterality: N/A;  . Esophagogastroduodenoscopy (egd) with propofol N/A 06/26/2014    Procedure: ESOPHAGOGASTRODUODENOSCOPY (EGD) WITH PROPOFOL;  Surgeon: Irene Shipper, MD;  [facility];  Service: Endoscopy;  Laterality: N/A;   . Balloon dilation N/A 06/26/2014    Procedure: BALLOON DILATION;  Surgeon: Irene Shipper, MD;  Location[facility];  Service: Endoscopy;  Laterality: N/A;    Clayton Bibles, MS, RD, LDN Pager: [phone] After Hours Pager: [phone]

## 2014-06-27 NOTE — Progress Notes (Signed)
On-call Henrene Pastor, J MD) notified of Hgb 8.9 as per order. No new orders. Will continue to monitor.

## 2014-06-28 NOTE — Op Note (Signed)
[facility] [place], [address]   ENDOSCOPY PROCEDURE REPORT  PATIENT: Lynn Morris, Lynn Morris  MR#: [id number] BIRTHDATE: 1941/01/23 , 73  yrs. old GENDER: female ENDOSCOPIST: Eustace Quail, MD REFERRED BY:  Stark Klein, M.D. PROCEDURE DATE:  06/26/2014 PROCEDURE:  EGD w/ balloon dilation ASA CLASS:     Class III INDICATIONS:  dysphagia and abnormal barium esophagogram. MEDICATIONS: Monitored anesthesia care and Per Anesthesia TOPICAL ANESTHETIC: Cetacaine Spray  DESCRIPTION OF PROCEDURE: After the risks benefits and alternatives of the procedure were thoroughly explained, informed consent was obtained.  The Pentax Gastroscope [id number] endoscope was introduced through the mouth and advanced to the second portion of the duodenum , Without limitations.  The instrument was slowly withdrawn as the mucosa was fully examined.  The upper esophageal sphincter was located at a proximally 16 cm. Beginning at 25 cm until the surgical anastomosis (38 cm) there was severe ulcerative esophagitis.  At the anastomosis to surgical stables were visible.  The luminal diameter of the stricture was approximately 10 mm and would not permit passage of the standard endoscope.  A TTS balloon was used to dilate the stricture to 12 mm.  Post dilation there was significant diffuse bleeding from the esophageal mucosa.  No focal lesion.  The bleeding stopped with time, irrigation and observation.  The endoscope easily passed beyond the stricture into the jejunum.  Due to bleeding, no further dilations attempted.  Retroflexion was not performed.     The scope was then withdrawn from the patient and the procedure completed.  COMPLICATIONS: There were no immediate complications.  ENDOSCOPIC IMPRESSION: 1. Severe ulcerative esophagitis 2. 10 mm anastomotic stricture status post balloon dilation to 12 mm 3. Significant post dilation bleeding with spontaneous resolution  as described  RECOMMENDATIONS: 1. Admit to hospital for observation 2. Check CBC with platelets as well as PT/INR (normal in September,question vitamin K deficiency with coagulopathy) 3. Carafate slurries 4 times a day. Clear liquid diet overnight. Recheck CBC in a.m.  REPEAT EXAM:  eSigned:  Eustace Quail, MD 06/26/2014 3:10 PM    TZ:GYFVC Barry Dienes, MD , Julieanne Manson, MD and The Patient

## 2014-07-02 ENCOUNTER — Other Ambulatory Visit: Payer: Self-pay | Admitting: Oncology

## 2014-07-03 ENCOUNTER — Ambulatory Visit: Payer: Medicare Other | Admitting: Nurse Practitioner

## 2014-07-03 ENCOUNTER — Other Ambulatory Visit: Payer: Medicare Other

## 2014-07-04 ENCOUNTER — Ambulatory Visit: Payer: Medicare Other | Admitting: Nutrition

## 2014-07-04 ENCOUNTER — Ambulatory Visit: Payer: Medicare Other

## 2014-07-04 ENCOUNTER — Ambulatory Visit (HOSPITAL_BASED_OUTPATIENT_CLINIC_OR_DEPARTMENT_OTHER): Payer: Medicare Other | Admitting: Nurse Practitioner

## 2014-07-04 ENCOUNTER — Other Ambulatory Visit: Payer: Self-pay | Admitting: *Deleted

## 2014-07-04 ENCOUNTER — Other Ambulatory Visit (HOSPITAL_BASED_OUTPATIENT_CLINIC_OR_DEPARTMENT_OTHER): Payer: Medicare Other

## 2014-07-04 ENCOUNTER — Telehealth: Payer: Self-pay | Admitting: *Deleted

## 2014-07-04 VITALS — BP 111/74 | HR 82 | Temp 97.9°F | Resp 19 | Ht 61.0 in | Wt 93.9 lb

## 2014-07-04 DIAGNOSIS — C159 Malignant neoplasm of esophagus, unspecified: Secondary | ICD-10-CM

## 2014-07-04 DIAGNOSIS — Z95828 Presence of other vascular implants and grafts: Secondary | ICD-10-CM

## 2014-07-04 DIAGNOSIS — E876 Hypokalemia: Secondary | ICD-10-CM

## 2014-07-04 DIAGNOSIS — D701 Agranulocytosis secondary to cancer chemotherapy: Secondary | ICD-10-CM

## 2014-07-04 DIAGNOSIS — C169 Malignant neoplasm of stomach, unspecified: Secondary | ICD-10-CM

## 2014-07-04 DIAGNOSIS — R630 Anorexia: Secondary | ICD-10-CM

## 2014-07-04 DIAGNOSIS — R197 Diarrhea, unspecified: Secondary | ICD-10-CM

## 2014-07-04 DIAGNOSIS — R112 Nausea with vomiting, unspecified: Secondary | ICD-10-CM

## 2014-07-04 DIAGNOSIS — K2210 Ulcer of esophagus without bleeding: Secondary | ICD-10-CM

## 2014-07-04 LAB — CBC WITH DIFFERENTIAL/PLATELET
BASO%: 0.4 % (ref 0.0–2.0)
Basophils Absolute: 0 10*3/uL (ref 0.0–0.1)
EOS%: 0.2 % (ref 0.0–7.0)
Eosinophils Absolute: 0 10*3/uL (ref 0.0–0.5)
HCT: 34.4 % — ABNORMAL LOW (ref 34.8–46.6)
HGB: 10.8 g/dL — ABNORMAL LOW (ref 11.6–15.9)
LYMPH%: 15 % (ref 14.0–49.7)
MCH: 26.9 pg (ref 25.1–34.0)
MCHC: 31.4 g/dL — ABNORMAL LOW (ref 31.5–36.0)
MCV: 85.6 fL (ref 79.5–101.0)
MONO#: 0.8 10*3/uL (ref 0.1–0.9)
MONO%: 7.3 % (ref 0.0–14.0)
NEUT#: 8.9 10*3/uL — ABNORMAL HIGH (ref 1.5–6.5)
NEUT%: 77.1 % — ABNORMAL HIGH (ref 38.4–76.8)
Platelets: 244 10*3/uL (ref 145–400)
RBC: 4.01 10*6/uL (ref 3.70–5.45)
RDW: 18.6 % — ABNORMAL HIGH (ref 11.2–14.5)
WBC: 11.5 10*3/uL — AB (ref 3.9–10.3)
lymph#: 1.7 10*3/uL (ref 0.9–3.3)

## 2014-07-04 LAB — COMPREHENSIVE METABOLIC PANEL (CC13)
ALK PHOS: 64 U/L (ref 40–150)
ALT: 15 U/L (ref 0–55)
ANION GAP: 11 meq/L (ref 3–11)
AST: 23 U/L (ref 5–34)
Albumin: 3.1 g/dL — ABNORMAL LOW (ref 3.5–5.0)
BUN: 13.2 mg/dL (ref 7.0–26.0)
CO2: 22 meq/L (ref 22–29)
CREATININE: 0.8 mg/dL (ref 0.6–1.1)
Calcium: 8.7 mg/dL (ref 8.4–10.4)
Chloride: 106 mEq/L (ref 98–109)
EGFR: 77 mL/min/{1.73_m2} — ABNORMAL LOW (ref >90)
Glucose: 94 mg/dl (ref 70–140)
Potassium: 3.1 mEq/L — ABNORMAL LOW (ref 3.5–5.1)
SODIUM: 140 meq/L (ref 136–145)
TOTAL PROTEIN: 5.8 g/dL — AB (ref 6.4–8.3)
Total Bilirubin: 0.45 mg/dL (ref 0.20–1.20)

## 2014-07-04 LAB — VITAMIN B12: Vitamin B-12: 1890 pg/mL — ABNORMAL HIGH (ref 211–911)

## 2014-07-04 MED ORDER — SODIUM CHLORIDE 0.9 % IJ SOLN
10.0000 mL | INTRAMUSCULAR | Status: DC | PRN
  Administered 2014-07-04: 10 mL via INTRAVENOUS
  Filled 2014-07-04: qty 10

## 2014-07-04 MED ORDER — POTASSIUM CHLORIDE CRYS ER 20 MEQ PO TBCR: 20.0000 meq | EXTENDED_RELEASE_TABLET | Freq: Every day | ORAL | Status: DC

## 2014-07-04 MED ORDER — HEPARIN SOD (PORK) LOCK FLUSH 100 UNIT/ML IV SOLN
500.0000 [IU] | Freq: Once | INTRAVENOUS | Status: AC
  Administered 2014-07-04: 500 [IU] via INTRAVENOUS
  Filled 2014-07-04: qty 5

## 2014-07-04 NOTE — Patient Instructions (Signed)

## 2014-07-04 NOTE — Progress Notes (Signed)
[place] OFFICE PROGRESS NOTE   Diagnosis:  Gastric cancer  INTERVAL HISTORY:   [name] returns as scheduled. She completed cycle 3 FOLFOX 06/19/2014. She underwent an upper endoscopy on 06/26/2014 with findings of severe ulcerative esophagitis, 10 mm anastomotic stricture status post balloon dilatation to 12 mm. She had post dilatation bleeding which resolved spontaneously. She was started on Carafate.  She is feeling stronger since undergoing the dilatation procedure. She denies dysphagia. She is tolerating a soft diet without difficulty. No nausea or vomiting. No mouth sores. No diarrhea. No numbness or tingling in her hands or feet.  She has decided to discontinue further chemotherapy.  Objective:  Vital signs in last 24 hours:  Blood pressure 111/74, pulse 82, temperature 97.9 F (36.6 C), temperature source Oral, resp. rate 19, height 5\' 1"  (1.549 m), weight 93 lb 14.4 oz (42.593 kg).    HEENT: no thrush or ulcers. Resp: lungs clear bilaterally. Cardio: regular rate and rhythm. GI: abdomen soft and nontender. No hepatomegaly. Vascular: no leg edema. Port-A-Cath without erythema.   Lab Results:  Lab Results  Component Value Date   WBC 11.5* 07/04/2014   HGB 10.8* 07/04/2014   HCT 34.4* 07/04/2014   MCV 85.6 07/04/2014   PLT 244 07/04/2014   NEUTROABS 8.9* 07/04/2014    Imaging:  No results found.  Medications: I have reviewed the patient's current medications.  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   Cycle 1 CAPOX beginning 01/02/2014.   Cycle 2 CAPOX beginning 01/23/2014.   Cycle 3 CAPOX beginning 02/13/2014.   Restaging PET scan 02/27/2014 with mild hypermetabolism in the hilar regions with an SUV max on the right of 4.9. Previously seen mild hypermetabolism associated with the distal esophagus not readily identified. Mild uptake in the stomach without a focal lesion or uptake above blood pool.   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  Cycle 1 adjuvant FOLFOX 05/08/2014  Cycle 2 held 05/29/2014 due to neutropenia.  Cycle 2 adjuvant FOLFOX 06/05/2014 with Neulasta support.  Cycle 3 adjuvant FOLFOX 06/19/2014 with Neulasta support. 2. History of gastroesophageal reflux disease and peptic stricture, status post an esophageal dilatation procedure in August 2012 3. History of solid dysphagia secondary to #1.  4. Anxiety. 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 6. Anorexia. Trial of Remeron initiated 05/08/2014. 7. Nausea and vomiting-persistent following FOLFOX 05/08/2014. Improved since beginning Reglan. 8. Neutropenia secondary to chemotherapy, cycle 2 FOLFOX held 05/29/2014. 9. Diarrhea-potentially related to 5-fluorouracil, Reglan, nutrition supplements, and the post gastrectomy state 10. Status post upper endoscopy 06/26/2014 with findings of severe ulcerative esophagitis; 10 mm anastomotic stricture status post balloon dilatation to 12 mm.   Disposition:  Ms.  Lamb appears improved. She has completed 3 cycles of adjuvant FOLFOX. She has decided to discontinue further chemotherapy. She is agreeable to return for a followup visit in 2 weeks. She will contact the office in the interim with any problems.    Ned Card ANP/GNP-BC   07/04/2014  10:29 AM

## 2014-07-04 NOTE — Progress Notes (Signed)
Brief nutrition follow up with patient and husband. Patient is s/p balloon dilatation of esophagus. Patient states she feels much better and has increased oral intake over the past few days. Patient states she believes ENU is giving her diarrhea. Patient has been able to consume more foods. Patient feeling more energetic. Patient states she is going to discuss holding chemotherapy with physician today.  Nutrition diagnosis: Unintended weight loss cannot be evaluated.   Intervention: Educated patient to increase calories and protein in soft, bland foods as tolerated. Teach back method used.  Monitoring, evaluation, goals: Patient will increase intake of cough, bland foods to promote weight gain.  Next visit: Will contact patient by phone or see in person as needed.  Patient has my contact information.  **Disclaimer: This note was dictated with voice recognition software. Similar sounding words can inadvertently be transcribed and this note may contain transcription errors which may not have been corrected upon publication of note.**

## 2014-07-04 NOTE — Telephone Encounter (Signed)
Sent prescription for potassium to patient's pharmacy.  Per Elby Showers. Marcello Moores, NP.

## 2014-07-04 NOTE — Telephone Encounter (Signed)
Patient calling for Vit B12 results.  Informed patient results are not in yet.  Patient verbalized understanding.

## 2014-07-04 NOTE — Progress Notes (Signed)
Port-A-Cath accessed and flushed without any difficulties. Offered to leave Patient accessed for chemotherapy treatment today, but Patient declined stating, "My last two treatments were optional for me, so I'm not going to have treatment today." All labs drawn from port. Port-A-Cath deaccessed and Patient discharged from Flush room without any complaints.

## 2014-07-07 ENCOUNTER — Telehealth: Payer: Self-pay | Admitting: Internal Medicine

## 2014-07-07 NOTE — Telephone Encounter (Signed)
Pt wants to know if she needs to continue taking carafate. Please advise.

## 2014-07-07 NOTE — Telephone Encounter (Signed)
Spoke with pt and she is aware.

## 2014-07-07 NOTE — Telephone Encounter (Signed)
Yes continue carafate slurries indefinitely

## 2014-07-18 ENCOUNTER — Other Ambulatory Visit (HOSPITAL_BASED_OUTPATIENT_CLINIC_OR_DEPARTMENT_OTHER): Payer: Medicare Other

## 2014-07-18 ENCOUNTER — Inpatient Hospital Stay: Payer: Medicare Other

## 2014-07-18 ENCOUNTER — Ambulatory Visit (HOSPITAL_BASED_OUTPATIENT_CLINIC_OR_DEPARTMENT_OTHER): Payer: Medicare Other | Admitting: Oncology

## 2014-07-18 ENCOUNTER — Telehealth: Payer: Self-pay | Admitting: Oncology

## 2014-07-18 VITALS — BP 100/59 | HR 80 | Temp 97.5°F | Resp 18 | Ht 61.0 in | Wt 95.6 lb

## 2014-07-18 DIAGNOSIS — G620 Drug-induced polyneuropathy: Secondary | ICD-10-CM

## 2014-07-18 DIAGNOSIS — D701 Agranulocytosis secondary to cancer chemotherapy: Secondary | ICD-10-CM

## 2014-07-18 DIAGNOSIS — C160 Malignant neoplasm of cardia: Secondary | ICD-10-CM

## 2014-07-18 DIAGNOSIS — C169 Malignant neoplasm of stomach, unspecified: Secondary | ICD-10-CM

## 2014-07-18 LAB — CBC WITH DIFFERENTIAL/PLATELET
BASO%: 0.3 % (ref 0.0–2.0)
BASOS ABS: 0 10*3/uL (ref 0.0–0.1)
EOS%: 0.5 % (ref 0.0–7.0)
Eosinophils Absolute: 0 10*3/uL (ref 0.0–0.5)
HCT: 36.3 % (ref 34.8–46.6)
HGB: 11.9 g/dL (ref 11.6–15.9)
LYMPH%: 17.2 % (ref 14.0–49.7)
MCH: 28.5 pg (ref 25.1–34.0)
MCHC: 32.8 g/dL (ref 31.5–36.0)
MCV: 86.8 fL (ref 79.5–101.0)
MONO#: 0.7 10*3/uL (ref 0.1–0.9)
MONO%: 11.3 % (ref 0.0–14.0)
NEUT#: 4.5 10*3/uL (ref 1.5–6.5)
NEUT%: 70.7 % (ref 38.4–76.8)
Platelets: 195 10*3/uL (ref 145–400)
RBC: 4.18 10*6/uL (ref 3.70–5.45)
RDW: 19.2 % — AB (ref 11.2–14.5)
WBC: 6.4 10*3/uL (ref 3.9–10.3)
lymph#: 1.1 10*3/uL (ref 0.9–3.3)

## 2014-07-18 LAB — COMPREHENSIVE METABOLIC PANEL (CC13)
ALK PHOS: 46 U/L (ref 40–150)
ALT: 10 U/L (ref 0–55)
AST: 18 U/L (ref 5–34)
Albumin: 3 g/dL — ABNORMAL LOW (ref 3.5–5.0)
Anion Gap: 8 mEq/L (ref 3–11)
BUN: 13.9 mg/dL (ref 7.0–26.0)
CALCIUM: 8.6 mg/dL (ref 8.4–10.4)
CHLORIDE: 108 meq/L (ref 98–109)
CO2: 25 mEq/L (ref 22–29)
CREATININE: 0.7 mg/dL (ref 0.6–1.1)
EGFR: 82 mL/min/{1.73_m2} — ABNORMAL LOW (ref >90)
Glucose: 155 mg/dl — ABNORMAL HIGH (ref 70–140)
Potassium: 2.8 mEq/L — CL (ref 3.5–5.1)
Sodium: 142 mEq/L (ref 136–145)
Total Bilirubin: 0.59 mg/dL (ref 0.20–1.20)
Total Protein: 5.5 g/dL — ABNORMAL LOW (ref 6.4–8.3)

## 2014-07-18 LAB — MAGNESIUM (CC13): MAGNESIUM: 2.1 mg/dL (ref 1.5–2.5)

## 2014-07-18 NOTE — Progress Notes (Signed)
  [place] OFFICE PROGRESS NOTE   Diagnosis: Gastroesophageal carcinoma  INTERVAL HISTORY:   Lynn Morris returns as scheduled. She reports improvement in dysphagia since undergoing the esophageal dilatation procedure. No nausea.  Objective:  Vital signs in last 24 hours:  Blood pressure 100/59, pulse 80, temperature 97.5 F (36.4 C), temperature source Oral, resp. rate 18, height 5\' 1"  (1.549 m), weight 95 lb 9.6 oz (43.364 kg), SpO2 100 %.    HEENT: Neck without mass Resp: Lungs clear bilaterally Cardio: Regular rate and rhythm GI: No hepatomegaly, nontender, no mass Vascular: No leg edema   Portacath/PICC-without erythema  Lab Results:  Lab Results  Component Value Date   WBC 6.4 07/18/2014   HGB 11.9 07/18/2014   HCT 36.3 07/18/2014   MCV 86.8 07/18/2014   PLT 195 07/18/2014   NEUTROABS 4.5 07/18/2014   Potassium 2.8 Magnesium 2.1  Lab Results  Component Value Date   CEA 0.7 12/20/2013    Medications: I have reviewed the patient's current medications.  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   Cycle 1 CAPOX beginning 01/02/2014.    Cycle 2 CAPOX beginning 01/23/2014.   Cycle 3 CAPOX beginning 02/13/2014.   Restaging PET scan 02/27/2014 with mild hypermetabolism in the hilar regions with an SUV max on the right of 4.9. Previously seen mild hypermetabolism associated with the distal esophagus not readily identified. Mild uptake in the stomach without a focal lesion or uptake above blood pool.   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  Cycle 1 adjuvant FOLFOX 05/08/2014  Cycle 2 held 05/29/2014 due to neutropenia.  Cycle 2 adjuvant FOLFOX 06/05/2014 with Neulasta support.  Cycle 3 adjuvant FOLFOX 06/19/2014 with Neulasta support. 2. History of gastroesophageal reflux disease and peptic stricture, status post an esophageal dilatation procedure in August 2012 3. History of solid dysphagia secondary to #1.  4. Anxiety. 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 6. Anorexia. Trial of Remeron initiated 05/08/2014. 7. Nausea and vomiting-persistent following FOLFOX 05/08/2014. Improved since beginning Reglan. 8. Neutropenia secondary to chemotherapy, cycle 2 FOLFOX held 05/29/2014. 9. Status post upper endoscopy 06/26/2014 with findings of severe ulcerative esophagitis; 10 mm anastomotic stricture status post balloon dilatation to 12 mm.     Disposition:  Her performance status has improved since undergoing the esophageal dilatation procedure. She has decided against further chemotherapy. She will discontinue Reglan. We increased the potassium to twice daily.  Lynn Morris will return for an office visit and Port-A-Cath flush 08/15/2014.   Betsy Coder, MD  07/18/2014  7:31 PM

## 2014-07-18 NOTE — Telephone Encounter (Signed)
gv and printed appt sched and avs for pt for Jan 2016 °

## 2014-07-26 ENCOUNTER — Ambulatory Visit (INDEPENDENT_AMBULATORY_CARE_PROVIDER_SITE_OTHER): Payer: Medicare Other | Admitting: Internal Medicine

## 2014-07-26 ENCOUNTER — Telehealth: Payer: Self-pay | Admitting: Internal Medicine

## 2014-07-26 ENCOUNTER — Encounter: Payer: Self-pay | Admitting: Internal Medicine

## 2014-07-26 ENCOUNTER — Telehealth: Payer: Self-pay

## 2014-07-26 VITALS — BP 90/52 | HR 64 | Ht 60.5 in | Wt 95.5 lb

## 2014-07-26 DIAGNOSIS — K209 Esophagitis, unspecified without bleeding: Secondary | ICD-10-CM

## 2014-07-26 DIAGNOSIS — R1310 Dysphagia, unspecified: Secondary | ICD-10-CM

## 2014-07-26 DIAGNOSIS — C169 Malignant neoplasm of stomach, unspecified: Secondary | ICD-10-CM

## 2014-07-26 DIAGNOSIS — K222 Esophageal obstruction: Secondary | ICD-10-CM

## 2014-07-26 MED ORDER — SUCRALFATE 1 GM/10ML PO SUSP: 1.0000 g | Freq: Four times a day (QID) | ORAL | Status: DC

## 2014-07-26 MED ORDER — FAMOTIDINE 20 MG PO TABS: 20.0000 mg | ORAL_TABLET | Freq: Two times a day (BID) | ORAL | Status: DC

## 2014-07-26 NOTE — Telephone Encounter (Signed)
Spoke with patient who decided she is going to try Mylanta for a couple of weeks until her egd at the hospital.  I told her to call me if she needed anything in the interim.  Patient agreed.

## 2014-07-26 NOTE — Progress Notes (Signed)
HISTORY OF PRESENT ILLNESS:  Lynn Morris is a 74 y.o. female with a history of gastric cancer diagnosed in May 2015 status post total gastrectomy with ileal pouch and ileum esophageal anastomosis. Had been receiving chemotherapy. Was evaluated for severe dysphagia postoperatively and underwent upper endoscopy 06/26/2014. She was found to have severe ulcerative esophagitis. A 10 mm anastomotic picture which was dilated to 12 mm. Post dilation significant bleeding for which she was admitted to the hospital for observation and did fine. Since that time, she has been on Carafate slurries 4 times daily. She presents today for routine follow-up. The patient is accompanied by her husband. Since her dilation, she reports marked improvement in swallowing. However, still with intermittent solid food dysphagia when not chewing her food well. No vomiting. Less burning discomfort on Carafate. Complains about the cost of Carafate being $95 every 2 weeks. Has had improved strength and several pound weight gain. Her last visit with oncology she decided against further chemotherapy. No new complaints.  REVIEW OF SYSTEMS:  All non-GI ROS negative except for anxiety  Past Medical History  Diagnosis Date  . Esophageal stricture   . Hyperlipemia   . Arthritis     LG TOE  . Cataract     right eye and immature  . Complication of anesthesia   . PONV (postoperative nausea and vomiting)   . Hiatal hernia 12-13-13    intermittent nausea with vomiting  . History of blood transfusion     no abnormal reaction noted  . Pneumonia     touch of it many yrs ago(>41yrs)  . History of bronchitis > 59yrs ago  . History of migraine     many yrs ago  . Esophageal reflux     was on Prilosec;has been off a yr  . Diverticulosis   . Anxiety     takes Zoloft every other day  . Optic neuritis     many yrs ago  . Cancer 11/24/13    gastroesophageal carcinoma    Past Surgical History  Procedure Laterality Date  . Abdominal  hysterectomy  1978    Partial   . Cesarean section  1964 & 1966  . Nose surgery  1970  . Appendectomy    . Abdominal hysterectomy    . Eus N/A 12/22/2013    Procedure: UPPER ENDOSCOPIC ULTRASOUND (EUS) LINEAR;  Surgeon: Milus Banister, MD;  Location: [facility];  Service: Endoscopy;  Laterality: N/A;  . Portacath placement N/A 12/27/2013    Procedure: INSERTION PORT-A-CATH;  Surgeon: Stark Klein, MD;  Location: [facility];  Service: General;  Laterality: N/A;  . Esophagogastroduodenoscopy      with dilitation  . Colonoscopy    . Laparoscopy N/A 04/04/2014    Procedure: DIAGNOSTIC LAPAROSCOPY;  Surgeon: Stark Klein, MD;  Location: [facility];  Service: General;  Laterality: N/A;  . Partial gastrectomy N/A 04/04/2014    Procedure: TOTAL GASTRECTOMY;  Surgeon: Stark Klein, MD;  Location: [facility];  Service: General;  Laterality: N/A;  . Jejunostomy N/A 04/04/2014    Procedure: ESOPHAGOJEJUNOSTOMY AND FEEDING JEJUNOSTOMY;  Surgeon: Stark Klein, MD;  Location: [facility];  Service: General;  Laterality: N/A;  . Esophagogastroduodenoscopy (egd) with propofol N/A 06/26/2014    Procedure: ESOPHAGOGASTRODUODENOSCOPY (EGD) WITH PROPOFOL;  Surgeon: Irene Shipper, MD;  Location: [facility];  Service: Endoscopy;  Laterality: N/A;  . Balloon dilation N/A 06/26/2014    Procedure: BALLOON DILATION;  Surgeon: Irene Shipper, MD;  Location: [facility];  Service:  Endoscopy;  Laterality: N/A;    Social History [name]  reports that she has never smoked. She has never used smokeless tobacco. She reports that she does not drink alcohol or use illicit drugs.  family history includes Diabetes in her mother; Hypertension in her mother; Pancreatic cancer in her mother. There is no history of Colon cancer.  Allergies  Allergen Reactions  . Codeine     Unknown- patient does not remember       PHYSICAL EXAMINATION: Vital signs: BP 90/52 mmHg  Pulse 64  Ht 5' 0.5" (1.537 m)  Wt 95 lb 8 oz (43.319 kg)  BMI 18.34  kg/m2 General: Well-developed, well-nourished, no acute distress HEENT: Sclerae are anicteric, conjunctiva pink. Oral mucosa intact Lungs: Clear Heart: Regular Abdomen: soft, nontender, nondistended, no obvious ascites, no peritoneal signs, normal bowel sounds. No organomegaly. Prior surgical incision well-healed Extremities: No edema Psychiatric: alert and oriented x3. Cooperative   ASSESSMENT:  #1. Gastric cancer status post total gastrectomy #2. Anastomotic stricture as described. Status post balloon dilation with significant post procedure bleeding requiring observation. The patient's swallowing has improved, though not to her satisfaction. She is interested in relook endoscopy with esophageal dilation #3. Ulcerative esophagitis   PLAN:  #1. Continue Carafate slurries #2. Repeat EGD with balloon dilation of the anastomosis.The nature of the procedure, as well as the risks, benefits, and alternatives were carefully and thoroughly reviewed with the patient. Ample time for discussion and questions allowed. The patient understood, was satisfied, and agreed to proceed. #3. Ongoing neoplasia surveillance with oncology

## 2014-07-26 NOTE — Patient Instructions (Signed)
You have been scheduled for an endoscopy with balloon dilation at [facility].  Please follow the written instructions given to you at your visit today.  If you use inhalers (even only as needed), please bring them with you on the day of your procedure.  I will call your pharmacy to see if there are any less expensive options to your Carafate.  I will call you with this information.

## 2014-07-26 NOTE — Telephone Encounter (Signed)
Called pharmacist and asked if there were any alternatives to Carafate that were not as cost prohibitive to the patient.  The pharmacist said there is no less expensive alternative but suggested the patient try Maalox or Mylanta over the counter, which basically does the same thing, coating the esophagus and throat.  Called patient and left message relaying this information.

## 2014-08-01 ENCOUNTER — Telehealth: Payer: Self-pay | Admitting: *Deleted

## 2014-08-01 ENCOUNTER — Ambulatory Visit (HOSPITAL_BASED_OUTPATIENT_CLINIC_OR_DEPARTMENT_OTHER): Payer: Medicare Other | Admitting: Nurse Practitioner

## 2014-08-01 ENCOUNTER — Other Ambulatory Visit: Payer: Self-pay | Admitting: Nurse Practitioner

## 2014-08-01 VITALS — BP 94/71 | HR 75 | Temp 97.8°F | Resp 17 | Ht 60.5 in | Wt 88.1 lb

## 2014-08-01 DIAGNOSIS — W19XXXA Unspecified fall, initial encounter: Secondary | ICD-10-CM

## 2014-08-01 DIAGNOSIS — E860 Dehydration: Secondary | ICD-10-CM

## 2014-08-01 DIAGNOSIS — R42 Dizziness and giddiness: Secondary | ICD-10-CM

## 2014-08-01 DIAGNOSIS — C169 Malignant neoplasm of stomach, unspecified: Secondary | ICD-10-CM

## 2014-08-01 DIAGNOSIS — I951 Orthostatic hypotension: Secondary | ICD-10-CM

## 2014-08-01 MED ORDER — HEPARIN SOD (PORK) LOCK FLUSH 100 UNIT/ML IV SOLN
500.0000 [IU] | Freq: Once | INTRAVENOUS | Status: AC
  Administered 2014-08-01: 500 [IU] via INTRAVENOUS
  Filled 2014-08-01: qty 5

## 2014-08-01 MED ORDER — SODIUM CHLORIDE 0.9 % IV SOLN
INTRAVENOUS | Status: AC
  Administered 2014-08-01: 18:00:00 via INTRAVENOUS

## 2014-08-01 MED ORDER — SODIUM CHLORIDE 0.9 % IJ SOLN
10.0000 mL | Freq: Once | INTRAMUSCULAR | Status: AC
  Administered 2014-08-01: 10 mL via INTRAVENOUS
  Filled 2014-08-01: qty 10

## 2014-08-01 NOTE — Telephone Encounter (Signed)
Pt called reports:  "I've fallen for the past 2 days now; should I come in to get checked out?"  Pt states that when she gets up from her afternoon nap; gets dizzy and then falls out of the bed.  Pt states she does not have any means to check her blood pressure at home; denies diarrhea/nausea/fever; states she has a bruise on her back. Per Dr. Benay Spice; notified pt to come into our [facility] for evaluation.  Pt verbalized understanding and is on her way.

## 2014-08-01 NOTE — Patient Instructions (Signed)
Dehydration Dehydration means your body does not have as much fluid as it needs. Your kidneys, brain, and heart will not work properly without the right amount of fluids and salt. Older adults are more likely to become dehydrated than younger adults. This is because:   Their bodies do not hold water as well.  Their bodies do not respond to temperature changes as well.  They do not get thirsty as easily or as quickly. HOME CARE  Ask your doctor how to replace body fluid losses (rehydrate).  Drink enough fluids to keep your pee (urine) clear or pale yellow.  Drink small amounts of fluids often if you feel sick to your stomach (nauseous) or throw up (vomit).  Eat like you normally do.  Avoid:  Foods or drinks high in sugar.  Bubbly (carbonated) drinks.  Juice.  Very hot or cold fluids.  Drinks with caffeine.  Fatty, greasy foods.  Alcohol.  Tobacco.  Eating too much.  Gelatin desserts.  Wash your hands to avoid spreading germs (bacteria, viruses).  Only take medicine as told by your doctor.  Keep all doctor visits as told. GET HELP IF:  You have belly (abdominal) pain that gets worse or stays in one spot (localizes).  You have a rash, stiff neck, or bad headache.  You get easily annoyed, sleepy, or are hard to wake up.  You feel weak, dizzy, or very thirsty.  You have a fever. GET HELP RIGHT AWAY IF:   You cannot drink fluid without throwing up.  You get worse even with treatment.  You throw up often.  You have watery poop (diarrhea) often.  Your vomit has blood in it or looks greenish.  Your poop (stool) has blood in it or looks black and tarry.  You have not peed in 6 to 8 hours or have only peed a small amount of very dark pee.  You pass out (faint). MAKE SURE YOU:   Understand these instructions.  Will watch your condition.  Will get help right away if you are not doing well or get worse. Document Released: 06/26/2011 Document Revised:  07/12/2013 Document Reviewed: 03/14/2013 Exit[employer] Patient Information 2015 [employer]. This information is not intended to replace advice given to you by your health care provider. Make sure you discuss any questions you have with your health care provider.  

## 2014-08-02 ENCOUNTER — Other Ambulatory Visit: Payer: Self-pay | Admitting: *Deleted

## 2014-08-02 ENCOUNTER — Telehealth: Payer: Self-pay | Admitting: Nurse Practitioner

## 2014-08-02 ENCOUNTER — Encounter: Payer: Self-pay | Admitting: Nurse Practitioner

## 2014-08-02 ENCOUNTER — Encounter (HOSPITAL_COMMUNITY): Payer: Self-pay | Admitting: *Deleted

## 2014-08-02 DIAGNOSIS — I951 Orthostatic hypotension: Secondary | ICD-10-CM | POA: Insufficient documentation

## 2014-08-02 DIAGNOSIS — W19XXXA Unspecified fall, initial encounter: Secondary | ICD-10-CM | POA: Insufficient documentation

## 2014-08-02 MED ORDER — SODIUM CHLORIDE 0.9 % IV SOLN: INTRAVENOUS | Status: DC

## 2014-08-02 NOTE — Assessment & Plan Note (Signed)
Patient completed FOLFOX chemotherapy regimen on 06/19/2014.  She continues on observation only.  Also, patient has plans for a repeat esophageal irritation procedure on 08/08/2014.  Patient will return on Thursday, 08/03/2014 for labs, follow up visit, and probable additional IV fluid rehydration.  She also has a appointment for labs and a follow-up visit on 08/15/2014.

## 2014-08-02 NOTE — Assessment & Plan Note (Signed)
Patient complaining of some dizziness with sudden position changes.  Patient was positive for orthostatic hypotension with lowest blood pressure 77/58 when standing.  Patient does appear mildly dehydrated today.  Will give patient 1 L normal saline IV fluid rehydration today to see if this helps.

## 2014-08-02 NOTE — Progress Notes (Signed)
will   SYMPTOM MANAGEMENT CLINIC   HPI: Lynn Morris 74 y.o. female diagnosed with gastroesophageal cancer.  Patient is status post FOLFOX chemotherapy.  Currently under observation only.  Patient called the cancer Center today requesting urgent care visit.  She is reporting dizziness with sudden position changes.  She reports that she has fallen twice within the past 24 hours.  She denies hitting her head or any loss of consciousness.  She states she has some mild tenderness to her low back only from the fall.  Patient has little appetite; but states that she tries to drink as much fluid as possible.  She denies any nausea, vomiting, constipation, or diarrhea.  She denies any UTI symptoms.  She denies any recent fevers or chills.   HPI   07/04/2014      SCHEDULING COMMUNICATION      fosaprepitant (EMEND) 150 mg in sodium chloride 0.9 % 145 mL IVPB      palonosetron (ALOXI) injection 0.25 mg      dexamethasone (DECADRON) injection 10 mg      oxaliplatin (ELOXATIN) 125 mg in dextrose 5 % 500 mL chemo infusion      leucovorin 584 mg in dextrose 5 % 250 mL infusion      fluorouracil (ADRUCIL) chemo injection 600 mg      fluorouracil (ADRUCIL) 3,500 mg in sodium chloride 0.9 % 150 mL chemo infusion      sodium chloride 0.9 % injection 10 mL      heparin lock flush 100 unit/mL      heparin lock flush 100 unit/mL      alteplase (CATHFLO ACTIVASE) injection 2 mg      sodium chloride 0.9 % injection 3 mL      Cold Pack 1 packet      Hot Pack 1 packet      dextrose 5 % solution      TREATMENT CONDITIONS 07/06/2014      pegfilgrastim (NEULASTA) injection 6 mg      SCHEDULING COMMUNICATION      sodium chloride 0.9 % injection 10 mL      heparin lock flush 100 unit/mL      heparin lock flush 100 unit/mL      alteplase (CATHFLO ACTIVASE) injection 2 mg      sodium chloride 0.9 % injection 3 mL      Cold Pack 1 packet 07/19/2014      SCHEDULING COMMUNICATION      fosaprepitant (EMEND) 150  mg in sodium chloride 0.9 % 145 mL IVPB      palonosetron (ALOXI) injection 0.25 mg      dexamethasone (DECADRON) injection 10 mg      oxaliplatin (ELOXATIN) 125 mg in dextrose 5 % 500 mL chemo infusion      leucovorin 584 mg in dextrose 5 % 250 mL infusion      fluorouracil (ADRUCIL) chemo injection 600 mg      fluorouracil (ADRUCIL) 3,500 mg in sodium chloride 0.9 % 150 mL chemo infusion      sodium chloride 0.9 % injection 10 mL      heparin lock flush 100 unit/mL      heparin lock flush 100 unit/mL      alteplase (CATHFLO ACTIVASE) injection 2 mg      sodium chloride 0.9 % injection 3 mL      Cold Pack 1 packet      Hot Pack 1 packet      dextrose 5 %  solution      TREATMENT CONDITIONS    ROS  Past Medical History  Diagnosis Date  . Esophageal stricture   . Hyperlipemia   . Arthritis     LG TOE  . Cataract     right eye and immature  . Complication of anesthesia   . PONV (postoperative nausea and vomiting)   . Hiatal hernia 12-13-13    intermittent nausea with vomiting  . History of blood transfusion     no abnormal reaction noted  . Pneumonia     touch of it many yrs ago(>5yr)  . History of bronchitis > 89yrago  . History of migraine     many yrs ago  . Esophageal reflux     was on Prilosec;has been off a yr  . Diverticulosis   . Anxiety     takes Zoloft every other day  . Optic neuritis     many yrs ago  . Cancer 11/24/13    gastroesophageal carcinoma    Past Surgical History  Procedure Laterality Date  . Abdominal hysterectomy  1978    Partial   . Cesarean section  1964 & 1966  . Nose surgery  1970  . Appendectomy    . Abdominal hysterectomy    . Eus N/A 12/22/2013    Procedure: UPPER ENDOSCOPIC ULTRASOUND (EUS) LINEAR;  Surgeon: DaMilus BanisterMD;  Location: [facility];  Service: Endoscopy;  Laterality: N/A;  . Portacath placement N/A 12/27/2013    Procedure: INSERTION PORT-A-CATH;  Surgeon: FaStark KleinMD;  [place] [facility]: [facility];   Laterality: N/A;  . Esophagogastroduodenoscopy      with dilitation  . Colonoscopy    . Laparoscopy N/A 04/04/2014    Procedure: DIAGNOSTIC LAPAROSCOPY;  Surgeon: FaStark KleinMD;  [place] [facility]: [facility];  Laterality: N/A;  . Partial gastrectomy N/A 04/04/2014    Procedure: TOTAL GASTRECTOMY;  Surgeon: FaStark KleinMD;  [place] [facility]: [facility];  Laterality: N/A;  . Jejunostomy N/A 04/04/2014    Procedure: ESOPHAGOJEJUNOSTOMY AND FEEDING JEJUNOSTOMY;  Surgeon: FaStark KleinMD;  [facility]: [facility];  Laterality: N/A;  . Esophagogastroduodenoscopy (egd) with propofol N/A 06/26/2014    Procedure: ESOPHAGOGASTRODUODENOSCOPY (EGD) WITH PROPOFOL;  Surgeon: JoIrene ShipperMD;  Location: [facility];  Service: Endoscopy;  Laterality: N/A;  . Balloon dilation N/A 06/26/2014    Procedure: BALLOON DILATION;  Surgeon: JoIrene ShipperMD;  Location: [facility];  Service: Endoscopy;  Laterality: N/A;    has Gastric cancer; Anxiety and depression; Allergic rhinitis; Acid reflux; HLD (hyperlipidemia); Osteopenia; Avitaminosis D; Dermatitis seborrheica; Chronic recurrent major depressive disorder; Nausea with vomiting; Dehydration; Esophageal stricture; Acute esophagitis; Upper GI bleeding; Orthostatic hypotension; and Fall on her problem list.     is allergic to codeine.    Medication List       This list is accurate as of: 08/01/14 11:59 PM.  Always use your most recent med list.               aluminum & magnesium hydroxide-simethicone 500-450-40 MG/5ML suspension  Commonly known as:  MYLANTA  Take 10 mLs by mouth 4 (four) times daily.     famotidine 20 MG tablet  Commonly known as:  PEPCID  Take 1 tablet (20 mg total) by mouth 2 (two) times daily.     KLOR-CON M20 20 MEQ tablet  Generic drug:  potassium chloride SA  TAKE 1 TABLET BY MOUTH EVERY DAY  lidocaine-prilocaine cream  Commonly known as:  EMLA  Apply to port a cath site one hour prior to use.  Cover with plastic. Do not rub in.     megestrol 40 MG/ML suspension  Commonly known as:  MEGACE  Take 5 mLs (200 mg total) by mouth 2 (two) times daily.     metoCLOPramide 10 MG tablet  Commonly known as:  REGLAN  Take 1 tablet (10 mg total) by mouth 3 (three) times daily before meals.     mirtazapine 30 MG tablet  Commonly known as:  REMERON  Take 1 tablet (30 mg total) by mouth at bedtime.     ondansetron 8 MG tablet  Commonly known as:  ZOFRAN  Take 1 tablet (8 mg total) by mouth every 8 (eight) hours as needed for nausea or vomiting.     PRESCRIPTION MEDICATION  Chemo at [facility]     sertraline 100 MG tablet  Commonly known as:  ZOLOFT  Take 100 mg by mouth every other day.     sucralfate 1 GM/10ML suspension  Commonly known as:  CARAFATE  Take 10 mLs (1 g total) by mouth 4 (four) times daily.     Vitamin D3 2000 UNITS Tabs  Take 1 tablet by mouth.         PHYSICAL EXAMINATION  Blood pressure 94/71, pulse 75, temperature 97.8 F (36.6 C), temperature source Oral, resp. rate 17, height 5' 0.5" (1.537 m), weight 88 lb 1.6 oz (39.962 kg), SpO2 99 %.  Physical Exam  Constitutional: She is oriented to person, place, and time. She appears malnourished and dehydrated. She appears unhealthy. She appears cachectic.  Very frail, thin on exam.  HENT:  Head: Normocephalic and atraumatic.  Mouth/Throat: Oropharynx is clear and moist.  Eyes: Conjunctivae and EOM are normal. Pupils are equal, round, and reactive to light. Right eye exhibits no discharge. Left eye exhibits no discharge. No scleral icterus.  Neck: Normal range of motion. Neck supple. No JVD present. No tracheal deviation present. No thyromegaly present.  Cardiovascular: Normal rate, regular rhythm, normal heart sounds and intact distal pulses.   Pulmonary/Chest: Effort normal and breath sounds normal. No respiratory distress. She has no wheezes. She has no rales. She exhibits no tenderness.  Abdominal: Soft. Bowel  sounds are normal. She exhibits no distension and no mass. There is no tenderness. There is no rebound and no guarding.  Musculoskeletal: Normal range of motion. She exhibits no edema or tenderness.  Lymphadenopathy:    She has no cervical adenopathy.  Neurological: She is alert and oriented to person, place, and time. Gait normal.  Skin: Skin is warm and dry. No rash noted. No erythema. There is pallor.  Psychiatric: Affect normal.  Nursing note and vitals reviewed.   LABORATORY DATA:. No visits with results within 3 Day(s) from this visit. Latest known visit with results is:  Appointment on 07/18/2014  Component Date Value Ref Range Status  . WBC 07/18/2014 6.4  3.9 - 10.3 10e3/uL Final  . NEUT# 07/18/2014 4.5  1.5 - 6.5 10e3/uL Final  . HGB 07/18/2014 11.9  11.6 - 15.9 g/dL Final  . HCT 07/18/2014 36.3  34.8 - 46.6 % Final  . Platelets 07/18/2014 195  145 - 400 10e3/uL Final  . MCV 07/18/2014 86.8  79.5 - 101.0 fL Final  . MCH 07/18/2014 28.5  25.1 - 34.0 pg Final  . MCHC 07/18/2014 32.8  31.5 - 36.0 g/dL Final  . RBC 07/18/2014 4.18  3.70 -  5.45 10e6/uL Final  . RDW 07/18/2014 19.2* 11.2 - 14.5 % Final  . lymph# 07/18/2014 1.1  0.9 - 3.3 10e3/uL Final  . MONO# 07/18/2014 0.7  0.1 - 0.9 10e3/uL Final  . Eosinophils Absolute 07/18/2014 0.0  0.0 - 0.5 10e3/uL Final  . Basophils Absolute 07/18/2014 0.0  0.0 - 0.1 10e3/uL Final  . NEUT% 07/18/2014 70.7  38.4 - 76.8 % Final  . LYMPH% 07/18/2014 17.2  14.0 - 49.7 % Final  . MONO% 07/18/2014 11.3  0.0 - 14.0 % Final  . EOS% 07/18/2014 0.5  0.0 - 7.0 % Final  . BASO% 07/18/2014 0.3  0.0 - 2.0 % Final  . Sodium 07/18/2014 142  136 - 145 mEq/L Final  . Potassium 07/18/2014 2.8* 3.5 - 5.1 mEq/L Final  . Chloride 07/18/2014 108  98 - 109 mEq/L Final  . CO2 07/18/2014 25  22 - 29 mEq/L Final  . Glucose 07/18/2014 155* 70 - 140 mg/dl Final  . BUN 07/18/2014 13.9  7.0 - 26.0 mg/dL Final  . Creatinine 07/18/2014 0.7  0.6 - 1.1 mg/dL Final   . Total Bilirubin 07/18/2014 0.59  0.20 - 1.20 mg/dL Final  . Alkaline Phosphatase 07/18/2014 46  40 - 150 U/L Final  . AST 07/18/2014 18  5 - 34 U/L Final  . ALT 07/18/2014 10  0 - 55 U/L Final  . Total Protein 07/18/2014 5.5* 6.4 - 8.3 g/dL Final  . Albumin 07/18/2014 3.0* 3.5 - 5.0 g/dL Final  . Calcium 07/18/2014 8.6  8.4 - 10.4 mg/dL Final  . Anion Gap 07/18/2014 8  3 - 11 mEq/L Final  . EGFR 07/18/2014 82* >90 ml/min/1.73 m2 Final   eGFR is calculated using the CKD-EPI Creatinine Equation (2009)  . Magnesium 07/18/2014 2.1  1.5 - 2.5 mg/dl Final     RADIOGRAPHIC STUDIES: No results found.  ASSESSMENT/PLAN:    Dehydration Patient complaining of some dizziness with sudden position changes.  Patient was positive for orthostatic hypotension with lowest blood pressure 77/58 when standing.  Patient does appear mildly dehydrated today.  Will give patient 1 L normal saline IV fluid rehydration today to see if this helps.   Fall Patient complaining of dizziness with sudden position changes.  She actually fell twice within the past 24 hours due to this dizziness.  She is complaining of some mild tenderness to her low back; but states she had no other injuries.  Denies hitting her head or any loss of consciousness.  No evidence of bruise to back on exam.  Patient observed with full range of motion; and ambulate with no difficulty.   Gastric cancer Patient completed FOLFOX chemotherapy regimen on 06/19/2014.  She continues on observation only.  Also, patient has plans for a repeat esophageal irritation procedure on 08/08/2014.  Patient will return on Thursday, 08/03/2014 for labs, follow up visit, and probable additional IV fluid rehydration.  She also has a appointment for labs and a follow-up visit on 08/15/2014.   Orthostatic hypotension Patient is complaining of in creased dizziness with sudden position changes.  Orthostatic vital signs revealed a lying blood pressure of 103/60.  A  sitting blood pressure 97/58, and a standing blood pressure of 77/58.  Patient denied taking any blood pressure medications whatsoever.  Patient was given 1 L normal saline IV fluid rehydration while at the [place] today.  Also advised patient to purchase a blood pressure cuff on the way home tonight; and to start taking her blood pressure at  home.  Advised patient would call and review all blood pressure readings with her tomorrow to see if there is been any improvement.  Also advised patient to ambulate about her home carefully; and also to change positions slowly to prevent any further falls.   Patient stated understanding of all instructions; and was in agreement with this plan of care. The patient knows to call the clinic with any problems, questions or concerns.   Review/collaboration with Dr. Benay Spice regarding all aspects of patient's visit today.   Total time spent with patient was 25 minutes;  with greater than 75 percent of that time spent in face to face counseling regarding her symptoms, and coordination of care and follow up.  Disclaimer: This note was dictated with voice recognition software. Similar sounding words can inadvertently be transcribed and may not be corrected upon review.   Drue Second, NP 08/02/2014

## 2014-08-02 NOTE — Assessment & Plan Note (Signed)
Patient complaining of dizziness with sudden position changes.  She actually fell twice within the past 24 hours due to this dizziness.  She is complaining of some mild tenderness to her low back; but states she had no other injuries.  Denies hitting her head or any loss of consciousness.  No evidence of bruise to back on exam.  Patient observed with full range of motion; and ambulate with no difficulty.

## 2014-08-02 NOTE — Telephone Encounter (Signed)
Confirm appt for 08/03/14

## 2014-08-02 NOTE — Assessment & Plan Note (Signed)
Patient is complaining of in creased dizziness with sudden position changes.  Orthostatic vital signs revealed a lying blood pressure of 103/60.  A sitting blood pressure 97/58, and a standing blood pressure of 77/58.  Patient denied taking any blood pressure medications whatsoever.  Patient was given 1 L normal saline IV fluid rehydration while at the [place] today.  Also advised patient to purchase a blood pressure cuff on the way home tonight; and to start taking her blood pressure at home.  Advised patient would call and review all blood pressure readings with her tomorrow to see if there is been any improvement.  Also advised patient to ambulate about her home carefully; and also to change positions slowly to prevent any further falls.

## 2014-08-03 ENCOUNTER — Other Ambulatory Visit (HOSPITAL_BASED_OUTPATIENT_CLINIC_OR_DEPARTMENT_OTHER): Payer: Medicare Other

## 2014-08-03 ENCOUNTER — Ambulatory Visit (HOSPITAL_BASED_OUTPATIENT_CLINIC_OR_DEPARTMENT_OTHER): Payer: Medicare Other

## 2014-08-03 ENCOUNTER — Other Ambulatory Visit: Payer: Self-pay | Admitting: Nurse Practitioner

## 2014-08-03 ENCOUNTER — Ambulatory Visit: Payer: Medicare Other | Admitting: Nutrition

## 2014-08-03 ENCOUNTER — Ambulatory Visit (HOSPITAL_BASED_OUTPATIENT_CLINIC_OR_DEPARTMENT_OTHER): Payer: Medicare Other | Admitting: Nurse Practitioner

## 2014-08-03 VITALS — BP 85/58 | HR 93

## 2014-08-03 VITALS — BP 76/53 | HR 82 | Temp 97.8°F | Resp 18 | Ht 60.5 in | Wt 89.3 lb

## 2014-08-03 DIAGNOSIS — I951 Orthostatic hypotension: Secondary | ICD-10-CM

## 2014-08-03 DIAGNOSIS — E860 Dehydration: Secondary | ICD-10-CM | POA: Diagnosis not present

## 2014-08-03 DIAGNOSIS — C169 Malignant neoplasm of stomach, unspecified: Secondary | ICD-10-CM

## 2014-08-03 DIAGNOSIS — R42 Dizziness and giddiness: Secondary | ICD-10-CM

## 2014-08-03 LAB — CBC WITH DIFFERENTIAL/PLATELET
BASO%: 0.4 % (ref 0.0–2.0)
Basophils Absolute: 0 10*3/uL (ref 0.0–0.1)
EOS%: 2 % (ref 0.0–7.0)
Eosinophils Absolute: 0.1 10*3/uL (ref 0.0–0.5)
HCT: 37.9 % (ref 34.8–46.6)
HEMOGLOBIN: 12.1 g/dL (ref 11.6–15.9)
LYMPH%: 41.2 % (ref 14.0–49.7)
MCH: 28.2 pg (ref 25.1–34.0)
MCHC: 31.9 g/dL (ref 31.5–36.0)
MCV: 88.5 fL (ref 79.5–101.0)
MONO#: 0.4 10*3/uL (ref 0.1–0.9)
MONO%: 9.4 % (ref 0.0–14.0)
NEUT#: 1.9 10*3/uL (ref 1.5–6.5)
NEUT%: 47 % (ref 38.4–76.8)
Platelets: 196 10*3/uL (ref 145–400)
RBC: 4.28 10*6/uL (ref 3.70–5.45)
RDW: 17.8 % — ABNORMAL HIGH (ref 11.2–14.5)
WBC: 4 10*3/uL (ref 3.9–10.3)
lymph#: 1.6 10*3/uL (ref 0.9–3.3)

## 2014-08-03 LAB — URINALYSIS, MICROSCOPIC - CHCC
BILIRUBIN (URINE): NEGATIVE
Blood: NEGATIVE
Glucose: NEGATIVE mg/dL
Ketones: NEGATIVE mg/dL
NITRITE: NEGATIVE
Protein: NEGATIVE mg/dL
RBC / HPF: NEGATIVE (ref 0–2)
Specific Gravity, Urine: 1.015 (ref 1.003–1.035)
Urobilinogen, UR: 0.2 mg/dL (ref 0.2–1)
pH: 6.5 (ref 4.6–8.0)

## 2014-08-03 LAB — COMPREHENSIVE METABOLIC PANEL (CC13)
ALT: 7 U/L (ref 0–55)
AST: 15 U/L (ref 5–34)
Albumin: 3.1 g/dL — ABNORMAL LOW (ref 3.5–5.0)
Alkaline Phosphatase: 40 U/L (ref 40–150)
Anion Gap: 4 mEq/L (ref 3–11)
BILIRUBIN TOTAL: 0.5 mg/dL (ref 0.20–1.20)
BUN: 12.4 mg/dL (ref 7.0–26.0)
CO2: 29 meq/L (ref 22–29)
Calcium: 8.8 mg/dL (ref 8.4–10.4)
Chloride: 105 mEq/L (ref 98–109)
Creatinine: 0.7 mg/dL (ref 0.6–1.1)
EGFR: 81 mL/min/{1.73_m2} — AB (ref >90)
Glucose: 90 mg/dl (ref 70–140)
Potassium: 4.2 mEq/L (ref 3.5–5.1)
Sodium: 138 mEq/L (ref 136–145)
TOTAL PROTEIN: 5.8 g/dL — AB (ref 6.4–8.3)

## 2014-08-03 LAB — CORTISOL: Cortisol, Plasma: 10.4 ug/dL

## 2014-08-03 MED ORDER — HEPARIN SOD (PORK) LOCK FLUSH 100 UNIT/ML IV SOLN
500.0000 [IU] | INTRAVENOUS | Status: AC | PRN
Start: 2014-08-03 — End: 2014-08-03
  Administered 2014-08-03: 500 [IU]
  Filled 2014-08-03: qty 5

## 2014-08-03 MED ORDER — SODIUM CHLORIDE 0.9 % IJ SOLN
10.0000 mL | INTRAMUSCULAR | Status: AC | PRN
  Administered 2014-08-03: 10 mL
  Filled 2014-08-03: qty 10

## 2014-08-03 MED ORDER — SODIUM CHLORIDE 0.9 % IV SOLN
INTRAVENOUS | Status: AC
  Administered 2014-08-03: 11:00:00 via INTRAVENOUS

## 2014-08-03 NOTE — Progress Notes (Signed)
Follow up completed with patient in infusion. She is receiving IVF secondary to low blood pressure. States she is eating and drinking. Weight declined and documented as 89 pounds. Patient denies other side effects. Educated patient on increasing calories in liquid forms.   Made strawberry milkshake for patient. Recommend she try health shakes.  **Disclaimer: This note was dictated with voice recognition software. Similar sounding words can inadvertently be transcribed and this note may contain transcription errors which may not have been corrected upon publication of note.**

## 2014-08-03 NOTE — Progress Notes (Signed)
Suggested Bedie speak with her pharmacist about carafate tablets instead of slurry. She may be able to dissolve the pill in water and drink it as a slurry instead of the premixed slurry.

## 2014-08-03 NOTE — Patient Instructions (Signed)
Dehydration Dehydration means your body does not have as much fluid as it needs. Your kidneys, brain, and heart will not work properly without the right amount of fluids and salt. Older adults are more likely to become dehydrated than younger adults. This is because:   Their bodies do not hold water as well.  Their bodies do not respond to temperature changes as well.  They do not get thirsty as easily or as quickly. HOME CARE  Ask your doctor how to replace body fluid losses (rehydrate).  Drink enough fluids to keep your pee (urine) clear or pale yellow.  Drink small amounts of fluids often if you feel sick to your stomach (nauseous) or throw up (vomit).  Eat like you normally do.  Avoid:  Foods or drinks high in sugar.  Bubbly (carbonated) drinks.  Juice.  Very hot or cold fluids.  Drinks with caffeine.  Fatty, greasy foods.  Alcohol.  Tobacco.  Eating too much.  Gelatin desserts.  Wash your hands to avoid spreading germs (bacteria, viruses).  Only take medicine as told by your doctor.  Keep all doctor visits as told. GET HELP IF:  You have belly (abdominal) pain that gets worse or stays in one spot (localizes).  You have a rash, stiff neck, or bad headache.  You get easily annoyed, sleepy, or are hard to wake up.  You feel weak, dizzy, or very thirsty.  You have a fever. GET HELP RIGHT AWAY IF:   You cannot drink fluid without throwing up.  You get worse even with treatment.  You throw up often.  You have watery poop (diarrhea) often.  Your vomit has blood in it or looks greenish.  Your poop (stool) has blood in it or looks black and tarry.  You have not peed in 6 to 8 hours or have only peed a small amount of very dark pee.  You pass out (faint). MAKE SURE YOU:   Understand these instructions.  Will watch your condition.  Will get help right away if you are not doing well or get worse. Document Released: 06/26/2011 Document Revised:  07/12/2013 Document Reviewed: 03/14/2013 Exit[employer] Patient Information 2015 [employer]. This information is not intended to replace advice given to you by your health care provider. Make sure you discuss any questions you have with your health care provider.  

## 2014-08-03 NOTE — Assessment & Plan Note (Signed)
Patient completed FOLFOX chemotherapy regimen on 06/19/2014.  She continues on observation only.  Also, patient has plans for a repeat esophageal irritation procedure on 08/08/2014.   She has a appointment for labs and a follow-up visit on 08/15/2014.

## 2014-08-03 NOTE — Assessment & Plan Note (Signed)
Patient continues to complain of some dizziness with sudden position changes.  Patient was positive for orthostatic hypotension with lowest blood pressure 77/58 when standing this past Tuesday 08/01/14. Patient continues with orthostatic hypotension again today; with lowest BP 76/53 when standing.  Patient denies any nausea, vomiting, diarrhea, or constipation.  She denies any UTI symptoms.  She denies any recent fevers or chills.  All labs today were within normal range.  No evidence of a UTI with urinalysis.  Urine culture pending.  Have drawn a cortisol level; which is still pending results.  Have advised patient to try compression stockings.  Differential diagnosis regarding chronic hypotension should include autonomic neuropathy.   Patient does appear mildly dehydrated today.  Will give patient 1 L normal saline IV fluid rehydration today to see if this helps.  Patient has a blood pressure cuff at home now; and advised patient we would call and check in again with her in the morning.  She was advised to call/return to go directly to the emergency department overnight if she develops any worsening symptoms whatsoever.

## 2014-08-03 NOTE — Progress Notes (Signed)
will   SYMPTOM MANAGEMENT CLINIC   HPI: Lynn Morris 74 y.o. female diagnosed with gastroesophageal cancer.  Patient is status post FOLFOX chemotherapy completed on 06/19/2014.  Patient returns for follow-up today of recent complaints of dizziness, fall, hypotension, and mild dehydration.  During her visit this past Tuesday, 08/01/2014 she was found to be fairly orthostatic with a standing blood pressure of 77/58.  Patient was complaining of dizziness with any sudden position change.  Patient was given 1 L normal saline IV fluid rehydration on that same day.  Confirmed the patient does not take any blood pressure medications whatsoever.  Patient denies any infection symptoms whatsoever.  She denies any recent fevers or chills.  She denies any nausea, vomiting, diarrhea, or constipation.  She denies any UTI symptoms.  Patient is scheduled for a repeat esophageal dilatation on Tuesday, 08/08/2014.  HPI  CURRENT THERAPY: Upcoming Treatment Dates - COLORECTAL FOLFOX q14d Days with orders from any treatment category:  07/04/2014      SCHEDULING COMMUNICATION      fosaprepitant (EMEND) 150 mg in sodium chloride 0.9 % 145 mL IVPB      palonosetron (ALOXI) injection 0.25 mg      dexamethasone (DECADRON) injection 10 mg      oxaliplatin (ELOXATIN) 125 mg in dextrose 5 % 500 mL chemo infusion      leucovorin 584 mg in dextrose 5 % 250 mL infusion      fluorouracil (ADRUCIL) chemo injection 600 mg      fluorouracil (ADRUCIL) 3,500 mg in sodium chloride 0.9 % 150 mL chemo infusion      sodium chloride 0.9 % injection 10 mL      heparin lock flush 100 unit/mL      heparin lock flush 100 unit/mL      alteplase (CATHFLO ACTIVASE) injection 2 mg      sodium chloride 0.9 % injection 3 mL      Cold Pack 1 packet      Hot Pack 1 packet      dextrose 5 % solution      TREATMENT CONDITIONS 07/06/2014      pegfilgrastim (NEULASTA) injection 6 mg      SCHEDULING COMMUNICATION      sodium chloride 0.9  % injection 10 mL      heparin lock flush 100 unit/mL      heparin lock flush 100 unit/mL      alteplase (CATHFLO ACTIVASE) injection 2 mg      sodium chloride 0.9 % injection 3 mL      Cold Pack 1 packet 07/19/2014      SCHEDULING COMMUNICATION      fosaprepitant (EMEND) 150 mg in sodium chloride 0.9 % 145 mL IVPB      palonosetron (ALOXI) injection 0.25 mg      dexamethasone (DECADRON) injection 10 mg      oxaliplatin (ELOXATIN) 125 mg in dextrose 5 % 500 mL chemo infusion      leucovorin 584 mg in dextrose 5 % 250 mL infusion      fluorouracil (ADRUCIL) chemo injection 600 mg      fluorouracil (ADRUCIL) 3,500 mg in sodium chloride 0.9 % 150 mL chemo infusion      sodium chloride 0.9 % injection 10 mL      heparin lock flush 100 unit/mL      heparin lock flush 100 unit/mL      alteplase (CATHFLO ACTIVASE) injection 2 mg      sodium  chloride 0.9 % injection 3 mL      Cold Pack 1 packet      Hot Pack 1 packet      dextrose 5 % solution      TREATMENT CONDITIONS    ROS  Past Medical History  Diagnosis Date  . Esophageal stricture   . Hyperlipemia   . Arthritis     LG TOE  . Cataract     left eye and immature  . Complication of anesthesia   . PONV (postoperative nausea and vomiting)   . Hiatal hernia 12-13-13    intermittent nausea with vomiting  . History of blood transfusion     no abnormal reaction noted, s/p stomach surgery 9'15([facility])  . Pneumonia     touch of it many yrs ago(>50yr)  . History of bronchitis > 89yrago  . History of migraine     many yrs ago  . Esophageal reflux     was on Prilosec;has been off a yr  . Diverticulosis   . Anxiety     takes Zoloft every other day  . Optic neuritis     many yrs ago  . Cancer 11/24/13    gastroesophageal carcinoma  . Low blood pressure     thought to be related to dehydration LOV 08-01-14([facility]) given IV fluids  . Hypokalemia     tx. with potassium supplement    Past Surgical History  Procedure Laterality Date   . Abdominal hysterectomy  1978    Partial   . Cesarean section  1964 & 1966  . Nose surgery  1970  . Appendectomy    . Abdominal hysterectomy    . Eus N/A 12/22/2013    Procedure: UPPER ENDOSCOPIC ULTRASOUND (EUS) LINEAR;  Surgeon: DaMilus BanisterMD;  [facility];  Service: Endoscopy;  Laterality: N/A;  . Portacath placement N/A 12/27/2013    Procedure: INSERTION PORT-A-CATH;  Surgeon: FaStark KleinMD;  [facility] Service: General;  Laterality: N/A;  . Esophagogastroduodenoscopy      with dilitation  . Colonoscopy    . Laparoscopy N/A 04/04/2014    Procedure: DIAGNOSTIC LAPAROSCOPY;  Surgeon: FaStark KleinMD;  [facility] Service: General;  Laterality: N/A;  . Partial gastrectomy N/A 04/04/2014    Procedure: TOTAL GASTRECTOMY;  Surgeon: FaStark KleinMD;  [facility] Service: General;  Laterality: N/A;  . Jejunostomy N/A 04/04/2014    Procedure: ESOPHAGOJEJUNOSTOMY AND FEEDING JEJUNOSTOMY;  Surgeon: FaStark KleinMD;  [facility] Service: General;  Laterality: N/A;/ then removal.  . Esophagogastroduodenoscopy (egd) with propofol N/A 06/26/2014    Procedure: ESOPHAGOGASTRODUODENOSCOPY (EGD) WITH PROPOFOL;  Surgeon: JoIrene ShipperMD;  [facility];  Service: Endoscopy;  Laterality: N/A;  . Balloon dilation N/A 06/26/2014    Procedure: BALLOON DILATION;  Surgeon: JoIrene ShipperMD;  [facility];  Service: Endoscopy;  Laterality: N/A;    has Gastric cancer; Anxiety and depression; Allergic rhinitis; Acid reflux; HLD (hyperlipidemia); Osteopenia; Avitaminosis D; Dermatitis seborrheica; Chronic recurrent major depressive disorder; Nausea with vomiting; Dehydration; Esophageal stricture; Acute esophagitis; Upper GI bleeding; Orthostatic hypotension; and Fall on her problem list.     is allergic to codeine.    Medication List       This list is accurate as of: 08/03/14  1:24 PM.  Always use your most recent med list.                aluminum & magnesium  hydroxide-simethicone [id number] MG/5ML suspension  Commonly known as:  MYLANTA  Take 10 mLs by mouth 4 (four) times daily.     famotidine 20 MG tablet  Commonly known as:  PEPCID  Take 1 tablet (20 mg total) by mouth 2 (two) times daily.     KLOR-CON M20 20 MEQ tablet  Generic drug:  potassium chloride SA  TAKE 1 TABLET BY MOUTH EVERY DAY     lidocaine-prilocaine cream  Commonly known as:  EMLA  Apply to port a cath site one hour prior to use. Cover with plastic. Do not rub in.     megestrol 40 MG/ML suspension  Commonly known as:  MEGACE  Take 5 mLs (200 mg total) by mouth 2 (two) times daily.     metoCLOPramide 10 MG tablet  Commonly known as:  REGLAN  Take 1 tablet (10 mg total) by mouth 3 (three) times daily before meals.     mirtazapine 30 MG tablet  Commonly known as:  REMERON  Take 1 tablet (30 mg total) by mouth at bedtime.     ondansetron 8 MG tablet  Commonly known as:  ZOFRAN  Take 1 tablet (8 mg total) by mouth every 8 (eight) hours as needed for nausea or vomiting.     sertraline 100 MG tablet  Commonly known as:  ZOLOFT  Take 100 mg by mouth every other day.     sucralfate 1 GM/10ML suspension  Commonly known as:  CARAFATE  Take 10 mLs (1 g total) by mouth 4 (four) times daily.     Vitamin D3 2000 UNITS Tabs  Take 1 tablet by mouth.         PHYSICAL EXAMINATION  Blood pressure 76/53, pulse 82, temperature 97.8 F (36.6 C), temperature source Oral, resp. rate 18, height 5' 0.5" (1.537 m), weight 89 lb 4.8 oz (40.506 kg), SpO2 99 %.   Orthostatic BP: Lying 98.72, Sitting 105/70, Standing 76/53.   Physical Exam  Constitutional: She is oriented to person, place, and time. She appears malnourished and dehydrated. She appears unhealthy. She appears cachectic.  HENT:  Head: Normocephalic and atraumatic.  Mouth/Throat: Oropharynx is clear and moist.  Eyes: Conjunctivae and EOM are normal. Pupils are equal, round, and reactive to  light. Right eye exhibits no discharge. Left eye exhibits no discharge. No scleral icterus.  Neck: Normal range of motion. Neck supple. No JVD present. No tracheal deviation present. No thyromegaly present.  Cardiovascular: Normal rate, regular rhythm, normal heart sounds and intact distal pulses.   Pulmonary/Chest: Effort normal and breath sounds normal. No respiratory distress. She has no wheezes. She has no rales. She exhibits no tenderness.  Abdominal: Soft. Bowel sounds are normal. She exhibits no distension and no mass. There is no tenderness. There is no rebound and no guarding.  Musculoskeletal: Normal range of motion. She exhibits no edema or tenderness.  Lymphadenopathy:    She has no cervical adenopathy.  Neurological: She is alert and oriented to person, place, and time. Gait normal.  Skin: Skin is warm and dry. No rash noted. No erythema.  Psychiatric: Affect normal.  Nursing note and vitals reviewed.   LABORATORY DATA:. Appointment on 08/03/2014  Component Date Value Ref Range Status  . WBC 08/03/2014 4.0  3.9 - 10.3 10e3/uL Final  . NEUT# 08/03/2014 1.9  1.5 - 6.5 10e3/uL Final  . HGB 08/03/2014 12.1  11.6 - 15.9 g/dL Final  . HCT 08/03/2014 37.9  34.8 - 46.6 % Final  . Platelets  08/03/2014 196  145 - 400 10e3/uL Final  . MCV 08/03/2014 88.5  79.5 - 101.0 fL Final  . MCH 08/03/2014 28.2  25.1 - 34.0 pg Final  . MCHC 08/03/2014 31.9  31.5 - 36.0 g/dL Final  . RBC 08/03/2014 4.28  3.70 - 5.45 10e6/uL Final  . RDW 08/03/2014 17.8* 11.2 - 14.5 % Final  . lymph# 08/03/2014 1.6  0.9 - 3.3 10e3/uL Final  . MONO# 08/03/2014 0.4  0.1 - 0.9 10e3/uL Final  . Eosinophils Absolute 08/03/2014 0.1  0.0 - 0.5 10e3/uL Final  . Basophils Absolute 08/03/2014 0.0  0.0 - 0.1 10e3/uL Final  . NEUT% 08/03/2014 47.0  38.4 - 76.8 % Final  . LYMPH% 08/03/2014 41.2  14.0 - 49.7 % Final  . MONO% 08/03/2014 9.4  0.0 - 14.0 % Final  . EOS% 08/03/2014 2.0  0.0 - 7.0 % Final  . BASO% 08/03/2014 0.4   0.0 - 2.0 % Final  . Sodium 08/03/2014 138  136 - 145 mEq/L Final  . Potassium 08/03/2014 4.2  3.5 - 5.1 mEq/L Final  . Chloride 08/03/2014 105  98 - 109 mEq/L Final  . CO2 08/03/2014 29  22 - 29 mEq/L Final  . Glucose 08/03/2014 90  70 - 140 mg/dl Final  . BUN 08/03/2014 12.4  7.0 - 26.0 mg/dL Final  . Creatinine 08/03/2014 0.7  0.6 - 1.1 mg/dL Final  . Total Bilirubin 08/03/2014 0.50  0.20 - 1.20 mg/dL Final  . Alkaline Phosphatase 08/03/2014 40  40 - 150 U/L Final  . AST 08/03/2014 15  5 - 34 U/L Final  . ALT 08/03/2014 7  0 - 55 U/L Final  . Total Protein 08/03/2014 5.8* 6.4 - 8.3 g/dL Final  . Albumin 08/03/2014 3.1* 3.5 - 5.0 g/dL Final  . Calcium 08/03/2014 8.8  8.4 - 10.4 mg/dL Final  . Anion Gap 08/03/2014 4  3 - 11 mEq/L Final  . EGFR 08/03/2014 81* >90 ml/min/1.73 m2 Final   eGFR is calculated using the CKD-EPI Creatinine Equation (2009)  . Glucose 08/03/2014 Negative  Negative mg/dL Final  . Bilirubin (Urine) 08/03/2014 Negative  Negative Final  . Ketones 08/03/2014 Negative  Negative mg/dL Final  . Specific Gravity, Urine 08/03/2014 1.015  1.003 - 1.035 Final  . Blood 08/03/2014 Negative  Negative Final  . pH 08/03/2014 6.5  4.6 - 8.0 Final  . Protein 08/03/2014 Negative  Negative- <30 mg/dL Final  . Urobilinogen, UR 08/03/2014 0.2  0.2 - 1 mg/dL Final  . Nitrite 08/03/2014 Negative  Negative Final  . Leukocyte Esterase 08/03/2014 Trace  Negative Final  . RBC / HPF 08/03/2014 Negative  0 - 2 Final  . WBC, UA 08/03/2014 0-2  0 - 2 Final  . Bacteria, UA 08/03/2014 Few  Negative- Trace Final  . Epithelial Cells 08/03/2014 Occasional  Negative- Few Final  . Mucus, UA 08/03/2014 Moderate  Negative- Small Final     RADIOGRAPHIC STUDIES: No results found.  ASSESSMENT/PLAN:    Dehydration Patient continues to complain of some dizziness with sudden position changes.  Patient was positive for orthostatic hypotension with lowest blood pressure 77/58 when standing this past  Tuesday 08/01/14. Patient continues with orthostatic hypotension again today; with lowest BP 76/53 when standing.  Patient denies any nausea, vomiting, diarrhea, or constipation.  She denies any UTI symptoms.  She denies any recent fevers or chills.  All labs today were within normal range.  No evidence of a UTI with urinalysis.  Urine culture  pending.  Have drawn a cortisol level; which is still pending results.  Have advised patient to try compression stockings.  Differential diagnosis regarding chronic hypotension should include autonomic neuropathy.   Patient does appear mildly dehydrated today.  Will give patient 1 L normal saline IV fluid rehydration today to see if this helps.  Patient has a blood pressure cuff at home now; and advised patient we would call and check in again with her in the morning.  She was advised to call/return to go directly to the emergency department overnight if she develops any worsening symptoms whatsoever.     Gastric cancer Patient completed FOLFOX chemotherapy regimen on 06/19/2014.  She continues on observation only.  Also, patient has plans for a repeat esophageal irritation procedure on 08/08/2014.   She has a appointment for labs and a follow-up visit on 08/15/2014.     Orthostatic hypotension Patient continues to complain of some dizziness with sudden position changes.  Patient was positive for orthostatic hypotension with lowest blood pressure 77/58 when standing this past Tuesday 08/01/14. Patient continues with orthostatic hypotension again today; with lowest BP 76/53 when standing.  Patient denies any nausea, vomiting, diarrhea, or constipation.  She denies any UTI symptoms.  She denies any recent fevers or chills.  All labs today were within normal range.  No evidence of a UTI with urinalysis.  Urine culture pending.  Have drawn a cortisol level; which is still pending results.  Have advised patient to try compression stockings. Pt does not take any BP  meds.  Differential diagnosis regarding chronic hypotension should include autonomic neuropathy.   Patient does appear mildly dehydrated today.  Will give patient 1 L normal saline IV fluid rehydration today to see if this helps.  Patient has a blood pressure cuff at home now; and advised patient we would call and check in again with her in the morning.  She was advised to call/return to go directly to the emergency department overnight if she develops any worsening symptoms whatsoever.      Patient stated understanding of all instructions; and was in agreement with this plan of care. The patient knows to call the clinic with any problems, questions or concerns.   Review/collaboration with Dr. Benay Spice regarding all aspects of patient's visit today.   Total time spent with patient was 25 minutes;  with greater than 75 percent of that time spent in face to face counseling regarding her symptoms and coordination of care and follow up.  Disclaimer: This note was dictated with voice recognition software. Similar sounding words can inadvertently be transcribed and may not be corrected upon review.   Drue Second, NP 08/03/2014

## 2014-08-03 NOTE — Assessment & Plan Note (Signed)
Patient continues to complain of some dizziness with sudden position changes.  Patient was positive for orthostatic hypotension with lowest blood pressure 77/58 when standing this past Tuesday 08/01/14. Patient continues with orthostatic hypotension again today; with lowest BP 76/53 when standing.  Patient denies any nausea, vomiting, diarrhea, or constipation.  She denies any UTI symptoms.  She denies any recent fevers or chills.  All labs today were within normal range.  No evidence of a UTI with urinalysis.  Urine culture pending.  Have drawn a cortisol level; which is still pending results.  Have advised patient to try compression stockings. Pt does not take any BP meds.  Differential diagnosis regarding chronic hypotension should include autonomic neuropathy.   Patient does appear mildly dehydrated today.  Will give patient 1 L normal saline IV fluid rehydration today to see if this helps.  Patient has a blood pressure cuff at home now; and advised patient we would call and check in again with her in the morning.  She was advised to call/return to go directly to the emergency department overnight if she develops any worsening symptoms whatsoever.

## 2014-08-03 NOTE — Progress Notes (Signed)
Post fluid VS reported to Selena Lesser NP.  Patient is ok for discharge.

## 2014-08-04 ENCOUNTER — Other Ambulatory Visit: Payer: Self-pay | Admitting: *Deleted

## 2014-08-04 ENCOUNTER — Encounter: Payer: Self-pay | Admitting: *Deleted

## 2014-08-04 ENCOUNTER — Telehealth: Payer: Self-pay | Admitting: *Deleted

## 2014-08-04 DIAGNOSIS — E860 Dehydration: Secondary | ICD-10-CM

## 2014-08-04 DIAGNOSIS — C169 Malignant neoplasm of stomach, unspecified: Secondary | ICD-10-CM

## 2014-08-04 LAB — URINE CULTURE

## 2014-08-04 MED ORDER — SODIUM CHLORIDE 0.9 % IV SOLN: Freq: Once | INTRAVENOUS | Status: DC

## 2014-08-04 NOTE — Progress Notes (Signed)
Patient is to receive IVF on Monday, 08/07/14, at [facility]. Patient is aware of date, time and directions. Patient verbalized understanding.

## 2014-08-04 NOTE — Telephone Encounter (Signed)
Received call from patient stating,"My blood pressures for today are: lying down 103/62, sitting 112/74 and standing is 81/60. I don't feel dizzy or lightheaded. I have an Endoscopy at [facility] on Tuesday, 08/08/14 at 08:30 am. Does Cyndee want to give me fluids on Monday so, I will be pumped up for Tuesday's procedure? If so, I would like to come in early Monday morning. I will be gone most of the day so, it's OK to leave a message on the home answering machine if you need to. My number is [phone].

## 2014-08-07 ENCOUNTER — Ambulatory Visit (HOSPITAL_COMMUNITY)
Admission: RE | Admit: 2014-08-07 | Discharge: 2014-08-07 | Disposition: A | Discharge status: Home / Self Care | Payer: Medicare Other | Source: Ambulatory Visit | Attending: Oncology | Admitting: Oncology

## 2014-08-07 VITALS — BP 91/62 | HR 67 | Temp 97.6°F | Resp 18

## 2014-08-07 DIAGNOSIS — E860 Dehydration: Secondary | ICD-10-CM | POA: Diagnosis not present

## 2014-08-07 DIAGNOSIS — C169 Malignant neoplasm of stomach, unspecified: Secondary | ICD-10-CM

## 2014-08-07 MED ORDER — HEPARIN SOD (PORK) LOCK FLUSH 100 UNIT/ML IV SOLN
500.0000 [IU] | INTRAVENOUS | Status: AC | PRN
  Administered 2014-08-07: 500 [IU]
  Filled 2014-08-07: qty 5

## 2014-08-07 MED ORDER — SODIUM CHLORIDE 0.9 % IV SOLN
Freq: Once | INTRAVENOUS | Status: AC
  Administered 2014-08-07: 09:00:00 via INTRAVENOUS

## 2014-08-07 MED ORDER — SODIUM CHLORIDE 0.9 % IJ SOLN: 10.0000 mL | INTRAMUSCULAR | Status: DC | PRN

## 2014-08-07 NOTE — Procedures (Signed)
[facility]  Procedure Note  Lynn Morris OIT:[id number] DOB: 1940/09/23 DOA: 08/07/2014   PCP: Leamon Arnt, MD   Associated Diagnosis: Gastric cancer (151.9) Dehydration (276.51)  Procedure Note: Infusion of 1 liter IV fluids   Condition During Procedure:  Pt tolerated well   Condition at Discharge:  Pt alert, oriented, ambulatory; no complications noted   Nigel Sloop, [facility]

## 2014-08-08 ENCOUNTER — Encounter (HOSPITAL_COMMUNITY)
Admission: RE | Disposition: A | Discharge status: Home / Self Care | Payer: Self-pay | Source: Ambulatory Visit | Attending: Internal Medicine

## 2014-08-08 ENCOUNTER — Ambulatory Visit (HOSPITAL_COMMUNITY)
Admission: RE | Admit: 2014-08-08 | Discharge: 2014-08-08 | Disposition: A | Discharge status: Home / Self Care | Payer: Medicare Other | Source: Ambulatory Visit | Attending: Internal Medicine | Admitting: Internal Medicine

## 2014-08-08 ENCOUNTER — Encounter (HOSPITAL_COMMUNITY): Payer: Self-pay | Admitting: Anesthesiology

## 2014-08-08 ENCOUNTER — Ambulatory Visit (HOSPITAL_COMMUNITY): Payer: Medicare Other | Admitting: Anesthesiology

## 2014-08-08 ENCOUNTER — Other Ambulatory Visit: Payer: Self-pay

## 2014-08-08 DIAGNOSIS — F419 Anxiety disorder, unspecified: Secondary | ICD-10-CM | POA: Diagnosis not present

## 2014-08-08 DIAGNOSIS — K222 Esophageal obstruction: Secondary | ICD-10-CM | POA: Diagnosis not present

## 2014-08-08 DIAGNOSIS — R1310 Dysphagia, unspecified: Secondary | ICD-10-CM | POA: Insufficient documentation

## 2014-08-08 DIAGNOSIS — R1314 Dysphagia, pharyngoesophageal phase: Secondary | ICD-10-CM

## 2014-08-08 DIAGNOSIS — Z934 Other artificial openings of gastrointestinal tract status: Secondary | ICD-10-CM | POA: Diagnosis not present

## 2014-08-08 DIAGNOSIS — Y832 Surgical operation with anastomosis, bypass or graft as the cause of abnormal reaction of the patient, or of later complication, without mention of misadventure at the time of the procedure: Secondary | ICD-10-CM | POA: Insufficient documentation

## 2014-08-08 DIAGNOSIS — K219 Gastro-esophageal reflux disease without esophagitis: Secondary | ICD-10-CM | POA: Diagnosis not present

## 2014-08-08 DIAGNOSIS — K9189 Other postprocedural complications and disorders of digestive system: Secondary | ICD-10-CM | POA: Insufficient documentation

## 2014-08-08 DIAGNOSIS — E785 Hyperlipidemia, unspecified: Secondary | ICD-10-CM | POA: Diagnosis not present

## 2014-08-08 DIAGNOSIS — Z85028 Personal history of other malignant neoplasm of stomach: Secondary | ICD-10-CM | POA: Diagnosis not present

## 2014-08-08 DIAGNOSIS — K208 Other esophagitis: Secondary | ICD-10-CM | POA: Diagnosis not present

## 2014-08-08 DIAGNOSIS — F329 Major depressive disorder, single episode, unspecified: Secondary | ICD-10-CM | POA: Insufficient documentation

## 2014-08-08 DIAGNOSIS — Z885 Allergy status to narcotic agent status: Secondary | ICD-10-CM | POA: Insufficient documentation

## 2014-08-08 DIAGNOSIS — Z903 Acquired absence of stomach [part of]: Secondary | ICD-10-CM | POA: Diagnosis not present

## 2014-08-08 HISTORY — DX: Hypotension, unspecified: I95.9

## 2014-08-08 HISTORY — DX: Hypokalemia: E87.6

## 2014-08-08 HISTORY — PX: BALLOON DILATION: SHX5330

## 2014-08-08 HISTORY — PX: ESOPHAGOGASTRODUODENOSCOPY: SHX5428

## 2014-08-08 SURGERY — EGD (ESOPHAGOGASTRODUODENOSCOPY)
Anesthesia: Monitor Anesthesia Care

## 2014-08-08 MED ORDER — SODIUM CHLORIDE 0.9 % IV SOLN: INTRAVENOUS | Status: DC

## 2014-08-08 MED ORDER — PROPOFOL 10 MG/ML IV BOLUS
INTRAVENOUS | Status: AC
  Filled 2014-08-08: qty 20

## 2014-08-08 MED ORDER — LACTATED RINGERS IV SOLN
INTRAVENOUS | Status: DC | PRN
  Administered 2014-08-08: 10:00:00 via INTRAVENOUS

## 2014-08-08 MED ORDER — PROPOFOL INFUSION 10 MG/ML OPTIME
INTRAVENOUS | Status: DC | PRN
  Administered 2014-08-08: 100 ug/kg/min via INTRAVENOUS

## 2014-08-08 MED ORDER — MIDAZOLAM HCL 2 MG/2ML IJ SOLN
INTRAMUSCULAR | Status: AC
  Filled 2014-08-08: qty 2

## 2014-08-08 MED ORDER — BUTAMBEN-TETRACAINE-BENZOCAINE 2-2-14 % EX AERO
INHALATION_SPRAY | CUTANEOUS | Status: DC | PRN
  Administered 2014-08-08: 1 via TOPICAL

## 2014-08-08 MED ORDER — KETAMINE HCL 10 MG/ML IJ SOLN
INTRAMUSCULAR | Status: DC | PRN
  Administered 2014-08-08: 10 mg via INTRAVENOUS

## 2014-08-08 MED ORDER — MIDAZOLAM HCL 5 MG/5ML IJ SOLN
INTRAMUSCULAR | Status: DC | PRN
  Administered 2014-08-08: 1 mg via INTRAVENOUS

## 2014-08-08 MED ORDER — LIDOCAINE HCL (CARDIAC) 20 MG/ML IV SOLN
INTRAVENOUS | Status: AC
  Filled 2014-08-08: qty 5

## 2014-08-08 NOTE — Transfer of Care (Signed)
Immediate Anesthesia Transfer of Care Note  Patient: Lynn Morris  Procedure(s) Performed: Procedure(s): ESOPHAGOGASTRODUODENOSCOPY (EGD) (N/A) BALLOON DILATION (N/A)  Patient Location: PACU and Endoscopy Unit  Anesthesia Type:MAC  Level of Consciousness: awake, alert , oriented and patient cooperative  Airway & Oxygen Therapy: Patient Spontanous Breathing and Patient connected to nasal cannula oxygen  Post-op Assessment: Report given to PACU RN and Post -op Vital signs reviewed and stable  Post vital signs: Reviewed and stable  Complications: No apparent anesthesia complications

## 2014-08-08 NOTE — H&P (View-Only) (Signed)
HISTORY OF PRESENT ILLNESS:  Lynn Morris is a 74 y.o. female with a history of gastric cancer diagnosed in May 2015 status post total gastrectomy with ileal pouch and ileum esophageal anastomosis. Had been receiving chemotherapy. Was evaluated for severe dysphagia postoperatively and underwent upper endoscopy 06/26/2014. She was found to have severe ulcerative esophagitis. A 10 mm anastomotic picture which was dilated to 12 mm. Post dilation significant bleeding for which she was admitted to the hospital for observation and did fine. Since that time, she has been on Carafate slurries 4 times daily. She presents today for routine follow-up. The patient is accompanied by her husband. Since her dilation, she reports marked improvement in swallowing. However, still with intermittent solid food dysphagia when not chewing her food well. No vomiting. Less burning discomfort on Carafate. Complains about the cost of Carafate being $95 every 2 weeks. Has had improved strength and several pound weight gain. Her last visit with oncology she decided against further chemotherapy. No new complaints.  REVIEW OF SYSTEMS:  All non-GI ROS negative except for anxiety  Past Medical History  Diagnosis Date  . Esophageal stricture   . Hyperlipemia   . Arthritis     LG TOE  . Cataract     right eye and immature  . Complication of anesthesia   . PONV (postoperative nausea and vomiting)   . Hiatal hernia 12-13-13    intermittent nausea with vomiting  . History of blood transfusion     no abnormal reaction noted  . Pneumonia     touch of it many yrs ago(>6yrs)  . History of bronchitis > 75yrs ago  . History of migraine     many yrs ago  . Esophageal reflux     was on Prilosec;has been off a yr  . Diverticulosis   . Anxiety     takes Zoloft every other day  . Optic neuritis     many yrs ago  . Cancer 11/24/13    gastroesophageal carcinoma    Past Surgical History  Procedure Laterality Date  . Abdominal  hysterectomy  1978    Partial   . Cesarean section  1964 & 1966  . Nose surgery  1970  . Appendectomy    . Abdominal hysterectomy    . Eus N/A 12/22/2013    Procedure: UPPER ENDOSCOPIC ULTRASOUND (EUS) LINEAR;  Surgeon: Milus Banister, MD;  Location: [facility];  Service: Endoscopy;  Laterality: N/A;  . Portacath placement N/A 12/27/2013    Procedure: INSERTION PORT-A-CATH;  Surgeon: Stark Klein, MD;  Location: [place];  Service: General;  Laterality: N/A;  . Esophagogastroduodenoscopy      with dilitation  . Colonoscopy    . Laparoscopy N/A 04/04/2014    Procedure: DIAGNOSTIC LAPAROSCOPY;  Surgeon: Stark Klein, MD;  Location: [place];  Service: General;  Laterality: N/A;  . Partial gastrectomy N/A 04/04/2014    Procedure: TOTAL GASTRECTOMY;  Surgeon: Stark Klein, MD;  Location: [place];  Service: General;  Laterality: N/A;  . Jejunostomy N/A 04/04/2014    Procedure: ESOPHAGOJEJUNOSTOMY AND FEEDING JEJUNOSTOMY;  Surgeon: Stark Klein, MD;  Location: [place];  Service: General;  Laterality: N/A;  . Esophagogastroduodenoscopy (egd) with propofol N/A 06/26/2014    Procedure: ESOPHAGOGASTRODUODENOSCOPY (EGD) WITH PROPOFOL;  Surgeon: Irene Shipper, MD;  [facility];  Service: Endoscopy;  Laterality: N/A;  . Balloon dilation N/A 06/26/2014    Procedure: BALLOON DILATION;  Surgeon: Irene Shipper, MD;  [facility];  Service:  Endoscopy;  Laterality: N/A;    Social History Lynn Morris  reports that she has never smoked. She has never used smokeless tobacco. She reports that she does not drink alcohol or use illicit drugs.  family history includes Diabetes in her mother; Hypertension in her mother; Pancreatic cancer in her mother. There is no history of Colon cancer.  Allergies  Allergen Reactions  . Codeine     Unknown- patient does not remember       PHYSICAL EXAMINATION: Vital signs: BP 90/52 mmHg  Pulse 64  Ht 5' 0.5" (1.537 m)  Wt 95 lb 8 oz (43.319 kg)  BMI 18.34  kg/m2 General: Well-developed, well-nourished, no acute distress HEENT: Sclerae are anicteric, conjunctiva pink. Oral mucosa intact Lungs: Clear Heart: Regular Abdomen: soft, nontender, nondistended, no obvious ascites, no peritoneal signs, normal bowel sounds. No organomegaly. Prior surgical incision well-healed Extremities: No edema Psychiatric: alert and oriented x3. Cooperative   ASSESSMENT:  #1. Gastric cancer status post total gastrectomy #2. Anastomotic stricture as described. Status post balloon dilation with significant post procedure bleeding requiring observation. The patient's swallowing has improved, though not to her satisfaction. She is interested in relook endoscopy with esophageal dilation #3. Ulcerative esophagitis   PLAN:  #1. Continue Carafate slurries #2. Repeat EGD with balloon dilation of the anastomosis.The nature of the procedure, as well as the risks, benefits, and alternatives were carefully and thoroughly reviewed with the patient. Ample time for discussion and questions allowed. The patient understood, was satisfied, and agreed to proceed. #3. Ongoing neoplasia surveillance with oncology

## 2014-08-08 NOTE — Interval H&P Note (Signed)
History and Physical Interval Note:  08/08/2014 9:19 AM  Lynn Morris  has presented today for surgery, with the diagnosis of Dysphagia  The various methods of treatment have been discussed with the patient and family. After consideration of risks, benefits and other options for treatment, the patient has consented to  Procedure(s): ESOPHAGOGASTRODUODENOSCOPY (EGD) (N/A) BALLOON DILATION (N/A) as a surgical intervention .  The patient's history has been reviewed, patient examined, no change in status, stable for surgery.  I have reviewed the patient's chart and labs.  Questions were answered to the patient's satisfaction.     Lynn Morris

## 2014-08-08 NOTE — Op Note (Signed)
[facility] [place], [address]   ENDOSCOPY PROCEDURE REPORT  PATIENT: Lynn, Morris  MR#: [id number] BIRTHDATE: 26-Jun-1941 , 73  yrs. old GENDER: female ENDOSCOPIST: Eustace Quail, MD REFERRED BY:  .  Self / Office PROCEDURE DATE:  08/08/2014 PROCEDURE:  EGD w/ balloon dilation   - 12,13.5 mm ASA CLASS:     Class III INDICATIONS:  dysphagia and therapeutic procedure.  area status post Roux-en-Y esophagojejunostomy September 2015 (T3,N2) MEDICATIONS: Monitored anesthesia care and Per Anesthesia TOPICAL ANESTHETIC: Cetacaine Spray  DESCRIPTION OF PROCEDURE: After the risks benefits and alternatives of the procedure were thoroughly explained, informed consent was obtained.  The Pentax Gastroscope [id number] endoscope was introduced through the mouth and advanced to the second portion of the duodenum , Without limitations.  The instrument was slowly withdrawn as the mucosa was fully examined.  EXAM: The proximal two thirds of the esophagus was normal.  The distal third revealed severe erosive esophagitis with significant contact bleeding.  There was a 7 mm anastomotic stricture.  The stricture was dilated with TTS balloon at 12 then 13.5 mm.  No significant additional bleeding precipitated.  The small bowel was then intubated and appeared normal.  Retroflexion was not performed.     The scope was then withdrawn from the patient and the procedure completed.  COMPLICATIONS: There were no immediate complications.  ENDOSCOPIC IMPRESSION: 1. Severe erosive esophagitis distally 2. Anastomotic stricture status post dilation to 13.5 mm 3. Status post Roux-en-Y esophagojejunostomy  RECOMMENDATIONS: 1.  Continue current meds. I prefer you resume Carafate slurry 4 times daily 2.  Clear liquids until 2 PM, then soft foods.  Resume prior diet tomorrow. Chew food well. Be careful with bulky items 3. Office follow-up with Dr. Henrene Pastor in 4-6 weeks.  REPEAT  EXAM:  eSigned:  Eustace Quail, MD 08/08/2014 10:52 AM    DX:IPJAS Barry Dienes, MD, Clinton Gallant and Kavin Leech, MD

## 2014-08-08 NOTE — Anesthesia Preprocedure Evaluation (Addendum)
Anesthesia Evaluation  Patient identified by MRN, date of birth, ID band Patient awake    Reviewed: Allergy & Precautions, NPO status , Patient's Chart, lab work & pertinent test results  History of Anesthesia Complications (+) PONV and history of anesthetic complications  Airway Mallampati: II  TM Distance: >3 FB Neck ROM: Full    Dental  (+) Teeth Intact, Dental Advisory Given   Pulmonary neg pulmonary ROS,    Pulmonary exam normal       Cardiovascular negative cardio ROS      Neuro/Psych PSYCHIATRIC DISORDERS Anxiety Depression    GI/Hepatic Neg liver ROS, hiatal hernia, GERD-  ,  Endo/Other    Renal/GU negative Renal ROS     Musculoskeletal   Abdominal   Peds  Hematology negative hematology ROS (+)   Anesthesia Other Findings   Reproductive/Obstetrics                            Anesthesia Physical Anesthesia Plan  ASA: III  Anesthesia Plan: MAC   Post-op Pain Management:    Induction: Intravenous  Airway Management Planned: Simple Face Mask  Additional Equipment:   Intra-op Plan:   Post-operative Plan:   Informed Consent: I have reviewed the patients History and Physical, chart, labs and discussed the procedure including the risks, benefits and alternatives for the proposed anesthesia with the patient or authorized representative who has indicated his/her understanding and acceptance.   Dental advisory given  Plan Discussed with:   Anesthesia Plan Comments:        Anesthesia Quick Evaluation

## 2014-08-08 NOTE — Anesthesia Postprocedure Evaluation (Signed)
Anesthesia Post Note  Patient: Lynn Morris  Procedure(s) Performed: Procedure(s) (LRB): ESOPHAGOGASTRODUODENOSCOPY (EGD) (N/A) BALLOON DILATION (N/A)  Anesthesia type: MAC  Patient location: PACU  Post pain: Pain level controlled  Post assessment: Patient's Cardiovascular Status Stable  Last Vitals:  Filed Vitals:   08/08/14 1046  BP: 134/73  Pulse: 69  Temp: 36.4 C  Resp: 18    Post vital signs: Reviewed and stable  Level of consciousness: sedated  Complications: No apparent anesthesia complications

## 2014-08-09 ENCOUNTER — Encounter (HOSPITAL_COMMUNITY): Payer: Self-pay | Admitting: Internal Medicine

## 2014-08-09 ENCOUNTER — Telehealth: Payer: Self-pay | Admitting: Internal Medicine

## 2014-08-09 MED ORDER — SUCRALFATE 1 GM/10ML PO SUSP: 1.0000 g | Freq: Four times a day (QID) | ORAL | Status: DC

## 2014-08-09 NOTE — Telephone Encounter (Signed)
carafate suspension sent

## 2014-08-15 ENCOUNTER — Telehealth: Payer: Self-pay | Admitting: Internal Medicine

## 2014-08-15 ENCOUNTER — Ambulatory Visit: Payer: Medicare Other | Admitting: Nutrition

## 2014-08-15 ENCOUNTER — Ambulatory Visit (HOSPITAL_BASED_OUTPATIENT_CLINIC_OR_DEPARTMENT_OTHER): Payer: Medicare Other | Admitting: Nurse Practitioner

## 2014-08-15 ENCOUNTER — Other Ambulatory Visit (HOSPITAL_BASED_OUTPATIENT_CLINIC_OR_DEPARTMENT_OTHER): Payer: Medicare Other

## 2014-08-15 ENCOUNTER — Encounter: Payer: Medicare Other | Admitting: Internal Medicine

## 2014-08-15 ENCOUNTER — Ambulatory Visit (HOSPITAL_BASED_OUTPATIENT_CLINIC_OR_DEPARTMENT_OTHER): Payer: Medicare Other

## 2014-08-15 ENCOUNTER — Telehealth: Payer: Self-pay | Admitting: Nurse Practitioner

## 2014-08-15 VITALS — BP 111/73 | HR 73 | Temp 98.0°F | Resp 18 | Ht 60.5 in | Wt 90.2 lb

## 2014-08-15 DIAGNOSIS — C169 Malignant neoplasm of stomach, unspecified: Secondary | ICD-10-CM

## 2014-08-15 DIAGNOSIS — Z95828 Presence of other vascular implants and grafts: Secondary | ICD-10-CM

## 2014-08-15 LAB — BASIC METABOLIC PANEL (CC13)
Anion Gap: 9 mEq/L (ref 3–11)
BUN: 14.5 mg/dL (ref 7.0–26.0)
CO2: 24 meq/L (ref 22–29)
CREATININE: 0.7 mg/dL (ref 0.6–1.1)
Calcium: 9 mg/dL (ref 8.4–10.4)
Chloride: 108 mEq/L (ref 98–109)
EGFR: 88 mL/min/{1.73_m2} — ABNORMAL LOW (ref >90)
Glucose: 84 mg/dl (ref 70–140)
POTASSIUM: 3.9 meq/L (ref 3.5–5.1)
Sodium: 141 mEq/L (ref 136–145)

## 2014-08-15 MED ORDER — SODIUM CHLORIDE 0.9 % IJ SOLN
10.0000 mL | INTRAMUSCULAR | Status: DC | PRN
  Administered 2014-08-15: 10 mL via INTRAVENOUS
  Filled 2014-08-15: qty 10

## 2014-08-15 MED ORDER — HEPARIN SOD (PORK) LOCK FLUSH 100 UNIT/ML IV SOLN
500.0000 [IU] | Freq: Once | INTRAVENOUS | Status: AC
  Administered 2014-08-15: 500 [IU] via INTRAVENOUS
  Filled 2014-08-15: qty 5

## 2014-08-15 NOTE — Patient Instructions (Signed)

## 2014-08-15 NOTE — Progress Notes (Signed)
[place] OFFICE PROGRESS NOTE   Diagnosis:  Gastric cancer  INTERVAL HISTORY:   Ms. Mackert returns as scheduled. She underwent an upper endoscopy on 08/08/2014. Findings included severe erosive esophagitis distally and an anastomotic stricture which was dilated. She notes improvement in swallowing. She is tolerating a regular diet. No nausea or vomiting. Bowels are moving. She denies pain.  Objective:  Vital signs in last 24 hours:  Blood pressure 111/73, pulse 73, temperature 98 F (36.7 C), temperature source Oral, resp. rate 18, height 5' 0.5" (1.537 m), weight 90 lb 3.2 oz (40.914 kg), SpO2 100 %.    HEENT: No thrush or ulcers. Resp: Lungs clear bilaterally. Cardio: Regular rate and rhythm. GI: Abdomen soft and nontender. No hepatomegaly. No mass. Vascular: No leg edema. Port-A-Cath without erythema.  Lab Results:  Lab Results  Component Value Date   WBC 4.0 08/03/2014   HGB 12.1 08/03/2014   HCT 37.9 08/03/2014   MCV 88.5 08/03/2014   PLT 196 08/03/2014   NEUTROABS 1.9 08/03/2014    Imaging:  No results found.  Medications: I have reviewed the patient's current medications.  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   Cycle 1 CAPOX beginning 01/02/2014.   Cycle 2 CAPOX beginning 01/23/2014.   Cycle 3 CAPOX beginning 02/13/2014.   Restaging PET scan 02/27/2014 with mild hypermetabolism in the hilar regions with an SUV max on the right of 4.9. Previously seen mild hypermetabolism associated with the distal esophagus not readily identified. Mild uptake in the stomach without a focal lesion or uptake above blood pool.   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  Cycle 1 adjuvant FOLFOX 05/08/2014  Cycle 2 held 05/29/2014 due to neutropenia.  Cycle 2 adjuvant FOLFOX 06/05/2014 with Neulasta support.  Cycle 3 adjuvant FOLFOX 06/19/2014 with Neulasta support. 2. History of gastroesophageal reflux disease and peptic stricture, status post an esophageal dilatation procedure in August 2012 3. History of solid dysphagia secondary to #1.  4. Anxiety. 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 6. Anorexia. Trial of Remeron initiated 05/08/2014. 7. Nausea and vomiting-persistent following FOLFOX 05/08/2014. Improved since beginning Reglan. 8. Neutropenia secondary to chemotherapy, cycle 2 FOLFOX held 05/29/2014. 9. Status post upper endoscopy 06/26/2014 with findings of severe ulcerative esophagitis; 10 mm anastomotic stricture status post balloon dilatation to 12 mm. 10. Status post upper endoscopy 08/08/2014 with findings of severe ulcerative esophagitis distally; 7 mm anastomotic stricture status post dilatation to 13.5 mm.   Disposition: Ms. Vandenbos appears stable. She will continue follow-up with GI regarding the esophagitis and stricture. She will return for a Port-A-Cath flush in 6 weeks and a follow-up visit in 3 months. She will contact the office  in the interim with any  problems.  Plan reviewed with Dr. Benay Spice.    Ned Card ANP/GNP-BC   08/15/2014  9:48 AM

## 2014-08-15 NOTE — Progress Notes (Signed)
Nutrition follow-up completed with patient and husband. Patient's weight documented as 90.2 pounds January 26 increased slightly from 89.3 pounds January 14. Patient thinks she is eating slightly better. Patient denies nausea or diarrhea. Patient is taking Carafate for esophagitis.  Nutrition diagnosis: Unintended weight loss.  Slightly improved.  Intervention: Educated patient on ways to increase calories without increasing volume. Provided specific snack examples for patient. Educated patient to consume snacks or meals every 2 hours. Educated her on the importance of increased oral intake to assist with healing esophagitis. Questions answered.  Teach back method used.  Monitoring, evaluation, goals: Patient will increase calories and protein and consume snack or meal every 2 hours for weight gain.  Next visit: Will continue to work with patient by phone or with follow-up appointment as needed.  **Disclaimer: This note was dictated with voice recognition software. Similar sounding words can inadvertently be transcribed and this note may contain transcription errors which may not have been corrected upon publication of note.**

## 2014-08-15 NOTE — Telephone Encounter (Signed)
Gave avs & calendar for March/April. °

## 2014-08-16 ENCOUNTER — Other Ambulatory Visit: Payer: Self-pay

## 2014-08-16 MED ORDER — FAMOTIDINE 20 MG PO TABS: 20.0000 mg | ORAL_TABLET | Freq: Two times a day (BID) | ORAL | Status: DC

## 2014-08-16 NOTE — Telephone Encounter (Signed)
Refilled Generic Pepcid

## 2014-08-24 ENCOUNTER — Other Ambulatory Visit: Payer: Self-pay | Admitting: Physician Assistant

## 2014-09-03 ENCOUNTER — Other Ambulatory Visit: Payer: Self-pay | Admitting: Physician Assistant

## 2014-09-05 ENCOUNTER — Other Ambulatory Visit: Payer: Self-pay | Admitting: Physician Assistant

## 2014-09-11 ENCOUNTER — Ambulatory Visit (INDEPENDENT_AMBULATORY_CARE_PROVIDER_SITE_OTHER): Payer: Medicare Other | Admitting: Internal Medicine

## 2014-09-11 ENCOUNTER — Encounter: Payer: Self-pay | Admitting: Internal Medicine

## 2014-09-11 ENCOUNTER — Telehealth: Payer: Self-pay | Admitting: Oncology

## 2014-09-11 VITALS — BP 100/62 | HR 64 | Ht 60.5 in | Wt 90.5 lb

## 2014-09-11 DIAGNOSIS — K209 Esophagitis, unspecified without bleeding: Secondary | ICD-10-CM

## 2014-09-11 DIAGNOSIS — R634 Abnormal weight loss: Secondary | ICD-10-CM

## 2014-09-11 DIAGNOSIS — K222 Esophageal obstruction: Secondary | ICD-10-CM

## 2014-09-11 DIAGNOSIS — C169 Malignant neoplasm of stomach, unspecified: Secondary | ICD-10-CM

## 2014-09-11 NOTE — Patient Instructions (Signed)
Please follow up with Dr. Henrene Pastor on 12/13/2014 at 9:00am

## 2014-09-11 NOTE — Progress Notes (Signed)
HISTORY OF PRESENT ILLNESS:  Lynn Morris is a 74 y.o. female with a history of gastric cancer diagnosed in May 2015 status post total gastrectomy with ileal pouch and ileum esophageal anastomosis. Had been receiving chemotherapy. Was evaluated for severe dysphagia postoperatively and underwent upper endoscopy 06/26/2014. She was found to have severe ulcerative esophagitis. A 10 mm anastomotic picture which was dilated to 12 mm. Post dilation significant bleeding for which she was admitted to the hospital for observation and did fine. She was subsequently seen in the office for follow-up 07/26/2014. Repeat EGD with balloon dilation to a maximal diameter of 13.5 mm was performed 08/08/2014. Still with significant erosive esophagitis. She resumed Carafate slurry 4 times daily as instructed and presents today for follow-up. Overall she states she is doing very well. Eating without difficulty, though does have some early satiety. She does complain of belching despite Pepcid twice daily. She will have loose stools with dairy products such as milk shakes. No bleeding. Last CBC January 2016 revealed hemoglobin 12.1. Basic metabolic panel later that month normal  REVIEW OF SYSTEMS:  All non-GI ROS negative except for anxiety, depression, fatigue, sleeping problems  Past Medical History  Diagnosis Date  . Esophageal stricture   . Hyperlipemia   . Arthritis     LG TOE  . Cataract     left eye and immature  . Complication of anesthesia   . PONV (postoperative nausea and vomiting)   . Hiatal hernia 12-13-13    intermittent nausea with vomiting  . History of blood transfusion     no abnormal reaction noted, s/p stomach surgery 9'15([facility])  . Pneumonia     touch of it many yrs ago(>50yrs)  . History of bronchitis > 59yrs ago  . History of migraine     many yrs ago  . Esophageal reflux     was on Prilosec;has been off a yr  . Diverticulosis   . Anxiety     takes Zoloft every other day  . Optic neuritis      many yrs ago  . Cancer 11/24/13    gastroesophageal carcinoma  . Low blood pressure     thought to be related to dehydration LOV 08-01-14([facility]) given IV fluids  . Hypokalemia     tx. with potassium supplement    Past Surgical History  Procedure Laterality Date  . Abdominal hysterectomy  1978    Partial   . Cesarean section  1964 & 1966  . Nose surgery  1970  . Appendectomy    . Abdominal hysterectomy    . Eus N/A 12/22/2013    Procedure: UPPER ENDOSCOPIC ULTRASOUND (EUS) LINEAR;  Surgeon: Milus Banister, MD;  Location: [facility];  Service: Endoscopy;  Laterality: N/A;  . Portacath placement N/A 12/27/2013    Procedure: INSERTION PORT-A-CATH;  Surgeon: Stark Klein, MD;  Location: [facility];  Service: General;  Laterality: N/A;  . Esophagogastroduodenoscopy      with dilitation  . Colonoscopy    . Laparoscopy N/A 04/04/2014    Procedure: DIAGNOSTIC LAPAROSCOPY;  Surgeon: Stark Klein, MD;  Location: [facility];  Service: General;  Laterality: N/A;  . Partial gastrectomy N/A 04/04/2014    Procedure: TOTAL GASTRECTOMY;  Surgeon: Stark Klein, MD;  Location: [facility];  Service: General;  Laterality: N/A;  . Jejunostomy N/A 04/04/2014    Procedure: ESOPHAGOJEJUNOSTOMY AND FEEDING JEJUNOSTOMY;  Surgeon: Stark Klein, MD;  Location: [facility];  Service: General;  Laterality: N/A;/ then removal.  . Esophagogastroduodenoscopy (egd)  with propofol N/A 06/26/2014    Procedure: ESOPHAGOGASTRODUODENOSCOPY (EGD) WITH PROPOFOL;  Surgeon: Irene Shipper, MD;  Location: [facility];  Service: Endoscopy;  Laterality: N/A;  . Balloon dilation N/A 06/26/2014    Procedure: BALLOON DILATION;  Surgeon: Irene Shipper, MD;  Location: [facility];  Service: Endoscopy;  Laterality: N/A;  . Esophagogastroduodenoscopy N/A 08/08/2014    Procedure: ESOPHAGOGASTRODUODENOSCOPY (EGD);  Surgeon: Irene Shipper, MD;  Location: [facility];  Service: Endoscopy;  Laterality: N/A;  . Balloon dilation N/A 08/08/2014    Procedure: BALLOON  DILATION;  Surgeon: Irene Shipper, MD;  Location: [facility];  Service: Endoscopy;  Laterality: N/A;    Social History [name]  reports that she has never smoked. She has never used smokeless tobacco. She reports that she does not drink alcohol or use illicit drugs.  family history includes Diabetes in her mother; Hypertension in her mother; Pancreatic cancer in her mother. There is no history of Colon cancer.  Allergies  Allergen Reactions  . Codeine     Unknown- patient does not remember       PHYSICAL EXAMINATION: Vital signs: BP 100/62 mmHg  Pulse 64  Ht 5' 0.5" (1.537 m)  Wt 90 lb 8 oz (41.051 kg)  BMI 17.38 kg/m2 General: Thin elderly female, no acute distress HEENT: Sclerae are anicteric, conjunctiva pink. Oral mucosa intact Lungs: Clear Heart: Regular Abdomen: soft, nontender, nondistended, no obvious ascites, no peritoneal signs, normal bowel sounds. No organomegaly. Prior surgical incisions well-healed Extremities: No edema Psychiatric: alert and oriented x3. Cooperative   ASSESSMENT:  #1. Gastric cancer status post total gastrectomy with Roux-en-Y esophagojejunostomy #2. Postoperative erosive esophagitis and symptomatic anastomotic stricture requiring endoscopic dilation. Currently reporting no significant difficulties with eating and prefers observational management at this point   PLAN:  #1. Continue Carafate slurry 4 times a day #2. Keep routine oncology appointment in April #3. Routine GI follow-up in May. Plans to repeat endoscopy with esophageal dilation as needed

## 2014-09-11 NOTE — Telephone Encounter (Signed)
Pt called to r/s flush on the day of her husband's apt across the street, pt confirmed flush D/T... KJ

## 2014-09-13 ENCOUNTER — Other Ambulatory Visit: Payer: Self-pay

## 2014-09-13 ENCOUNTER — Other Ambulatory Visit: Payer: Self-pay | Admitting: Physician Assistant

## 2014-09-13 MED ORDER — SUCRALFATE 1 GM/10ML PO SUSP: 1.0000 g | Freq: Four times a day (QID) | ORAL | Status: DC

## 2014-09-21 ENCOUNTER — Ambulatory Visit (HOSPITAL_BASED_OUTPATIENT_CLINIC_OR_DEPARTMENT_OTHER): Payer: Medicare Other

## 2014-09-21 VITALS — BP 123/87 | HR 67 | Temp 97.7°F

## 2014-09-21 DIAGNOSIS — Z95828 Presence of other vascular implants and grafts: Secondary | ICD-10-CM

## 2014-09-21 DIAGNOSIS — Z452 Encounter for adjustment and management of vascular access device: Secondary | ICD-10-CM

## 2014-09-21 DIAGNOSIS — C169 Malignant neoplasm of stomach, unspecified: Secondary | ICD-10-CM

## 2014-09-21 MED ORDER — HEPARIN SOD (PORK) LOCK FLUSH 100 UNIT/ML IV SOLN
500.0000 [IU] | Freq: Once | INTRAVENOUS | Status: AC
  Administered 2014-09-21: 500 [IU] via INTRAVENOUS
  Filled 2014-09-21: qty 5

## 2014-09-21 MED ORDER — SODIUM CHLORIDE 0.9 % IJ SOLN
10.0000 mL | INTRAMUSCULAR | Status: DC | PRN
  Administered 2014-09-21: 10 mL via INTRAVENOUS
  Filled 2014-09-21: qty 10

## 2014-09-21 NOTE — Patient Instructions (Signed)

## 2014-11-08 ENCOUNTER — Other Ambulatory Visit: Payer: Self-pay | Admitting: Internal Medicine

## 2014-11-13 ENCOUNTER — Other Ambulatory Visit: Payer: Self-pay | Admitting: *Deleted

## 2014-11-13 ENCOUNTER — Telehealth: Payer: Self-pay | Admitting: *Deleted

## 2014-11-13 NOTE — Telephone Encounter (Signed)
VM message from patient. She is asking if it is ok for her to have breakfast prior to her labs tomorrow morning. Called patient back and spoke with her, informing her that it would be ok for her to have breakfast tomorrow morning. She verbalized understanding.

## 2014-11-14 ENCOUNTER — Telehealth: Payer: Self-pay | Admitting: Oncology

## 2014-11-14 ENCOUNTER — Other Ambulatory Visit (HOSPITAL_BASED_OUTPATIENT_CLINIC_OR_DEPARTMENT_OTHER): Payer: Medicare Other

## 2014-11-14 ENCOUNTER — Ambulatory Visit (HOSPITAL_BASED_OUTPATIENT_CLINIC_OR_DEPARTMENT_OTHER): Payer: Medicare Other

## 2014-11-14 ENCOUNTER — Ambulatory Visit (HOSPITAL_BASED_OUTPATIENT_CLINIC_OR_DEPARTMENT_OTHER): Payer: Medicare Other | Admitting: Oncology

## 2014-11-14 VITALS — BP 117/69 | HR 71 | Temp 97.8°F | Resp 18 | Ht 60.5 in | Wt 91.1 lb

## 2014-11-14 DIAGNOSIS — C169 Malignant neoplasm of stomach, unspecified: Secondary | ICD-10-CM

## 2014-11-14 DIAGNOSIS — Z95828 Presence of other vascular implants and grafts: Secondary | ICD-10-CM

## 2014-11-14 LAB — COMPREHENSIVE METABOLIC PANEL (CC13)
ALBUMIN: 3.4 g/dL — AB (ref 3.5–5.0)
ALT: 11 U/L (ref 0–55)
ANION GAP: 10 meq/L (ref 3–11)
AST: 17 U/L (ref 5–34)
Alkaline Phosphatase: 57 U/L (ref 40–150)
BUN: 18.5 mg/dL (ref 7.0–26.0)
CO2: 22 meq/L (ref 22–29)
CREATININE: 0.9 mg/dL (ref 0.6–1.1)
Calcium: 8.8 mg/dL (ref 8.4–10.4)
Chloride: 110 mEq/L — ABNORMAL HIGH (ref 98–109)
EGFR: 66 mL/min/{1.73_m2} — ABNORMAL LOW (ref >90)
Glucose: 123 mg/dl (ref 70–140)
Potassium: 3.5 mEq/L (ref 3.5–5.1)
Sodium: 141 mEq/L (ref 136–145)
Total Bilirubin: 0.54 mg/dL (ref 0.20–1.20)
Total Protein: 6 g/dL — ABNORMAL LOW (ref 6.4–8.3)

## 2014-11-14 LAB — CBC WITH DIFFERENTIAL/PLATELET
BASO%: 0.4 % (ref 0.0–2.0)
BASOS ABS: 0 10*3/uL (ref 0.0–0.1)
EOS ABS: 0.2 10*3/uL (ref 0.0–0.5)
EOS%: 4.2 % (ref 0.0–7.0)
HCT: 36.5 % (ref 34.8–46.6)
HGB: 12.3 g/dL (ref 11.6–15.9)
LYMPH%: 30.1 % (ref 14.0–49.7)
MCH: 29.5 pg (ref 25.1–34.0)
MCHC: 33.7 g/dL (ref 31.5–36.0)
MCV: 87.5 fL (ref 79.5–101.0)
MONO#: 0.4 10*3/uL (ref 0.1–0.9)
MONO%: 8.7 % (ref 0.0–14.0)
NEUT#: 2.5 10*3/uL (ref 1.5–6.5)
NEUT%: 56.6 % (ref 38.4–76.8)
PLATELETS: 191 10*3/uL (ref 145–400)
RBC: 4.17 10*6/uL (ref 3.70–5.45)
RDW: 13.3 % (ref 11.2–14.5)
WBC: 4.5 10*3/uL (ref 3.9–10.3)
lymph#: 1.4 10*3/uL (ref 0.9–3.3)

## 2014-11-14 MED ORDER — SODIUM CHLORIDE 0.9 % IJ SOLN
10.0000 mL | INTRAMUSCULAR | Status: DC | PRN
  Administered 2014-11-14: 10 mL via INTRAVENOUS
  Filled 2014-11-14: qty 10

## 2014-11-14 MED ORDER — HEPARIN SOD (PORK) LOCK FLUSH 100 UNIT/ML IV SOLN
500.0000 [IU] | Freq: Once | INTRAVENOUS | Status: AC
  Administered 2014-11-14: 500 [IU] via INTRAVENOUS
  Filled 2014-11-14: qty 5

## 2014-11-14 NOTE — Patient Instructions (Signed)

## 2014-11-14 NOTE — Telephone Encounter (Signed)
no answer...mailed pt appt sched/avs and letter °

## 2014-11-14 NOTE — Progress Notes (Signed)
Lynn Morris   Diagnosis: Gastroesophageal carcinoma  INTERVAL HISTORY:   Lynn Morris returns as scheduled. She reports a good appetite. She has early satiety. She is active gardening. She reports firmness and discomfort in the right lower abdomen after eating. This resolves spontaneously. No dysphagia.  Objective:  Vital signs in last 24 hours:  Blood pressure 117/69, pulse 71, temperature 97.8 F (36.6 C), temperature source Oral, resp. rate 18, height 5' 0.5" (1.537 m), weight 91 lb 1.6 oz (41.323 kg), SpO2 100 %.    HEENT: Neck without mass Lymphatics: No cervical, supraclavicular, or axillary nodes Resp: Lungs clear bilaterally Cardio: Regular rate and rhythm GI: No hepatosplenomegaly, nontender, no mass Vascular: No leg edema   Portacath/PICC-without erythema  Lab Results:  Lab Results  Component Value Date   WBC 4.5 11/14/2014   HGB 12.3 11/14/2014   HCT 36.5 11/14/2014   MCV 87.5 11/14/2014   PLT 191 11/14/2014   NEUTROABS 2.5 11/14/2014     Medications: I have reviewed the patient's current medications.  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   Cycle 1 CAPOX beginning 01/02/2014.   Cycle 2 CAPOX beginning 01/23/2014.   Cycle 3 CAPOX beginning 02/13/2014.   Restaging PET scan 02/27/2014 with mild hypermetabolism in the hilar regions with an SUV max on the right of 4.9. Previously seen mild hypermetabolism associated with the distal esophagus not readily identified. Mild uptake in the stomach without a focal lesion or uptake above blood pool.   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  Cycle 1 adjuvant FOLFOX 05/08/2014  Cycle 2 held 05/29/2014 due to neutropenia.  Cycle 2 adjuvant FOLFOX 06/05/2014 with Neulasta support.  Cycle 3 adjuvant FOLFOX 06/19/2014 with Neulasta support. 2. History of gastroesophageal reflux disease and peptic stricture, status post an esophageal dilatation procedure in August 2012 3. History of solid dysphagia secondary to #1.  4. Anxiety. 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 6. Anorexia. Trial of Remeron initiated 05/08/2014. 7. Nausea and vomiting-persistent following FOLFOX 05/08/2014. Improved since beginning Reglan. 8. Neutropenia secondary to chemotherapy, cycle 2 FOLFOX held 05/29/2014. 9. Status post upper endoscopy 06/26/2014 with findings of severe ulcerative esophagitis; 10 mm anastomotic stricture status post balloon dilatation to 12 mm. 10. Status post upper endoscopy 08/08/2014 with findings of severe ulcerative esophagitis distally; 7 mm anastomotic stricture status post dilatation to 13.5 mm.   Disposition:  Lynn Morris remains in clinical remission from gastroesophageal carcinoma. Her weight is stable. I encouraged her to eat frequent small meals. She plans to see Dr. Henrene Pastor next month. We will arrange for removal of the Port-A-Cath if she continues to have no dysphagia or new symptoms over the  next few  months. She will return for a Port-A-Cath flush in 6 weeks and an office visit in 3 months.  Betsy Coder, MD  11/14/2014  9:49 AM

## 2014-11-16 ENCOUNTER — Telehealth: Payer: Self-pay | Admitting: Oncology

## 2014-11-16 NOTE — Telephone Encounter (Signed)
Pt confirmed labs/flush .... Lynn Morris

## 2014-11-18 IMAGING — CR DG CHEST 2V
2 series · 2 of 2 positions shown · non-contrast
Comparison: Chest radiograph from 12/27/2013

CLINICAL DATA: Preoperative chest radiograph for esophageal cancer.

EXAM:
CHEST  2 VIEW

[w chest pa]
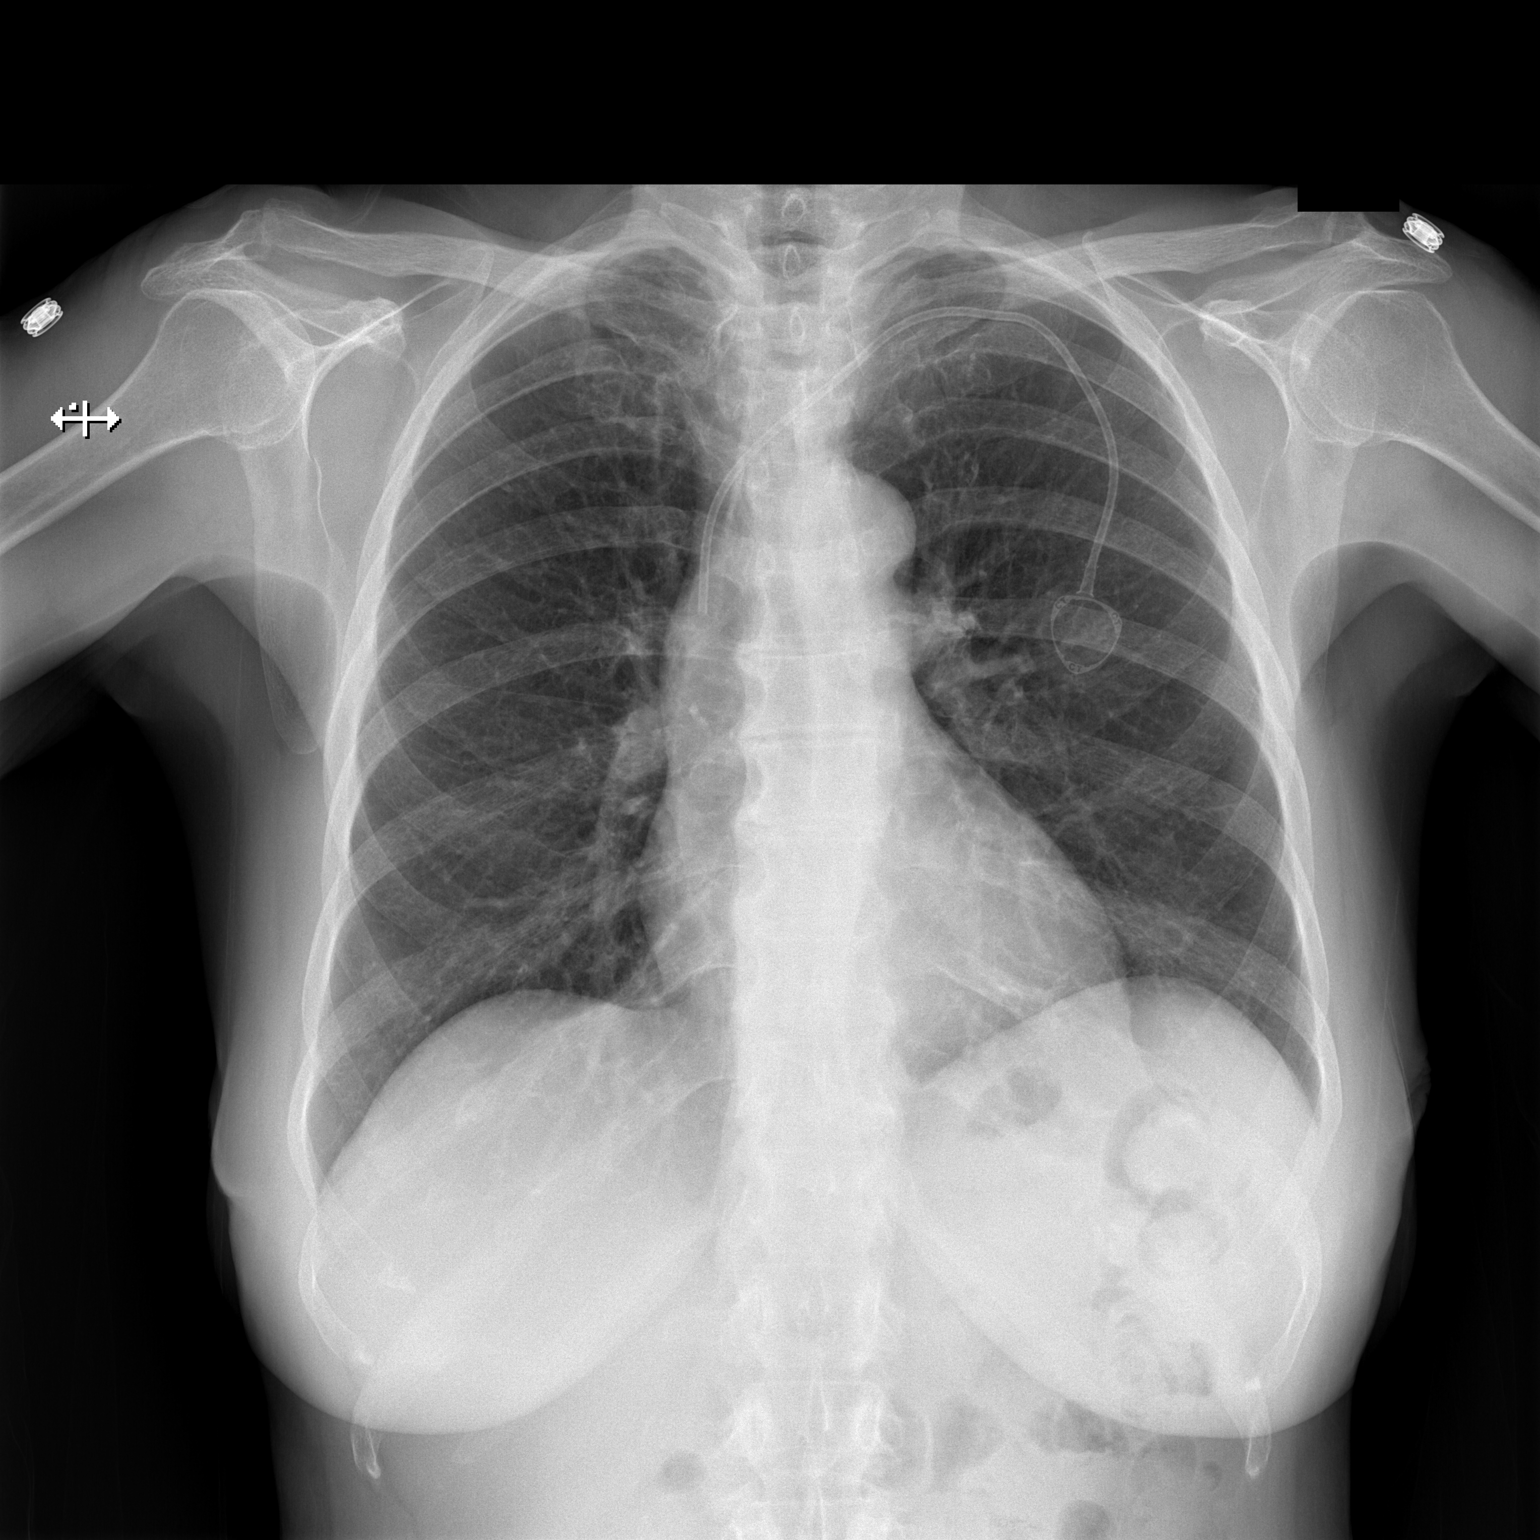

[w chest lat]
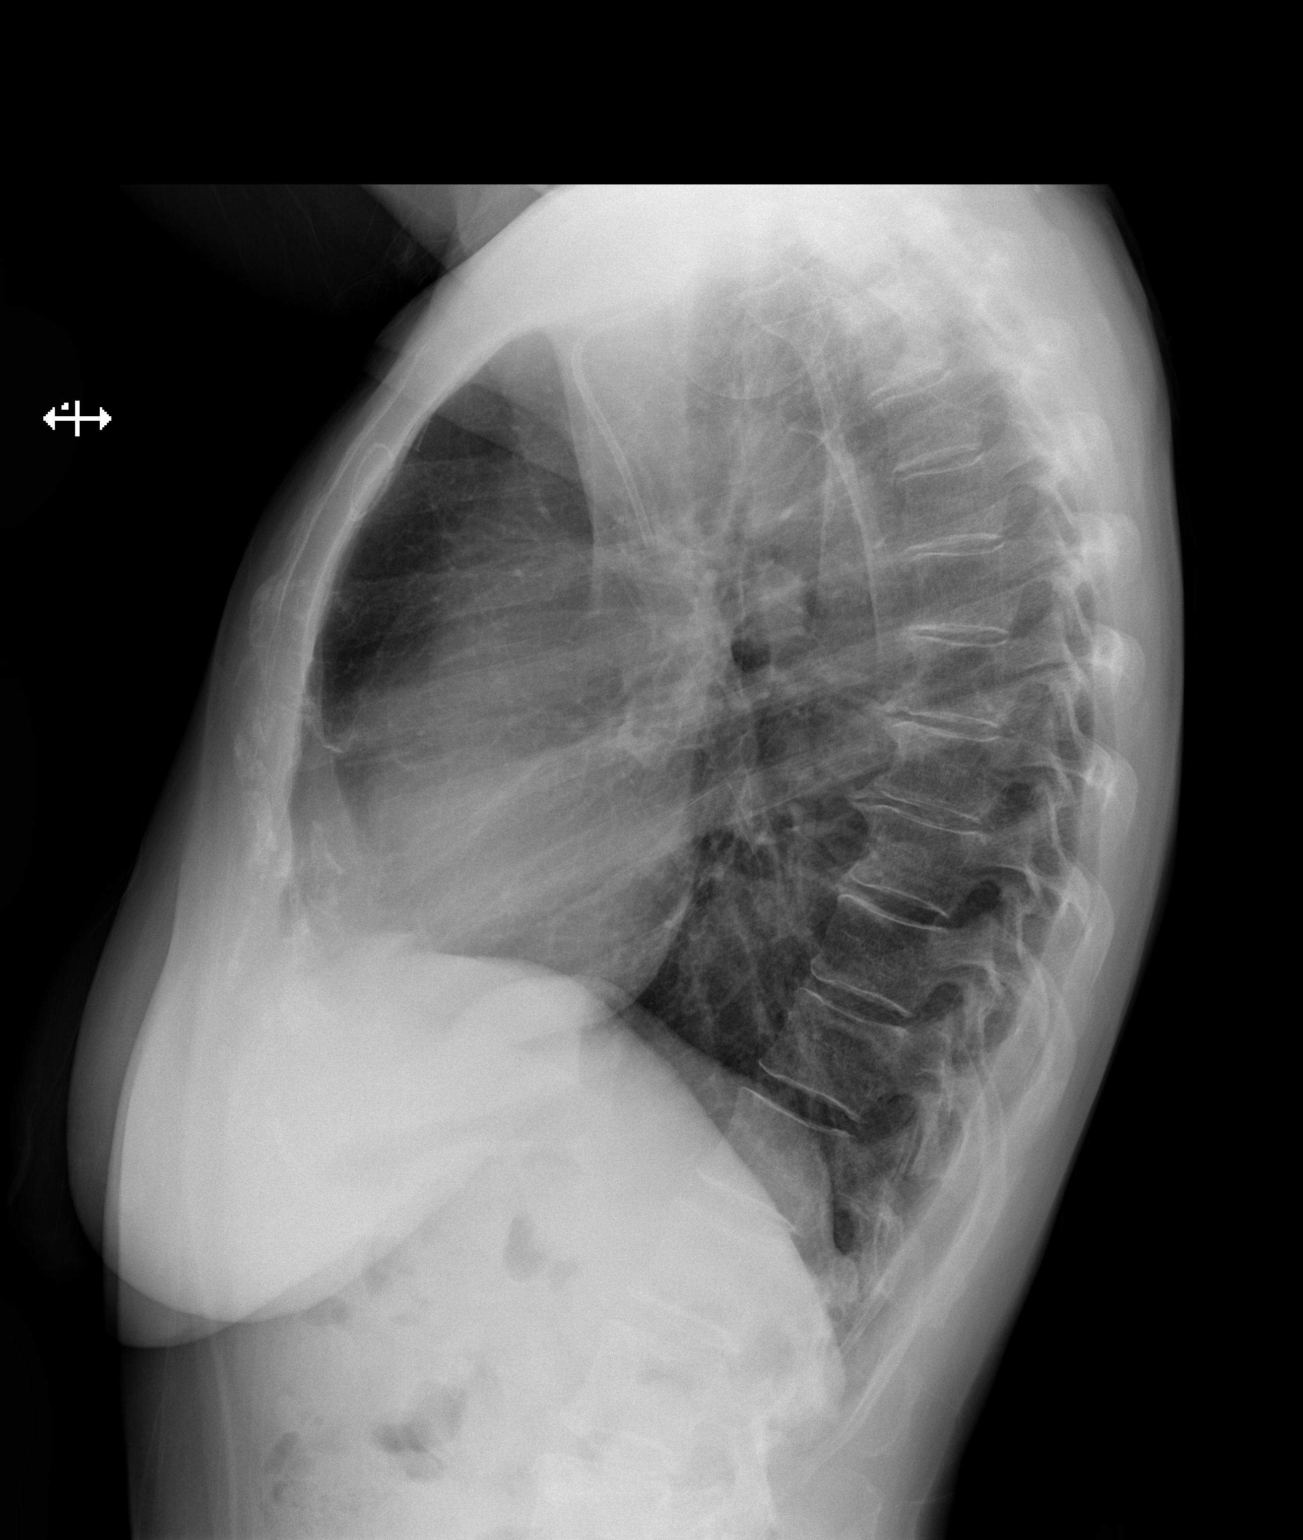

[2 of 2 positions shown; findings below may reference images not displayed]

FINDINGS: The lungs are well-aerated and clear. There is no evidence of focal
opacification, pleural effusion or pneumothorax.

The heart is normal in size; the mediastinal contour is within
normal limits. A left-sided chest port is noted ending at the mid
SVC. No acute osseous abnormalities are seen.
IMPRESSION: No acute cardiopulmonary process seen.

## 2014-11-24 IMAGING — CR DG UGI W/ GASTROGRAFIN
1 series · 1 of 1 positions shown · IV contrast (omnipaque)
Comparison: PET-CT 02/27/2014.

FLUOROSCOPY TIME:  36 seconds

CLINICAL DATA: Postop total gastrectomy 04/04/2014 for malignancy.
Evaluate for leak at esophagojejunostomy.

EXAM:
WATER SOLUBLE UPPER GI SERIES
TECHNIQUE: Single-column upper GI series was performed using water soluble
contrast.
CONTRAST:  50mL OMNIPAQUE IOHEXOL 300 MG/ML  SOLN

[view not recorded]
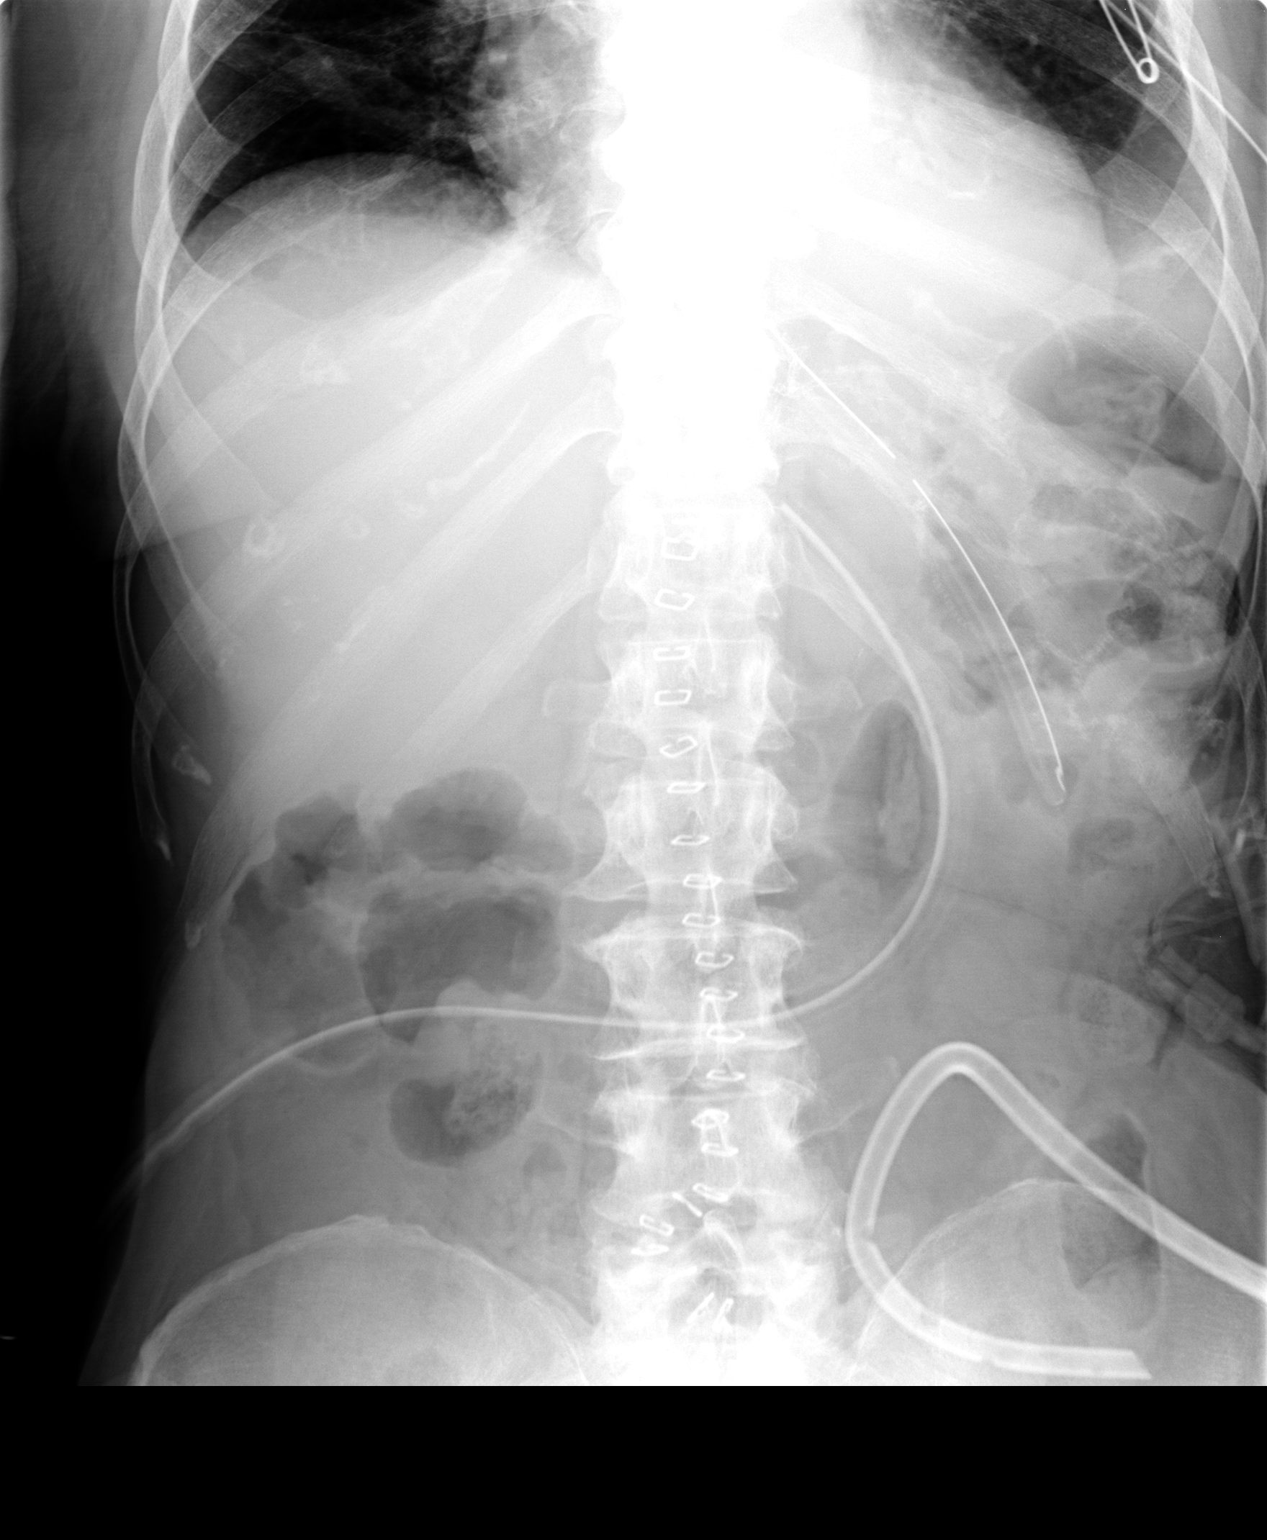

[1 of 1 positions shown; findings below may reference images not displayed]

FINDINGS: The scout abdominal radiograph demonstrates a nasogastric tube tip
in the left mid abdomen and surgical drains. There is no evidence of
bowel obstruction.

Patient swallowed the contrast without difficulty. The esophageal
motility is normal. The esophagojejunostomy is widely patent. There
is no evidence of leak at the anastomosis. Contrast passes into the
proximal jejunum. There is no bowel distention or evidence of bowel
obstruction.
IMPRESSION: No demonstrated complication following total gastrectomy and
esophagojejunostomy. No evidence of leak.

## 2014-11-29 ENCOUNTER — Telehealth: Payer: Self-pay | Admitting: Internal Medicine

## 2014-11-29 NOTE — Telephone Encounter (Signed)
Pt states she got a good report from Dr. Benay Spice and she can have her port-a-cath removed. States she would like to have her EGD done prior to having it removed. Pt is scheduled to see Dr. Henrene Pastor 12/13/14@9am . She states she is doing good and would like to just have her EGD done before the end of the month and then have port removed next month. Please advise.

## 2014-11-29 NOTE — Telephone Encounter (Signed)
Keep her office visit on 5-25. Put her in for EGD / dilation w/ proprfol on Tuesday 6-7 (am hospital block). She can arrange for porta cath removal sometime thereafter

## 2014-11-30 ENCOUNTER — Other Ambulatory Visit: Payer: Self-pay

## 2014-11-30 DIAGNOSIS — K222 Esophageal obstruction: Secondary | ICD-10-CM

## 2014-11-30 MED ORDER — SUCRALFATE 1 GM/10ML PO SUSP: ORAL | Status: DC

## 2014-11-30 NOTE — Telephone Encounter (Signed)
Needs refill of Carafet sent to [facility].

## 2014-11-30 NOTE — Telephone Encounter (Signed)
Pt aware and pt scheduled for EGD with dil with propofol at [facility] 12/26/14@9 :30am, pt to arrive there at 8am. Refill sent to pharmacy.

## 2014-12-04 ENCOUNTER — Other Ambulatory Visit (HOSPITAL_COMMUNITY): Payer: Self-pay | Admitting: Family Medicine

## 2014-12-04 DIAGNOSIS — Z1231 Encounter for screening mammogram for malignant neoplasm of breast: Secondary | ICD-10-CM

## 2014-12-08 ENCOUNTER — Telehealth: Payer: Self-pay | Admitting: Oncology

## 2014-12-08 NOTE — Telephone Encounter (Signed)
Returned patients call several times re 6/7 appointment and was not able to reach her or leave message - line rings busy.

## 2014-12-11 ENCOUNTER — Telehealth: Payer: Self-pay | Admitting: Oncology

## 2014-12-11 NOTE — Telephone Encounter (Signed)
Pt confirmed flush due to Hospital visit week before.... KJ

## 2014-12-13 ENCOUNTER — Ambulatory Visit (INDEPENDENT_AMBULATORY_CARE_PROVIDER_SITE_OTHER)
Admission: RE | Admit: 2014-12-13 | Discharge: 2014-12-13 | Disposition: A | Discharge status: Home / Self Care | Payer: Medicare Other | Source: Ambulatory Visit | Attending: Internal Medicine | Admitting: Internal Medicine

## 2014-12-13 ENCOUNTER — Ambulatory Visit (INDEPENDENT_AMBULATORY_CARE_PROVIDER_SITE_OTHER): Payer: Medicare Other | Admitting: Internal Medicine

## 2014-12-13 ENCOUNTER — Ambulatory Visit (HOSPITAL_COMMUNITY)
Admission: RE | Admit: 2014-12-13 | Discharge: 2014-12-13 | Disposition: A | Discharge status: Home / Self Care | Payer: Medicare Other | Source: Ambulatory Visit | Attending: Family Medicine | Admitting: Family Medicine

## 2014-12-13 ENCOUNTER — Encounter: Payer: Self-pay | Admitting: Internal Medicine

## 2014-12-13 VITALS — BP 98/68 | HR 80 | Ht 60.5 in | Wt 92.2 lb

## 2014-12-13 DIAGNOSIS — K222 Esophageal obstruction: Secondary | ICD-10-CM

## 2014-12-13 DIAGNOSIS — K209 Esophagitis, unspecified without bleeding: Secondary | ICD-10-CM

## 2014-12-13 DIAGNOSIS — Z85028 Personal history of other malignant neoplasm of stomach: Secondary | ICD-10-CM | POA: Diagnosis not present

## 2014-12-13 DIAGNOSIS — R109 Unspecified abdominal pain: Secondary | ICD-10-CM

## 2014-12-13 DIAGNOSIS — Z1231 Encounter for screening mammogram for malignant neoplasm of breast: Secondary | ICD-10-CM

## 2014-12-13 MED ORDER — IOHEXOL 300 MG/ML  SOLN
100.0000 mL | Freq: Once | INTRAMUSCULAR | Status: AC | PRN
  Administered 2014-12-13: 80 mL via INTRAVENOUS

## 2014-12-13 NOTE — Patient Instructions (Signed)
You have been scheduled for an endoscopy. Please follow written instructions given to you at your visit today. If you use inhalers (even only as needed), please bring them with you on the day of your procedure. Your physician has requested that you go to www.startemmi.com and enter the access code given to you at your visit today. This web site gives a general overview about your procedure. However, you should still follow specific instructions given to you by our office regarding your preparation for the procedure.  You have been scheduled for a CT scan of the abdomen and pelvis at [facility] ([address]---this is in the same building as Press photographer).   You are scheduled on TODAY at 1:00 pm. You should arrive 15 minutes prior to your appointment time for registration. Please follow the written instructions below on the day of your exam:  WARNING: IF YOU ARE ALLERGIC TO IODINE/X-RAY DYE, PLEASE NOTIFY RADIOLOGY IMMEDIATELY AT [phone]! YOU WILL BE GIVEN A 13 HOUR PREMEDICATION PREP.  1) Do not eat or drink anything after 9:00 am (4 hours prior to your test) 2) You have been given 2 bottles of oral contrast to drink. The solution may taste better if refrigerated, but do NOT add ice or any other liquid to this solution. Shake well before drinking.    Drink 1 bottle of contrast @ 11:00 am (2 hours prior to your exam)  Drink 1 bottle of contrast @ 12:00 pm (1 hour prior to your exam)  You may take any medications as prescribed with a small amount of water except for the following: Metformin, Glucophage, Glucovance, Avandamet, Riomet, Fortamet, Actoplus Met, Janumet, Glumetza or Metaglip. The above medications must be held the day of the exam AND 48 hours after the exam.  The purpose of you drinking the oral contrast is to aid in the visualization of your intestinal tract. The contrast solution may cause some diarrhea. Before your exam is started, you will be given a small  amount of fluid to drink. Depending on your individual set of symptoms, you may also receive an intravenous injection of x-ray contrast/dye. Plan on being at [facility] for 30 minutes or long, depending on the type of exam you are having performed.  This test typically takes 30-45 minutes to complete.  If you have any questions regarding your exam or if you need to reschedule, you may call the CT department at [phone] between the hours of 8:00 am and 5:00 pm, Monday-Friday.  ________________________________________________________________________

## 2014-12-13 NOTE — Progress Notes (Signed)
HISTORY OF PRESENT ILLNESS:  Lynn Morris is a 74 y.o. female with a history of gastric cancer diagnosed in May 2015 status post total gastrectomy with ileal pouch and ileal esophageal anastomosis. Previously received chemotherapy. Postoperative problems with anastomotic stricturing and severe ulceration of the esophagus. Last upper endoscopy performed 08/08/2014. Balloon dilation to a maximal diameter of 13.5 mm. I asked scratch that last office evaluation 09/11/2014. See that dictation. Patient presents today with her husband. She reports no significant swallowing difficulties, but is extremely careful when she chews her food. She continues on Carafate multiple times daily as requested. She is concerned about the status of her esophagus. Also, reports no problems with lower abdominal pain. Severe episode last evening described as tightness associated with vomiting. The problem persisted for several hours and kept her up at night. Feeling better this morning. Moving her bowels without difficulty. No bleeding. She has gained 2 pounds since her last visit. She does report anticipation of removal of the Port-A-Cath. She's tenderly scheduled for upper endoscopy June 7. Blood work 11/14/2014 reveals normal CBC with hemoglobin 12.3. Unremarkable comprehensive metabolic panel. Albumen 3.4. Last CT scan May 2015. PET scan August 2015  REVIEW OF SYSTEMS:  All non-GI ROS negative except for insomnia  Past Medical History  Diagnosis Date  . Esophageal stricture   . Hyperlipemia   . Arthritis     LG TOE  . Cataract     left eye and immature  . Complication of anesthesia   . PONV (postoperative nausea and vomiting)   . Hiatal hernia 12-13-13    intermittent nausea with vomiting  . History of blood transfusion     no abnormal reaction noted, s/p stomach surgery 9'15([facility])  . Pneumonia     touch of it many yrs ago(>36yrs)  . History of bronchitis > 6yrs ago  . History of migraine     many yrs ago  .  Esophageal reflux     was on Prilosec;has been off a yr  . Diverticulosis   . Anxiety     takes Zoloft every other day  . Optic neuritis     many yrs ago  . Cancer 11/24/13    gastroesophageal carcinoma  . Low blood pressure     thought to be related to dehydration LOV 08-01-14([facility]) given IV fluids  . Hypokalemia     tx. with potassium supplement    Past Surgical History  Procedure Laterality Date  . Abdominal hysterectomy  1978    Partial   . Cesarean section  1964 & 1966  . Nose surgery  1970  . Appendectomy    . Abdominal hysterectomy    . Eus N/A 12/22/2013    Procedure: UPPER ENDOSCOPIC ULTRASOUND (EUS) LINEAR;  Surgeon: Milus Banister, MD;  Location: [facility];  Service: Endoscopy;  Laterality: N/A;  . Portacath placement N/A 12/27/2013    Procedure: INSERTION PORT-A-CATH;  Surgeon: Stark Klein, MD;  Location: [facility];  Service: General;  Laterality: N/A;  . Esophagogastroduodenoscopy      with dilitation  . Colonoscopy    . Laparoscopy N/A 04/04/2014    Procedure: DIAGNOSTIC LAPAROSCOPY;  Surgeon: Stark Klein, MD;  Location: [place];  Service: General;  Laterality: N/A;  . Partial gastrectomy N/A 04/04/2014    Procedure: TOTAL GASTRECTOMY;  Surgeon: Stark Klein, MD;  Location: [place];  Service: General;  Laterality: N/A;  . Jejunostomy N/A 04/04/2014    Procedure: ESOPHAGOJEJUNOSTOMY AND FEEDING JEJUNOSTOMY;  Surgeon: Stark Klein, MD;  Location: [facility];  Service: General;  Laterality: N/A;/ then removal.  . Esophagogastroduodenoscopy (egd) with propofol N/A 06/26/2014    Procedure: ESOPHAGOGASTRODUODENOSCOPY (EGD) WITH PROPOFOL;  Surgeon: Irene Shipper, MD;  Location: [facility];  Service: Endoscopy;  Laterality: N/A;  . Balloon dilation N/A 06/26/2014    Procedure: BALLOON DILATION;  Surgeon: Irene Shipper, MD;  Location: [facility];  Service: Endoscopy;  Laterality: N/A;  . Esophagogastroduodenoscopy N/A 08/08/2014    Procedure: ESOPHAGOGASTRODUODENOSCOPY (EGD);  Surgeon:  Irene Shipper, MD;  Location: [facility];  Service: Endoscopy;  Laterality: N/A;  . Balloon dilation N/A 08/08/2014    Procedure: BALLOON DILATION;  Surgeon: Irene Shipper, MD;  Location: [facility];  Service: Endoscopy;  Laterality: N/A;    Social History Lynn Morris  reports that she has never smoked. She has never used smokeless tobacco. She reports that she does not drink alcohol or use illicit drugs.  family history includes Diabetes in her mother; Hypertension in her mother; Pancreatic cancer in her mother. There is no history of Colon cancer.  Allergies  Allergen Reactions  . Codeine     Unknown- patient does not remember       PHYSICAL EXAMINATION:  Vital signs: BP 98/68 mmHg  Pulse 80  Ht 5' 0.5" (1.537 m)  Wt 92 lb 4 oz (41.844 kg)  BMI 17.71 kg/m2 General: Well-developed, well-nourished, no acute distress HEENT: Sclerae are anicteric, conjunctiva pink. Oral mucosa intact Lungs: Clear Heart: Regular Abdomen: soft, large well-healed midline incision, slight tenderness and firmness in the lower abdomen to palpation, nondistended, no obvious ascites, no peritoneal signs, normal bowel sounds. No organomegaly. Extremities: No edema Psychiatric: alert and oriented x3. Cooperative   ASSESSMENT:  #1. Gastric cancer diagnosed May 2015 status post total gastrectomy with ileal pouch and ileal esophageal anastomosis . Course of chemotherapy remotely. #2. History of severe postoperative esophagitis with anastomotic stricture requiring esophageal dilation. Swallowing improved, though not normal. On Carafate #3. Recurrent problems with lower abdominal pain as described. Rule out partial bowel obstruction. Rule out recurrent intra-abdominal cancer   PLAN:  #1. Contrast-enhanced CT scan of the abdomen and pelvis "history of gastric cancer, abdominal pain, rule out recurrent disease" #2. Schedule upper endoscopy with possible esophageal dilation. She is high risk given the anatomy  and previous problems with procedure related bleeding requiring overnight hospital observation.The nature of the procedure, as well as the risks, benefits, and alternatives were carefully and thoroughly reviewed with the patient. Ample time for discussion and questions allowed. The patient understood, was satisfied, and agreed to proceed.

## 2014-12-14 ENCOUNTER — Telehealth: Payer: Self-pay | Admitting: Internal Medicine

## 2014-12-14 NOTE — Telephone Encounter (Signed)
Pt is scheduled for EGD at [facility] 12/26/14. Pt had last colon done in 2012. Pt wants to know if Dr. Henrene Pastor thinks she should have a colon done now also. Pt still has her Port in place and wanted to know what Dr. Henrene Pastor thinks. Please advise.

## 2014-12-18 NOTE — Telephone Encounter (Signed)
No colon. Not due till 2022

## 2014-12-19 ENCOUNTER — Encounter (HOSPITAL_COMMUNITY): Payer: Self-pay | Admitting: *Deleted

## 2014-12-19 NOTE — Telephone Encounter (Signed)
Spoke with pt and she is aware.

## 2014-12-21 ENCOUNTER — Other Ambulatory Visit: Payer: Self-pay | Admitting: Internal Medicine

## 2014-12-21 ENCOUNTER — Telehealth: Payer: Self-pay | Admitting: Internal Medicine

## 2014-12-21 MED ORDER — SUCRALFATE 1 GM/10ML PO SUSP: ORAL | Status: AC

## 2014-12-21 NOTE — Telephone Encounter (Signed)
With patient on the phone, I refilled her Carafate with 12 refills which equals 6 months at her dosage.  I told patient to call if the pharmacy had any problem with it.  Patient agreed

## 2014-12-22 NOTE — Telephone Encounter (Signed)
carafate refilled 12-21-2014

## 2014-12-26 ENCOUNTER — Ambulatory Visit (HOSPITAL_COMMUNITY): Payer: Medicare Other | Admitting: Registered Nurse

## 2014-12-26 ENCOUNTER — Telehealth: Payer: Self-pay | Admitting: Oncology

## 2014-12-26 ENCOUNTER — Encounter (HOSPITAL_COMMUNITY)
Admission: RE | Disposition: A | Discharge status: Home / Self Care | Payer: Self-pay | Source: Ambulatory Visit | Attending: Internal Medicine

## 2014-12-26 ENCOUNTER — Encounter (HOSPITAL_COMMUNITY): Payer: Self-pay

## 2014-12-26 ENCOUNTER — Ambulatory Visit (HOSPITAL_COMMUNITY)
Admission: RE | Admit: 2014-12-26 | Discharge: 2014-12-26 | Disposition: A | Discharge status: Home / Self Care | Payer: Medicare Other | Source: Ambulatory Visit | Attending: Internal Medicine | Admitting: Internal Medicine

## 2014-12-26 DIAGNOSIS — R1310 Dysphagia, unspecified: Secondary | ICD-10-CM | POA: Diagnosis present

## 2014-12-26 DIAGNOSIS — K219 Gastro-esophageal reflux disease without esophagitis: Secondary | ICD-10-CM | POA: Diagnosis not present

## 2014-12-26 DIAGNOSIS — K222 Esophageal obstruction: Secondary | ICD-10-CM | POA: Diagnosis not present

## 2014-12-26 DIAGNOSIS — K209 Esophagitis, unspecified: Secondary | ICD-10-CM | POA: Diagnosis not present

## 2014-12-26 HISTORY — PX: BALLOON DILATION: SHX5330

## 2014-12-26 HISTORY — PX: ESOPHAGOGASTRODUODENOSCOPY: SHX5428

## 2014-12-26 SURGERY — EGD (ESOPHAGOGASTRODUODENOSCOPY)
Anesthesia: Monitor Anesthesia Care

## 2014-12-26 MED ORDER — LACTATED RINGERS IV SOLN
INTRAVENOUS | Status: DC | PRN
  Administered 2014-12-26: 10:00:00 via INTRAVENOUS

## 2014-12-26 MED ORDER — GLYCOPYRROLATE 0.2 MG/ML IJ SOLN
INTRAMUSCULAR | Status: DC | PRN
  Administered 2014-12-26: 0.2 mg via INTRAVENOUS

## 2014-12-26 MED ORDER — PROPOFOL 10 MG/ML IV BOLUS
INTRAVENOUS | Status: AC
  Filled 2014-12-26: qty 20

## 2014-12-26 MED ORDER — ONDANSETRON HCL 4 MG/2ML IJ SOLN
INTRAMUSCULAR | Status: DC | PRN
  Administered 2014-12-26: 4 mg via INTRAVENOUS

## 2014-12-26 MED ORDER — HEPARIN SOD (PORK) LOCK FLUSH 100 UNIT/ML IV SOLN
500.0000 [IU] | INTRAVENOUS | Status: DC | PRN
  Administered 2014-12-26: 500 [IU]

## 2014-12-26 MED ORDER — SODIUM CHLORIDE 0.9 % IV SOLN: INTRAVENOUS | Status: DC

## 2014-12-26 MED ORDER — SODIUM CHLORIDE 0.9 % IJ SOLN: 10.0000 mL | Freq: Two times a day (BID) | INTRAMUSCULAR | Status: DC

## 2014-12-26 MED ORDER — HEPARIN SOD (PORK) LOCK FLUSH 100 UNIT/ML IV SOLN: 500.0000 [IU] | INTRAVENOUS | Status: DC

## 2014-12-26 MED ORDER — SODIUM CHLORIDE 0.9 % IJ SOLN: 10.0000 mL | INTRAMUSCULAR | Status: DC | PRN

## 2014-12-26 MED ORDER — LIDOCAINE HCL (CARDIAC) 20 MG/ML IV SOLN
INTRAVENOUS | Status: AC
  Filled 2014-12-26: qty 5

## 2014-12-26 MED ORDER — LIDOCAINE HCL (CARDIAC) 20 MG/ML IV SOLN
INTRAVENOUS | Status: DC | PRN
  Administered 2014-12-26: 75 mg via INTRAVENOUS

## 2014-12-26 MED ORDER — ONDANSETRON HCL 4 MG/2ML IJ SOLN
INTRAMUSCULAR | Status: AC
  Filled 2014-12-26: qty 2

## 2014-12-26 MED ORDER — GLYCOPYRROLATE 0.2 MG/ML IJ SOLN
INTRAMUSCULAR | Status: AC
  Filled 2014-12-26: qty 1

## 2014-12-26 MED ORDER — PROPOFOL INFUSION 10 MG/ML OPTIME
INTRAVENOUS | Status: DC | PRN
  Administered 2014-12-26: 500 ug/kg/min via INTRAVENOUS

## 2014-12-26 NOTE — Op Note (Signed)
[facility] [place], [address]   1ENDOSCOPY PROCEDURE REPORT  PATIENT: Lynn, Morris  MR#: [id number] BIRTHDATE: June 26, 1941 , 74  yrs. old GENDER: female ENDOSCOPIST: Eustace Quail, MD REFERRED BY:  .  Self / Office PROCEDURE DATE:  12/26/2014 PROCEDURE:  EGD w/ balloon dilation ?"  12, 13.5, 15 mm ASA CLASS:     Class III INDICATIONS:  dysphagia and therapeutic procedure. Patient with a history of total gastrectomy with Roux-en-Y esophagojejunostomy. Last dilation 08/08/2014. MEDICATIONS: Monitored anesthesia care and Per Anesthesia TOPICAL ANESTHETIC: none  DESCRIPTION OF PROCEDURE: After the risks benefits and alternatives of the procedure were thoroughly explained, informed consent was obtained.  The [place] [id number] endoscope was introduced through the mouth and advanced to the second portion of the duodenum , Without limitations.  The instrument was slowly withdrawn as the mucosa was fully examined.  EXAM:Esophagus reveals linear erythema in the distal one half with contact friability.  However, overall significantly improved from previous examinations.  There was an anastomotic stricture, with improved diameter from previous exam.  The post anastomotic small bowel was unremarkable.THERAPY: A sequential TTS balloon was passed through the endoscope and the stricture was dilated at 12 mm, 13.5 mm, and 15 mm with minimal resistance.  No bleeding precipitated. Tolerated well.  Retroflexion was not performed.     The scope was then withdrawn from the patient and the procedure completed.  COMPLICATIONS: There were no immediate complications.  ENDOSCOPIC IMPRESSION: 1. History of total gastrectomy with Roux-en-Y esophagojejunostomy 2. Improved esophagitis and anastomotic stricture status post dilation to a maximal diameter of 15 mm  RECOMMENDATIONS: 1.  Clear liquids until 12 noon, then soft foods rest of day. Resume prior diet  tomorrow. 2.  Continue current medications 3. Routine GI office follow-up in 6 months  REPEAT EXAM:  eSigned:  Eustace Quail, MD 12/26/2014 9:58 AM    WE:[id number] Benay Spice, MD and Stark Klein, MD

## 2014-12-26 NOTE — Discharge Instructions (Signed)
Esophagogastroduodenoscopy °Care After °Refer to this sheet in the next few weeks. These instructions provide you with information on caring for yourself after your procedure. Your caregiver may also give you more specific instructions. Your treatment has been planned according to current medical practices, but problems sometimes occur. Call your caregiver if you have any problems or questions after your procedure.  °HOME CARE INSTRUCTIONS °· Do not eat or drink anything until the numbing medicine (local anesthetic) has worn off and your gag reflex has returned. You will know that the local anesthetic has worn off when you can swallow comfortably. °· Do not drive for 12 hours after the procedure or as directed by your caregiver. °· Only take medicines as directed by your caregiver. °SEEK MEDICAL CARE IF:  °· You cannot stop coughing. °· You are not urinating at all or less than usual. °SEEK IMMEDIATE MEDICAL CARE IF: °· You have difficulty swallowing. °· You cannot eat or drink. °· You have worsening throat or chest pain. °· You have dizziness, lightheadedness, or you faint. °· You have nausea or vomiting. °· You have chills. °· You have a fever. °· You have severe abdominal pain. °· You have black, tarry, or bloody stools. °Document Released: 06/23/2012 Document Reviewed: 06/23/2012 °ExitCare® Patient Information ©2015 [employer]. This information is not intended to replace advice given to you by your health care provider. Make sure you discuss any questions you have with your health care provider. ° ° °Conscious Sedation, Adult, Care After °Refer to this sheet in the next few weeks. These instructions provide you with information on caring for yourself after your procedure. Your health care provider may also give you more specific instructions. Your treatment has been planned according to current medical practices, but problems sometimes occur. Call your health care provider if you have any problems or  questions after your procedure. °WHAT TO EXPECT AFTER THE PROCEDURE  °After your procedure: °· You may feel sleepy, clumsy, and have poor balance for several hours. °· Vomiting may occur if you eat too soon after the procedure. °HOME CARE INSTRUCTIONS °· Do not participate in any activities where you could become injured for at least 24 hours. Do not: °¨ Drive. °¨ Swim. °¨ Ride a bicycle. °¨ Operate heavy machinery. °¨ Cook. °¨ Use power tools. °¨ Climb ladders. °¨ Work from a high place. °· Do not make important decisions or sign legal documents until you are improved. °· If you vomit, drink water, juice, or soup when you can drink without vomiting. Make sure you have little or no nausea before eating solid foods. °· Only take over-the-counter or prescription medicines for pain, discomfort, or fever as directed by your health care provider. °· Make sure you and your family fully understand everything about the medicines given to you, including what side effects may occur. °· You should not drink alcohol, take sleeping pills, or take medicines that cause drowsiness for at least 24 hours. °· If you smoke, do not smoke without supervision. °· If you are feeling better, you may resume normal activities 24 hours after you were sedated. °· Keep all appointments with your health care provider. °SEEK MEDICAL CARE IF: °· Your skin is pale or bluish in color. °· You continue to feel nauseous or vomit. °· Your pain is getting worse and is not helped by medicine. °· You have bleeding or swelling. °· You are still sleepy or feeling clumsy after 24 hours. °SEEK IMMEDIATE MEDICAL CARE IF: °· You develop a   rash. °· You have difficulty breathing. °· You develop any type of allergic problem. °· You have a fever. °MAKE SURE YOU: °· Understand these instructions. °· Will watch your condition. °· Will get help right away if you are not doing well or get worse. °Document Released: 04/27/2013 Document Reviewed: 04/27/2013 °ExitCare®  Patient Information ©2015 [employer]. This information is not intended to replace advice given to you by your health care provider. Make sure you discuss any questions you have with your health care provider. ° °

## 2014-12-26 NOTE — Interval H&P Note (Signed)
History and Physical Interval Note:  12/26/2014 8:47 AM  [name]  has presented today for surgery, with the diagnosis of Esophageal Stricture  The various methods of treatment have been discussed with the patient and family. After consideration of risks, benefits and other options for treatment, the patient has consented to  Procedure(s): ESOPHAGOGASTRODUODENOSCOPY (EGD) (N/A) BALLOON DILATION (N/A) as a surgical intervention .  The patient's history has been reviewed, patient examined, no change in status, stable for surgery.  I have reviewed the patient's chart and labs.  Questions were answered to the patient's satisfaction.     Lynn Morris

## 2014-12-26 NOTE — Transfer of Care (Signed)
Immediate Anesthesia Transfer of Care Note  Patient: Lynn Morris  Procedure(s) Performed: Procedure(s): ESOPHAGOGASTRODUODENOSCOPY (EGD) (N/A) BALLOON DILATION (N/A)  Patient Location: PACU  Anesthesia Type:MAC  Level of Consciousness: sedated and patient cooperative  Airway & Oxygen Therapy: Patient Spontanous Breathing and Patient connected to nasal cannula oxygen  Post-op Assessment: Report given to RN and Post -op Vital signs reviewed and stable  Post vital signs: stable  Last Vitals:  Filed Vitals:   12/26/14 1002  BP: 82/46  Pulse:   Temp: 36.4 C  Resp: 16    Complications: No apparent anesthesia complications

## 2014-12-26 NOTE — Telephone Encounter (Signed)
Pt called to cancel flush due to her port was flushed today during her Endoscopy surgery, Amy will contact pt concerning port removal once MD has spoken with Dr. Henrene Pastor... KJ

## 2014-12-26 NOTE — Anesthesia Preprocedure Evaluation (Signed)
Anesthesia Evaluation  Patient identified by MRN, date of birth, ID band Patient awake    Reviewed: Allergy & Precautions, NPO status , Patient's Chart, lab work & pertinent test results  History of Anesthesia Complications (+) PONV  Airway Mallampati: II  TM Distance: >3 FB Neck ROM: Full    Dental  (+) Teeth Intact, Dental Advisory Given   Pulmonary neg pulmonary ROS,    Pulmonary exam normal       Cardiovascular negative cardio ROS Normal cardiovascular exam    Neuro/Psych PSYCHIATRIC DISORDERS Anxiety Depression    GI/Hepatic Neg liver ROS, hiatal hernia, GERD-  ,  Endo/Other    Renal/GU negative Renal ROS     Musculoskeletal   Abdominal   Peds  Hematology negative hematology ROS (+)   Anesthesia Other Findings   Reproductive/Obstetrics                             Anesthesia Physical  Anesthesia Plan  ASA: III  Anesthesia Plan: MAC   Post-op Pain Management:    Induction: Intravenous  Airway Management Planned: Simple Face Mask  Additional Equipment:   Intra-op Plan:   Post-operative Plan:   Informed Consent: I have reviewed the patients History and Physical, chart, labs and discussed the procedure including the risks, benefits and alternatives for the proposed anesthesia with the patient or authorized representative who has indicated his/her understanding and acceptance.   Dental advisory given  Plan Discussed with:   Anesthesia Plan Comments:         Anesthesia Quick Evaluation

## 2014-12-26 NOTE — H&P (View-Only) (Signed)
HISTORY OF PRESENT ILLNESS:  Lynn Morris is a 74 y.o. female with a history of gastric cancer diagnosed in May 2015 status post total gastrectomy with ileal pouch and ileal esophageal anastomosis. Previously received chemotherapy. Postoperative problems with anastomotic stricturing and severe ulceration of the esophagus. Last upper endoscopy performed 08/08/2014. Balloon dilation to a maximal diameter of 13.5 mm. I asked scratch that last office evaluation 09/11/2014. See that dictation. Patient presents today with her husband. She reports no significant swallowing difficulties, but is extremely careful when she chews her food. She continues on Carafate multiple times daily as requested. She is concerned about the status of her esophagus. Also, reports no problems with lower abdominal pain. Severe episode last evening described as tightness associated with vomiting. The problem persisted for several hours and kept her up at night. Feeling better this morning. Moving her bowels without difficulty. No bleeding. She has gained 2 pounds since her last visit. She does report anticipation of removal of the Port-A-Cath. She's tenderly scheduled for upper endoscopy June 7. Blood work 11/14/2014 reveals normal CBC with hemoglobin 12.3. Unremarkable comprehensive metabolic panel. Albumen 3.4. Last CT scan May 2015. PET scan August 2015  REVIEW OF SYSTEMS:  All non-GI ROS negative except for insomnia  Past Medical History  Diagnosis Date  . Esophageal stricture   . Hyperlipemia   . Arthritis     LG TOE  . Cataract     left eye and immature  . Complication of anesthesia   . PONV (postoperative nausea and vomiting)   . Hiatal hernia 12-13-13    intermittent nausea with vomiting  . History of blood transfusion     no abnormal reaction noted, s/p stomach surgery 9'15([facility])  . Pneumonia     touch of it many yrs ago(>86yrs)  . History of bronchitis > 73yrs ago  . History of migraine     many yrs ago  .  Esophageal reflux     was on Prilosec;has been off a yr  . Diverticulosis   . Anxiety     takes Zoloft every other day  . Optic neuritis     many yrs ago  . Cancer 11/24/13    gastroesophageal carcinoma  . Low blood pressure     thought to be related to dehydration LOV 08-01-14([facility]) given IV fluids  . Hypokalemia     tx. with potassium supplement    Past Surgical History  Procedure Laterality Date  . Abdominal hysterectomy  1978    Partial   . Cesarean section  1964 & 1966  . Nose surgery  1970  . Appendectomy    . Abdominal hysterectomy    . Eus N/A 12/22/2013    Procedure: UPPER ENDOSCOPIC ULTRASOUND (EUS) LINEAR;  Surgeon: Milus Banister, MD;  Location: [facility];  Service: Endoscopy;  Laterality: N/A;  . Portacath placement N/A 12/27/2013    Procedure: INSERTION PORT-A-CATH;  Surgeon: Stark Klein, MD;  Location: [place];  Service: General;  Laterality: N/A;  . Esophagogastroduodenoscopy      with dilitation  . Colonoscopy    . Laparoscopy N/A 04/04/2014    Procedure: DIAGNOSTIC LAPAROSCOPY;  Surgeon: Stark Klein, MD;  Location: [place];  Service: General;  Laterality: N/A;  . Partial gastrectomy N/A 04/04/2014    Procedure: TOTAL GASTRECTOMY;  Surgeon: Stark Klein, MD;  Location: [place];  Service: General;  Laterality: N/A;  . Jejunostomy N/A 04/04/2014    Procedure: ESOPHAGOJEJUNOSTOMY AND FEEDING JEJUNOSTOMY;  Surgeon: Stark Klein, MD;  Location: [facility];  Service: General;  Laterality: N/A;/ then removal.  . Esophagogastroduodenoscopy (egd) with propofol N/A 06/26/2014    Procedure: ESOPHAGOGASTRODUODENOSCOPY (EGD) WITH PROPOFOL;  Surgeon: Irene Shipper, MD;  Location: [facility];  Service: Endoscopy;  Laterality: N/A;  . Balloon dilation N/A 06/26/2014    Procedure: BALLOON DILATION;  Surgeon: Irene Shipper, MD;  Location: [facility];  Service: Endoscopy;  Laterality: N/A;  . Esophagogastroduodenoscopy N/A 08/08/2014    Procedure: ESOPHAGOGASTRODUODENOSCOPY (EGD);  Surgeon:  Irene Shipper, MD;  Location: [facility];  Service: Endoscopy;  Laterality: N/A;  . Balloon dilation N/A 08/08/2014    Procedure: BALLOON DILATION;  Surgeon: Irene Shipper, MD;  [facility]: [facility];  Service: Endoscopy;  Laterality: N/A;    Social History Lynn Morris  reports that she has never smoked. She has never used smokeless tobacco. She reports that she does not drink alcohol or use illicit drugs.  family history includes Diabetes in her mother; Hypertension in her mother; Pancreatic cancer in her mother. There is no history of Colon cancer.  Allergies  Allergen Reactions  . Codeine     Unknown- patient does not remember       PHYSICAL EXAMINATION:  Vital signs: BP 98/68 mmHg  Pulse 80  Ht 5' 0.5" (1.537 m)  Wt 92 lb 4 oz (41.844 kg)  BMI 17.71 kg/m2 General: Well-developed, well-nourished, no acute distress HEENT: Sclerae are anicteric, conjunctiva pink. Oral mucosa intact Lungs: Clear Heart: Regular Abdomen: soft, large well-healed midline incision, slight tenderness and firmness in the lower abdomen to palpation, nondistended, no obvious ascites, no peritoneal signs, normal bowel sounds. No organomegaly. Extremities: No edema Psychiatric: alert and oriented x3. Cooperative   ASSESSMENT:  #1. Gastric cancer diagnosed May 2015 status post total gastrectomy with ileal pouch and ileal esophageal anastomosis . Course of chemotherapy remotely. #2. History of severe postoperative esophagitis with anastomotic stricture requiring esophageal dilation. Swallowing improved, though not normal. On Carafate #3. Recurrent problems with lower abdominal pain as described. Rule out partial bowel obstruction. Rule out recurrent intra-abdominal cancer   PLAN:  #1. Contrast-enhanced CT scan of the abdomen and pelvis "history of gastric cancer, abdominal pain, rule out recurrent disease" #2. Schedule upper endoscopy with possible esophageal dilation. She is high risk given the anatomy  and previous problems with procedure related bleeding requiring overnight hospital observation.The nature of the procedure, as well as the risks, benefits, and alternatives were carefully and thoroughly reviewed with the patient. Ample time for discussion and questions allowed. The patient understood, was satisfied, and agreed to proceed.

## 2014-12-26 NOTE — Anesthesia Postprocedure Evaluation (Signed)
  Anesthesia Post-op Note  Patient: Lynn Morris  Procedure(s) Performed: Procedure(s) (LRB): ESOPHAGOGASTRODUODENOSCOPY (EGD) (N/A) BALLOON DILATION (N/A)  Patient Location: PACU  Anesthesia Type: MAC  Level of Consciousness: awake and alert   Airway and Oxygen Therapy: Patient Spontanous Breathing  Post-op Pain: mild  Post-op Assessment: Post-op Vital signs reviewed, Patient's Cardiovascular Status Stable, Respiratory Function Stable, Patent Airway and No signs of Nausea or vomiting  Last Vitals:  Filed Vitals:   12/26/14 1040  BP:   Pulse: 59  Temp:   Resp: 17    Post-op Vital Signs: stable   Complications: No apparent anesthesia complications

## 2014-12-27 ENCOUNTER — Telehealth: Payer: Self-pay | Admitting: *Deleted

## 2014-12-27 NOTE — Telephone Encounter (Signed)
Left message for pt to call back re: her question if she can get her port removed.

## 2014-12-27 NOTE — Telephone Encounter (Signed)
Pt returned call; pt reports that Dr. Henrene Pastor cleared her with "everything, I didn't have a small bowel obstruction; Dr. Henrene Pastor looked at everything and said it was just old scar tissue from surgeries long ago" and she is feeling well and wants to go ahead and get port taken out.  Per Dr. Benay Spice; informed pt that if Dr. Henrene Pastor cleared her that Dr. Benay Spice is okay with pt calling Dr. Marlowe Aschoff office to set up port removal.  Pt verbalized understanding and confirmed appt for 02/06/15.

## 2014-12-28 ENCOUNTER — Other Ambulatory Visit: Payer: Self-pay | Admitting: General Surgery

## 2014-12-28 ENCOUNTER — Encounter (HOSPITAL_COMMUNITY): Payer: Self-pay | Admitting: Internal Medicine

## 2014-12-29 ENCOUNTER — Other Ambulatory Visit: Payer: Self-pay | Admitting: General Surgery

## 2015-01-05 ENCOUNTER — Encounter (HOSPITAL_BASED_OUTPATIENT_CLINIC_OR_DEPARTMENT_OTHER): Payer: Self-pay | Admitting: *Deleted

## 2015-01-09 ENCOUNTER — Ambulatory Visit (HOSPITAL_BASED_OUTPATIENT_CLINIC_OR_DEPARTMENT_OTHER)
Admission: RE | Admit: 2015-01-09 | Discharge: 2015-01-09 | Disposition: A | Discharge status: Home / Self Care | Payer: Medicare Other | Source: Ambulatory Visit | Attending: General Surgery | Admitting: General Surgery

## 2015-01-09 ENCOUNTER — Ambulatory Visit (HOSPITAL_BASED_OUTPATIENT_CLINIC_OR_DEPARTMENT_OTHER): Payer: Medicare Other | Admitting: Anesthesiology

## 2015-01-09 ENCOUNTER — Encounter (HOSPITAL_BASED_OUTPATIENT_CLINIC_OR_DEPARTMENT_OTHER): Payer: Self-pay | Admitting: *Deleted

## 2015-01-09 ENCOUNTER — Encounter (HOSPITAL_BASED_OUTPATIENT_CLINIC_OR_DEPARTMENT_OTHER)
Admission: RE | Disposition: A | Discharge status: Home / Self Care | Payer: Self-pay | Source: Ambulatory Visit | Attending: General Surgery

## 2015-01-09 DIAGNOSIS — C169 Malignant neoplasm of stomach, unspecified: Secondary | ICD-10-CM | POA: Insufficient documentation

## 2015-01-09 DIAGNOSIS — Z800 Family history of malignant neoplasm of digestive organs: Secondary | ICD-10-CM | POA: Insufficient documentation

## 2015-01-09 DIAGNOSIS — K5790 Diverticulosis of intestine, part unspecified, without perforation or abscess without bleeding: Secondary | ICD-10-CM | POA: Diagnosis not present

## 2015-01-09 DIAGNOSIS — Z885 Allergy status to narcotic agent status: Secondary | ICD-10-CM | POA: Insufficient documentation

## 2015-01-09 DIAGNOSIS — Z888 Allergy status to other drugs, medicaments and biological substances status: Secondary | ICD-10-CM | POA: Diagnosis not present

## 2015-01-09 DIAGNOSIS — K449 Diaphragmatic hernia without obstruction or gangrene: Secondary | ICD-10-CM | POA: Insufficient documentation

## 2015-01-09 DIAGNOSIS — F419 Anxiety disorder, unspecified: Secondary | ICD-10-CM | POA: Diagnosis not present

## 2015-01-09 DIAGNOSIS — Z8249 Family history of ischemic heart disease and other diseases of the circulatory system: Secondary | ICD-10-CM | POA: Diagnosis not present

## 2015-01-09 DIAGNOSIS — F329 Major depressive disorder, single episode, unspecified: Secondary | ICD-10-CM | POA: Insufficient documentation

## 2015-01-09 DIAGNOSIS — Z903 Acquired absence of stomach [part of]: Secondary | ICD-10-CM | POA: Insufficient documentation

## 2015-01-09 DIAGNOSIS — Z452 Encounter for adjustment and management of vascular access device: Secondary | ICD-10-CM | POA: Insufficient documentation

## 2015-01-09 DIAGNOSIS — K219 Gastro-esophageal reflux disease without esophagitis: Secondary | ICD-10-CM | POA: Insufficient documentation

## 2015-01-09 DIAGNOSIS — E785 Hyperlipidemia, unspecified: Secondary | ICD-10-CM | POA: Diagnosis not present

## 2015-01-09 DIAGNOSIS — Z833 Family history of diabetes mellitus: Secondary | ICD-10-CM | POA: Diagnosis not present

## 2015-01-09 HISTORY — PX: PORT-A-CATH REMOVAL: SHX5289

## 2015-01-09 HISTORY — DX: Headache, unspecified: R51.9

## 2015-01-09 HISTORY — DX: Headache: R51

## 2015-01-09 SURGERY — REMOVAL PORT-A-CATH
Anesthesia: Monitor Anesthesia Care

## 2015-01-09 MED ORDER — PROPOFOL 10 MG/ML IV BOLUS
INTRAVENOUS | Status: DC | PRN
  Administered 2015-01-09: 20 mg via INTRAVENOUS
  Administered 2015-01-09: 40 mg via INTRAVENOUS

## 2015-01-09 MED ORDER — PROMETHAZINE HCL 25 MG/ML IJ SOLN: 6.2500 mg | INTRAMUSCULAR | Status: DC | PRN

## 2015-01-09 MED ORDER — MEPERIDINE HCL 25 MG/ML IJ SOLN: 6.2500 mg | INTRAMUSCULAR | Status: DC | PRN

## 2015-01-09 MED ORDER — SODIUM CHLORIDE 0.9 % IV SOLN: 250.0000 mL | INTRAVENOUS | Status: DC | PRN

## 2015-01-09 MED ORDER — SODIUM CHLORIDE 0.9 % IJ SOLN: 3.0000 mL | Freq: Two times a day (BID) | INTRAMUSCULAR | Status: DC

## 2015-01-09 MED ORDER — CEFAZOLIN SODIUM-DEXTROSE 2-3 GM-% IV SOLR
INTRAVENOUS | Status: AC
  Filled 2015-01-09: qty 50

## 2015-01-09 MED ORDER — ACETAMINOPHEN 650 MG RE SUPP: 650.0000 mg | RECTAL | Status: DC | PRN

## 2015-01-09 MED ORDER — OXYCODONE-ACETAMINOPHEN 5-325 MG PO TABS: 1.0000 | ORAL_TABLET | ORAL | Status: DC | PRN

## 2015-01-09 MED ORDER — LACTATED RINGERS IV SOLN
INTRAVENOUS | Status: DC
  Administered 2015-01-09 (×2): via INTRAVENOUS

## 2015-01-09 MED ORDER — ACETAMINOPHEN 325 MG PO TABS: 650.0000 mg | ORAL_TABLET | ORAL | Status: DC | PRN

## 2015-01-09 MED ORDER — SCOPOLAMINE 1 MG/3DAYS TD PT72: 1.0000 | MEDICATED_PATCH | Freq: Once | TRANSDERMAL | Status: DC | PRN

## 2015-01-09 MED ORDER — CEFAZOLIN SODIUM-DEXTROSE 2-3 GM-% IV SOLR
2.0000 g | INTRAVENOUS | Status: AC
  Administered 2015-01-09: 2 g via INTRAVENOUS

## 2015-01-09 MED ORDER — LIDOCAINE-EPINEPHRINE (PF) 1 %-1:200000 IJ SOLN
INTRAMUSCULAR | Status: DC | PRN
  Administered 2015-01-09: 10 mL

## 2015-01-09 MED ORDER — SODIUM CHLORIDE 0.9 % IJ SOLN: 3.0000 mL | INTRAMUSCULAR | Status: DC | PRN

## 2015-01-09 MED ORDER — FENTANYL CITRATE (PF) 100 MCG/2ML IJ SOLN
50.0000 ug | INTRAMUSCULAR | Status: DC | PRN
  Administered 2015-01-09: 50 ug via INTRAVENOUS

## 2015-01-09 MED ORDER — OXYCODONE HCL 5 MG PO TABS: 5.0000 mg | ORAL_TABLET | ORAL | Status: DC | PRN

## 2015-01-09 MED ORDER — LIDOCAINE-EPINEPHRINE (PF) 1 %-1:200000 IJ SOLN: INTRAMUSCULAR | Status: DC | PRN

## 2015-01-09 MED ORDER — FENTANYL CITRATE (PF) 100 MCG/2ML IJ SOLN
INTRAMUSCULAR | Status: AC
  Filled 2015-01-09: qty 2

## 2015-01-09 MED ORDER — FENTANYL CITRATE (PF) 100 MCG/2ML IJ SOLN: 25.0000 ug | INTRAMUSCULAR | Status: DC | PRN

## 2015-01-09 MED ORDER — LIDOCAINE HCL (CARDIAC) 20 MG/ML IV SOLN
INTRAVENOUS | Status: DC | PRN
  Administered 2015-01-09: 50 mg via INTRAVENOUS

## 2015-01-09 MED ORDER — GLYCOPYRROLATE 0.2 MG/ML IJ SOLN: 0.2000 mg | Freq: Once | INTRAMUSCULAR | Status: DC | PRN

## 2015-01-09 MED ORDER — MIDAZOLAM HCL 2 MG/2ML IJ SOLN: 1.0000 mg | INTRAMUSCULAR | Status: DC | PRN

## 2015-01-09 SURGICAL SUPPLY — 36 items
BLADE HEX COATED 2.75 (ELECTRODE) ×3 IMPLANT
BLADE SURG 15 STRL LF DISP TIS (BLADE) ×1 IMPLANT
BLADE SURG 15 STRL SS (BLADE) ×2
CANISTER SUCT 1200ML W/VALVE (MISCELLANEOUS) IMPLANT
CHLORAPREP W/TINT 26ML (MISCELLANEOUS) ×3 IMPLANT
COVER BACK TABLE 60X90IN (DRAPES) ×3 IMPLANT
COVER MAYO STAND STRL (DRAPES) ×3 IMPLANT
DECANTER SPIKE VIAL GLASS SM (MISCELLANEOUS) ×6 IMPLANT
DRAPE LAPAROTOMY 100X72 PEDS (DRAPES) ×3 IMPLANT
DRAPE UTILITY XL STRL (DRAPES) ×3 IMPLANT
ELECT REM PT RETURN 9FT ADLT (ELECTROSURGICAL) ×3
ELECTRODE REM PT RTRN 9FT ADLT (ELECTROSURGICAL) ×1 IMPLANT
GLOVE BIO SURGEON STRL SZ 6 (GLOVE) ×3 IMPLANT
GLOVE BIOGEL PI IND STRL 6.5 (GLOVE) ×1 IMPLANT
GLOVE BIOGEL PI IND STRL 7.0 (GLOVE) ×1 IMPLANT
GLOVE BIOGEL PI INDICATOR 6.5 (GLOVE) ×2
GLOVE BIOGEL PI INDICATOR 7.0 (GLOVE) ×2
GLOVE ECLIPSE 6.5 STRL STRAW (GLOVE) ×3 IMPLANT
GLOVE EXAM NITRILE MD LF STRL (GLOVE) ×3 IMPLANT
GOWN STRL REUS W/ TWL LRG LVL3 (GOWN DISPOSABLE) ×1 IMPLANT
GOWN STRL REUS W/TWL 2XL LVL3 (GOWN DISPOSABLE) ×3 IMPLANT
GOWN STRL REUS W/TWL LRG LVL3 (GOWN DISPOSABLE) ×2
LIQUID BAND (GAUZE/BANDAGES/DRESSINGS) ×3 IMPLANT
NEEDLE HYPO 25X1 1.5 SAFETY (NEEDLE) ×3 IMPLANT
NS IRRIG 1000ML POUR BTL (IV SOLUTION) ×3 IMPLANT
PACK BASIN DAY SURGERY FS (CUSTOM PROCEDURE TRAY) ×3 IMPLANT
PENCIL BUTTON HOLSTER BLD 10FT (ELECTRODE) ×3 IMPLANT
SUT MNCRL AB 4-0 PS2 18 (SUTURE) ×3 IMPLANT
SUT VIC AB 3-0 SH 27 (SUTURE) ×2
SUT VIC AB 3-0 SH 27X BRD (SUTURE) ×1 IMPLANT
SYR CONTROL 10ML LL (SYRINGE) ×3 IMPLANT
TOWEL OR 17X24 6PK STRL BLUE (TOWEL DISPOSABLE) ×3 IMPLANT
TOWEL OR NON WOVEN STRL DISP B (DISPOSABLE) ×3 IMPLANT
TUBE CONNECTING 20'X1/4 (TUBING)
TUBE CONNECTING 20X1/4 (TUBING) IMPLANT
YANKAUER SUCT BULB TIP NO VENT (SUCTIONS) IMPLANT

## 2015-01-09 NOTE — H&P (Signed)
Lynn Morris is an 74 y.o. female.   Chief Complaint: gastric cancer HPI:  Pt is s/p total gastrectomy, chemotherapy, and recovery.  She is done with chemo and desires port removal.    Past Medical History  Diagnosis Date  . Esophageal stricture   . Hyperlipemia   . Arthritis     LG TOE  . Cataract     left eye and immature  . Complication of anesthesia   . PONV (postoperative nausea and vomiting)   . Hiatal hernia 12-13-13    intermittent nausea with vomiting  . History of blood transfusion     no abnormal reaction noted, s/p stomach surgery 9'15([facility])  . Pneumonia     touch of it many yrs ago(>100yrs)  . History of bronchitis > 28yrs ago  . History of migraine     many yrs ago  . Esophageal reflux     was on Prilosec;has been off a yr  . Diverticulosis   . Anxiety     takes Zoloft every other day  . Optic neuritis     many yrs ago  . Cancer 11/24/13    gastroesophageal carcinoma  . Low blood pressure     thought to be related to dehydration LOV 08-01-14([facility]) given IV fluids  . Hypokalemia     tx. with potassium supplement  . Depression   . Headache     hx of    Past Surgical History  Procedure Laterality Date  . Abdominal hysterectomy  1978    Partial   . Cesarean section  1964 & 1966  . Nose surgery  1970  . Appendectomy    . Abdominal hysterectomy    . Eus N/A 12/22/2013    Procedure: UPPER ENDOSCOPIC ULTRASOUND (EUS) LINEAR;  Surgeon: Milus Banister, MD;  [facility]: [facility];  Service: Endoscopy;  Laterality: N/A;  . Portacath placement N/A 12/27/2013    Procedure: INSERTION PORT-A-CATH;  Surgeon: Stark Klein, MD;  Location: [place];  Service: General;  Laterality: N/A;  . Esophagogastroduodenoscopy      with dilitation  . Colonoscopy    . Laparoscopy N/A 04/04/2014    Procedure: DIAGNOSTIC LAPAROSCOPY;  Surgeon: Stark Klein, MD;  Location: [place];  Service: General;  Laterality: N/A;  . Partial gastrectomy N/A 04/04/2014    Procedure: TOTAL GASTRECTOMY;   Surgeon: Stark Klein, MD;  Location: [place];  Service: General;  Laterality: N/A;  . Jejunostomy N/A 04/04/2014    Procedure: ESOPHAGOJEJUNOSTOMY AND FEEDING JEJUNOSTOMY;  Surgeon: Stark Klein, MD;  Location: [place];  Service: General;  Laterality: N/A;/ then removal.-no longer has.  . Esophagogastroduodenoscopy (egd) with propofol N/A 06/26/2014    Procedure: ESOPHAGOGASTRODUODENOSCOPY (EGD) WITH PROPOFOL;  Surgeon: Irene Shipper, MD;  [facility]: [facility];  Service: Endoscopy;  Laterality: N/A;  . Balloon dilation N/A 06/26/2014    Procedure: BALLOON DILATION;  Surgeon: Irene Shipper, MD;  [facility]: [facility];  Service: Endoscopy;  Laterality: N/A;  . Esophagogastroduodenoscopy N/A 08/08/2014    Procedure: ESOPHAGOGASTRODUODENOSCOPY (EGD);  Surgeon: Irene Shipper, MD;  Location: [facility];  Service: Endoscopy;  Laterality: N/A;  . Balloon dilation N/A 08/08/2014    Procedure: BALLOON DILATION;  Surgeon: Irene Shipper, MD;  [facility];  Service: Endoscopy;  Laterality: N/A;  . Esophagogastroduodenoscopy N/A 12/26/2014    Procedure: ESOPHAGOGASTRODUODENOSCOPY (EGD);  Surgeon: Irene Shipper, MD;  Location: [facility];  Service: Endoscopy;  Laterality: N/A;  . Balloon dilation N/A 12/26/2014    Procedure:  BALLOON DILATION;  Surgeon: Irene Shipper, MD;  Location: [facility];  Service: Endoscopy;  Laterality: N/A;    Family History  Problem Relation Age of Onset  . Diabetes Mother   . Hypertension Mother   . Pancreatic cancer Mother   . Colon cancer Neg Hx    Social History:  reports that she has never smoked. She has never used smokeless tobacco. She reports that she does not drink alcohol or use illicit drugs.  Allergies:  Allergies  Allergen Reactions  . Codeine     Unknown- patient does not remember    Medications Prior to Admission  Medication Sig Dispense Refill  . Cholecalciferol (VITAMIN D3) 2000 UNITS TABS Take 1 tablet by mouth daily.     . famotidine (PEPCID) 20 MG  tablet Take 1 tablet by mouth two  times daily 180 tablet 1  . KLOR-CON M20 20 MEQ tablet TAKE 1 TABLET BY MOUTH EVERY DAY (Patient taking differently: No sig reported) 30 tablet 0  . lidocaine-prilocaine (EMLA) cream Apply to port a cath site one hour prior to use. Cover with plastic. Do not rub in. 30 g 11  . sertraline (ZOLOFT) 100 MG tablet Take 100 mg by mouth every other day.     . sucralfate (CARAFATE) 1 GM/10ML suspension TAKE 10 MLS (1 G TOTAL) BY MOUTH 4 (FOUR) TIMES DAILY - WITH MEALS AND AT BEDTIME. 420 mL 12    No results found for this or any previous visit (from the past 48 hour(s)). No results found.  Review of Systems  All other systems reviewed and are negative.   Blood pressure 107/72, pulse 71, temperature 97.7 F (36.5 C), temperature source Oral, resp. rate 18, height 5\' 1"  (1.549 m), weight 39.917 kg (88 lb), SpO2 100 %. Physical Exam  Constitutional: She is oriented to person, place, and time. She appears well-developed and well-nourished. No distress.  HENT:  Head: Normocephalic and atraumatic.  Eyes: Conjunctivae are normal. Pupils are equal, round, and reactive to light. No scleral icterus.  Neck: Normal range of motion.  Cardiovascular: Normal rate.   Respiratory: Effort normal. No respiratory distress.  Port a cath on left chest.    GI: Soft. She exhibits no distension. There is no tenderness.  Musculoskeletal: Normal range of motion.  Neurological: She is alert and oriented to person, place, and time.  Skin: Skin is warm and dry. No rash noted. She is not diaphoretic. No erythema. No pallor.  Psychiatric: She has a normal mood and affect. Her behavior is normal. Judgment and thought content normal.     Assessment/Plan Gastric cancer. Port will be removed.   Risks/benefits discussed.    Annell Canty 01/09/2015, 2:07 PM

## 2015-01-09 NOTE — Op Note (Signed)
  PRE-OPERATIVE DIAGNOSIS:  un-needed left subclavian Port-A-Cath for gastric cancer  POST-OPERATIVE DIAGNOSIS:  Same   PROCEDURE:  Procedure(s):  REMOVAL PORT-A-CATH  SURGEON:  Surgeon(s):  Stark Klein, MD  ANESTHESIA:   MAC + local  EBL:   Minimal  SPECIMEN:  None  Complications : none known  Procedure:   Pt was  identified in the holding area and taken to the operating room where she was placed supine on the operating room table.  General anesthesia was induced via LMA.  The left upper chest was prepped and draped.  The prior incision was anesthetized with local anesthetic.  The incision was opened with a #15 blade.  The subcutaneous tissue was divided with the cautery.  The port was identified and the capsule opened.  The four 2-0 prolene sutures were removed.  The port was then removed and pressure held on the tract.  The catheter appeared intact without evidence of breakage.  The length of the catheter was 23.5 cm.  The wound was inspected for hemostasis, which was achieved with cautery.  The wound was closed with 3-0 vicryl deep dermal interrupted sutures and 4-0 Monocryl running subcuticular suture.  The wound was cleaned, dried, and dressed with dermabond.  The patient was awakened from anesthesia and taken to the PACU in stable condition.  Needle, sponge, and instrument counts are correct.

## 2015-01-09 NOTE — Anesthesia Postprocedure Evaluation (Signed)
  Anesthesia Post-op Note  Patient: Lynn Morris  Procedure(s) Performed: Procedure(s): REMOVAL PORT-A-CATH (N/A)  Patient Location: PACU  Anesthesia Type:MAC  Level of Consciousness: awake and alert   Airway and Oxygen Therapy: Patient Spontanous Breathing  Post-op Pain: none  Post-op Assessment: Post-op Vital signs reviewed              Post-op Vital Signs: Reviewed  Last Vitals:  Filed Vitals:   01/09/15 1530  BP: 99/62  Pulse: 69  Temp:   Resp: 18    Complications: No apparent anesthesia complications

## 2015-01-09 NOTE — Discharge Instructions (Addendum)
[facility] °Office Phone Number [phone] ° ° POST OP INSTRUCTIONS ° °Always review your discharge instruction sheet given to you by the facility where your surgery was performed. ° °IF YOU HAVE DISABILITY OR FAMILY LEAVE FORMS, YOU MUST BRING THEM TO THE OFFICE FOR PROCESSING.  DO NOT GIVE THEM TO YOUR DOCTOR. ° °1. A prescription for pain medication may be given to you upon discharge.  Take your pain medication as prescribed, if needed.  If narcotic pain medicine is not needed, then you may take acetaminophen (Tylenol) or ibuprofen (Advil) as needed. °2. Take your usually prescribed medications unless otherwise directed °3. If you need a refill on your pain medication, please contact your pharmacy.  They will contact our office to request authorization.  Prescriptions will not be filled after 5pm or on week-ends. °4. You should eat very light the first 24 hours after surgery, such as soup, crackers, pudding, etc.  Resume your normal diet the day after surgery °5. It is common to experience some constipation if taking pain medication after surgery.  Increasing fluid intake and taking a stool softener will usually help or prevent this problem from occurring.  A mild laxative (Milk of Magnesia or Miralax) should be taken according to package directions if there are no bowel movements after 48 hours. °6. You may shower in 48 hours.  The surgical glue will flake off in 2-3 weeks.   °7. ACTIVITIES:  No strenuous activity or heavy lifting for 1 week.   °a. You may drive when you no longer are taking prescription pain medication, you can comfortably wear a seatbelt, and you can safely maneuver your car and apply brakes. °b. RETURN TO WORK:  __________n/a_______________ °You should see your doctor in the office for a follow-up appointment approximately three-four weeks after your surgery.   ° °WHEN TO CALL YOUR DOCTOR: °1. Fever over 101.0 °2. Nausea and/or vomiting. °3. Extreme swelling or  bruising. °4. Continued bleeding from incision. °5. Increased pain, redness, or drainage from the incision. ° °The clinic staff is available to answer your questions during regular business hours.  Please don’t hesitate to call and ask to speak to one of the nurses for clinical concerns.  If you have a medical emergency, go to the nearest emergency room or call 911.  A surgeon from [facility] is always on call at the hospital. ° °For further questions, please visit [email or web address]  ° ° °Post Anesthesia Home Care Instructions ° °Activity: °Get plenty of rest for the remainder of the day. A responsible adult should stay with you for 24 hours following the procedure.  °For the next 24 hours, DO NOT: °-Drive a car °-Operate machinery °-Drink alcoholic beverages °-Take any medication unless instructed by your physician °-Make any legal decisions or sign important papers. ° °Meals: °Start with liquid foods such as gelatin or soup. Progress to regular foods as tolerated. Avoid greasy, spicy, heavy foods. If nausea and/or vomiting occur, drink only clear liquids until the nausea and/or vomiting subsides. Call your physician if vomiting continues. ° °Special Instructions/Symptoms: °Your throat may feel dry or sore from the anesthesia or the breathing tube placed in your throat during surgery. If this causes discomfort, gargle with warm salt water. The discomfort should disappear within 24 hours. ° °If you had a scopolamine patch placed behind your ear for the management of post- operative nausea and/or vomiting: ° °1. The medication in the patch is effective for 72 hours, after which it should be   removed.  Wrap patch in a tissue and discard in the trash. Wash hands thoroughly with soap and water. °2. You may remove the patch earlier than 72 hours if you experience unpleasant side effects which may include dry mouth, dizziness or visual disturbances. °3. Avoid touching the patch. Wash your hands with  soap and water after contact with the patch. °  ° °

## 2015-01-09 NOTE — Anesthesia Preprocedure Evaluation (Addendum)
Anesthesia Evaluation  Patient identified by MRN, date of birth, ID band Patient awake    Reviewed: Allergy & Precautions, NPO status , Patient's Chart, lab work & pertinent test results  History of Anesthesia Complications (+) PONV  Airway Mallampati: II  TM Distance: >3 FB Neck ROM: Full    Dental  (+) Teeth Intact, Dental Advisory Given   Pulmonary neg pulmonary ROS,  breath sounds clear to auscultation  Pulmonary exam normal       Cardiovascular negative cardio ROS Normal cardiovascular examRhythm:Regular Rate:Normal     Neuro/Psych PSYCHIATRIC DISORDERS Anxiety Depression negative neurological ROS     GI/Hepatic Neg liver ROS, hiatal hernia, GERD-  ,Gastric cancer and esophageal stricture   Endo/Other  negative endocrine ROS  Renal/GU negative Renal ROS     Musculoskeletal  (+) Arthritis -,   Abdominal   Peds  Hematology negative hematology ROS (+)   Anesthesia Other Findings   Reproductive/Obstetrics                             Anesthesia Physical  Anesthesia Plan  ASA: III  Anesthesia Plan: MAC   Post-op Pain Management:    Induction: Intravenous  Airway Management Planned: Simple Face Mask  Additional Equipment:   Intra-op Plan:   Post-operative Plan:   Informed Consent: I have reviewed the patients History and Physical, chart, labs and discussed the procedure including the risks, benefits and alternatives for the proposed anesthesia with the patient or authorized representative who has indicated his/her understanding and acceptance.   Dental advisory given  Plan Discussed with:   Anesthesia Plan Comments:         Anesthesia Quick Evaluation

## 2015-01-09 NOTE — Transfer of Care (Signed)
Immediate Anesthesia Transfer of Care Note  Patient: Lynn Morris  Procedure(s) Performed: Procedure(s): REMOVAL PORT-A-CATH (N/A)  Patient Location: PACU  Anesthesia Type:MAC  Level of Consciousness: awake, alert  and oriented  Airway & Oxygen Therapy: Patient Spontanous Breathing and Patient connected to face mask oxygen  Post-op Assessment: Report given to RN and Post -op Vital signs reviewed and stable  Post vital signs: Reviewed and stable  Last Vitals:  Filed Vitals:   01/09/15 1129  BP: 107/72  Pulse: 71  Temp: 36.5 C  Resp: 18    Complications: No apparent anesthesia complications

## 2015-01-10 ENCOUNTER — Telehealth: Payer: Self-pay | Admitting: *Deleted

## 2015-01-10 ENCOUNTER — Encounter (HOSPITAL_BASED_OUTPATIENT_CLINIC_OR_DEPARTMENT_OTHER): Payer: Self-pay | Admitting: General Surgery

## 2015-01-10 NOTE — Telephone Encounter (Signed)
Pt called informing nurse that she had port removal yesterday 01/09/15.  Pt would like to have blood drawn peripherally with her next office visit  On  02/06/15.

## 2015-01-29 ENCOUNTER — Other Ambulatory Visit: Payer: Self-pay | Admitting: Family Medicine

## 2015-01-29 DIAGNOSIS — M8580 Other specified disorders of bone density and structure, unspecified site: Secondary | ICD-10-CM

## 2015-02-05 ENCOUNTER — Other Ambulatory Visit: Payer: Self-pay | Admitting: *Deleted

## 2015-02-06 ENCOUNTER — Other Ambulatory Visit: Payer: Medicare Other

## 2015-02-06 ENCOUNTER — Telehealth: Payer: Self-pay | Admitting: Oncology

## 2015-02-06 ENCOUNTER — Ambulatory Visit (HOSPITAL_BASED_OUTPATIENT_CLINIC_OR_DEPARTMENT_OTHER): Payer: Medicare Other | Admitting: Nurse Practitioner

## 2015-02-06 VITALS — BP 138/79 | HR 62 | Temp 97.7°F | Resp 18 | Ht 61.0 in | Wt 90.5 lb

## 2015-02-06 DIAGNOSIS — C169 Malignant neoplasm of stomach, unspecified: Secondary | ICD-10-CM | POA: Diagnosis not present

## 2015-02-06 NOTE — Progress Notes (Addendum)
[facility] OFFICE PROGRESS NOTE   Diagnosis:  Gastroesophageal carcinoma  INTERVAL HISTORY:   Lynn Morris returns as scheduled. She overall feels well. She has a good appetite. No dysphagia. She continues small frequent meals. She continues to note discomfort and firmness at the right lower abdomen for up to 2 hours after eating. This resolves spontaneously. She has frequent "belching and burping". No nausea or vomiting.  Objective:  Vital signs in last 24 hours:  Blood pressure 138/79, pulse 62, temperature 97.7 F (36.5 C), temperature source Oral, resp. rate 18, height 5\' 1"  (1.549 m), weight 90 lb 8 oz (41.051 kg), SpO2 100 %.    HEENT: No thrush or ulcers. Lymphatics: No palpable cervical, supra clavicular or axillary lymph nodes. Shotty bilateral inguinal nodes. Resp: Lungs clear bilaterally. Cardio: Regular rate and rhythm. GI: Abdomen soft and nontender. No hepatomegaly. Vascular: No leg edema.  Well-healed Port-A-Cath scar left chest.    Lab Results:  Lab Results  Component Value Date   WBC 4.5 11/14/2014   HGB 12.3 11/14/2014   HCT 36.5 11/14/2014   MCV 87.5 11/14/2014   PLT 191 11/14/2014   NEUTROABS 2.5 11/14/2014    Imaging:  No results found.  Medications: I have reviewed the patient's current medications.  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   Cycle 1 CAPOX beginning 01/02/2014.   Cycle 2 CAPOX beginning 01/23/2014.   Cycle 3 CAPOX beginning 02/13/2014.   Restaging PET scan 02/27/2014 with mild hypermetabolism in the hilar regions with an SUV max on the right of 4.9. Previously seen mild hypermetabolism associated with the distal esophagus not readily identified. Mild uptake in the stomach without a focal lesion or uptake above blood pool.   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  Cycle 1 adjuvant FOLFOX 05/08/2014  Cycle 2 held 05/29/2014 due to neutropenia.  Cycle 2 adjuvant FOLFOX 06/05/2014 with Neulasta support.  Cycle 3 adjuvant FOLFOX 06/19/2014 with Neulasta support. 2. History of gastroesophageal reflux disease and peptic stricture, status post an esophageal dilatation procedure in August 2012 3. History of solid dysphagia secondary to #1.  4. Anxiety. 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 6. Anorexia. Trial of Remeron initiated 05/08/2014. 7. Nausea and vomiting-persistent following FOLFOX 05/08/2014. Improved since beginning Reglan. 8. Neutropenia secondary to chemotherapy, cycle 2 FOLFOX held 05/29/2014. 9. Status post upper endoscopy 06/26/2014 with findings of severe ulcerative esophagitis; 10 mm anastomotic stricture status post balloon dilatation to 12 mm. 10. Status post upper endoscopy 08/08/2014 with findings of severe ulcerative esophagitis distally; 7 mm anastomotic stricture status post dilatation to 13.5 mm. Status post upper endoscopy 12/26/2014. Improved esophagitis; anastomotic stricture with improved diameter from previous exam.  Status post dilatation. 11. Port-A-Cath removal 01/09/2015.   Disposition: Ms. Borak appears stable. She remains in clinical remission from gastroesophageal carcinoma. She will return for a follow-up visit in 4 months. She will contact the office in the interim with any problems.  Patient seen with Dr. Benay Spice.    Ned Card ANP/GNP-BC   02/06/2015  9:53 AM  This was a shared visit with Ned Card. I do not recommend surveillance CT scans.  Julieanne Manson, M.D.

## 2015-02-06 NOTE — Telephone Encounter (Signed)
Gave patient avs report and appointment for November. °

## 2015-02-14 ENCOUNTER — Ambulatory Visit
Admission: RE | Admit: 2015-02-14 | Discharge: 2015-02-14 | Disposition: A | Discharge status: Home / Self Care | Payer: Medicare Other | Source: Ambulatory Visit | Attending: Family Medicine | Admitting: Family Medicine

## 2015-02-14 DIAGNOSIS — M8580 Other specified disorders of bone density and structure, unspecified site: Secondary | ICD-10-CM

## 2015-02-15 ENCOUNTER — Other Ambulatory Visit: Payer: Medicare Other

## 2015-03-21 ENCOUNTER — Other Ambulatory Visit: Payer: Self-pay

## 2015-03-21 DIAGNOSIS — C169 Malignant neoplasm of stomach, unspecified: Secondary | ICD-10-CM

## 2015-03-22 ENCOUNTER — Other Ambulatory Visit: Payer: Self-pay

## 2015-03-22 DIAGNOSIS — C169 Malignant neoplasm of stomach, unspecified: Secondary | ICD-10-CM

## 2015-03-23 ENCOUNTER — Ambulatory Visit
Admission: RE | Admit: 2015-03-23 | Discharge: 2015-03-23 | Disposition: A | Discharge status: Home / Self Care | Payer: Medicare Other | Source: Ambulatory Visit | Attending: General Surgery | Admitting: General Surgery

## 2015-03-23 ENCOUNTER — Other Ambulatory Visit: Payer: Self-pay | Admitting: General Surgery

## 2015-03-23 ENCOUNTER — Other Ambulatory Visit: Payer: Medicare Other

## 2015-03-23 DIAGNOSIS — C169 Malignant neoplasm of stomach, unspecified: Secondary | ICD-10-CM

## 2015-03-27 ENCOUNTER — Other Ambulatory Visit: Payer: Self-pay | Admitting: General Surgery

## 2015-03-27 DIAGNOSIS — Z85028 Personal history of other malignant neoplasm of stomach: Secondary | ICD-10-CM

## 2015-03-27 NOTE — Progress Notes (Signed)
Quick Note:  Please let patient know that things look open. Please refer back to GI. ______

## 2015-03-27 NOTE — Addendum Note (Signed)
Addended by: Stark Klein on: 03/27/2015 05:51 PM   Modules accepted: Orders

## 2015-03-28 ENCOUNTER — Other Ambulatory Visit: Payer: Self-pay

## 2015-03-28 DIAGNOSIS — Z85028 Personal history of other malignant neoplasm of stomach: Secondary | ICD-10-CM

## 2015-03-29 ENCOUNTER — Other Ambulatory Visit: Payer: Self-pay | Admitting: General Surgery

## 2015-03-29 DIAGNOSIS — Z85028 Personal history of other malignant neoplasm of stomach: Secondary | ICD-10-CM

## 2015-03-30 ENCOUNTER — Ambulatory Visit
Admission: RE | Admit: 2015-03-30 | Discharge: 2015-03-30 | Disposition: A | Discharge status: Home / Self Care | Payer: Medicare Other | Source: Ambulatory Visit | Attending: General Surgery | Admitting: General Surgery

## 2015-03-30 DIAGNOSIS — Z85028 Personal history of other malignant neoplasm of stomach: Secondary | ICD-10-CM

## 2015-03-30 MED ORDER — IOPAMIDOL (ISOVUE-300) INJECTION 61%
100.0000 mL | Freq: Once | INTRAVENOUS | Status: AC | PRN
  Administered 2015-03-30: 100 mL via INTRAVENOUS

## 2015-05-22 ENCOUNTER — Telehealth: Payer: Self-pay | Admitting: Oncology

## 2015-05-22 ENCOUNTER — Telehealth: Payer: Self-pay | Admitting: *Deleted

## 2015-05-22 DIAGNOSIS — C169 Malignant neoplasm of stomach, unspecified: Secondary | ICD-10-CM

## 2015-05-22 NOTE — Telephone Encounter (Signed)
Patient called.  She is due to see Dr.Sherrill on 11/17 but she thinks she needs some IVF before then, like Thursday or Friday of this week.   Spoke with patient.  She is feeling week and not feeling like eating much.  She states she has lost 10# since her last visit in July.  She does not think her urine is concentrated, she states it "looks OK".  Will have her come in tomorrow and be seen by Selena Lesser NP and get labs and assessed for IVF. Spoke with patient and she will come at [facility] tomorrow for lab work and then see Selena Lesser at 9:45am (Dr. Fidela Juneau RN aware)

## 2015-05-22 NOTE — Telephone Encounter (Signed)
Patient left message on vm requesting a f/u with BS (sooner) and fluids due to she is having some problems. Message forwarded to desk nurse. Returned call to patient on cell number left by patient [phone] and also home phone but was not able to reach patient or leave message - home phone busy and cell continues to drop call.

## 2015-05-23 ENCOUNTER — Ambulatory Visit (HOSPITAL_BASED_OUTPATIENT_CLINIC_OR_DEPARTMENT_OTHER): Payer: Medicare Other

## 2015-05-23 ENCOUNTER — Telehealth: Payer: Self-pay | Admitting: Oncology

## 2015-05-23 ENCOUNTER — Ambulatory Visit: Payer: Medicare Other | Admitting: Nutrition

## 2015-05-23 ENCOUNTER — Other Ambulatory Visit: Payer: Self-pay | Admitting: Nurse Practitioner

## 2015-05-23 ENCOUNTER — Encounter: Payer: Self-pay | Admitting: Nurse Practitioner

## 2015-05-23 ENCOUNTER — Ambulatory Visit (HOSPITAL_BASED_OUTPATIENT_CLINIC_OR_DEPARTMENT_OTHER): Payer: Medicare Other | Admitting: Nurse Practitioner

## 2015-05-23 VITALS — BP 102/69 | HR 66 | Temp 97.9°F | Resp 18 | Ht 61.0 in | Wt 77.7 lb

## 2015-05-23 DIAGNOSIS — C169 Malignant neoplasm of stomach, unspecified: Secondary | ICD-10-CM | POA: Diagnosis not present

## 2015-05-23 DIAGNOSIS — R109 Unspecified abdominal pain: Secondary | ICD-10-CM

## 2015-05-23 DIAGNOSIS — E876 Hypokalemia: Secondary | ICD-10-CM

## 2015-05-23 DIAGNOSIS — R634 Abnormal weight loss: Secondary | ICD-10-CM | POA: Insufficient documentation

## 2015-05-23 DIAGNOSIS — E860 Dehydration: Secondary | ICD-10-CM

## 2015-05-23 LAB — CBC WITH DIFFERENTIAL/PLATELET
BASO%: 0.5 % (ref 0.0–2.0)
BASOS ABS: 0 10*3/uL (ref 0.0–0.1)
EOS ABS: 0 10*3/uL (ref 0.0–0.5)
EOS%: 0.9 % (ref 0.0–7.0)
HEMATOCRIT: 39.3 % (ref 34.8–46.6)
HEMOGLOBIN: 13.1 g/dL (ref 11.6–15.9)
LYMPH%: 25 % (ref 14.0–49.7)
MCH: 28.9 pg (ref 25.1–34.0)
MCHC: 33.3 g/dL (ref 31.5–36.0)
MCV: 86.8 fL (ref 79.5–101.0)
MONO#: 0.4 10*3/uL (ref 0.1–0.9)
MONO%: 8.9 % (ref 0.0–14.0)
NEUT#: 2.7 10*3/uL (ref 1.5–6.5)
NEUT%: 64.7 % (ref 38.4–76.8)
Platelets: 247 10*3/uL (ref 145–400)
RBC: 4.53 10*6/uL (ref 3.70–5.45)
RDW: 13.1 % (ref 11.2–14.5)
WBC: 4.1 10*3/uL (ref 3.9–10.3)
lymph#: 1 10*3/uL (ref 0.9–3.3)

## 2015-05-23 LAB — COMPREHENSIVE METABOLIC PANEL (CC13)
ALBUMIN: 3.4 g/dL — AB (ref 3.5–5.0)
ALK PHOS: 33 U/L — AB (ref 40–150)
ALT: 14 U/L (ref 0–55)
AST: 19 U/L (ref 5–34)
Anion Gap: 6 mEq/L (ref 3–11)
BUN: 20.5 mg/dL (ref 7.0–26.0)
CALCIUM: 9.1 mg/dL (ref 8.4–10.4)
CHLORIDE: 109 meq/L (ref 98–109)
CO2: 28 mEq/L (ref 22–29)
Creatinine: 0.8 mg/dL (ref 0.6–1.1)
EGFR: 74 mL/min/{1.73_m2} — AB (ref >90)
Glucose: 76 mg/dl (ref 70–140)
POTASSIUM: 3 meq/L — AB (ref 3.5–5.1)
Sodium: 143 mEq/L (ref 136–145)
Total Bilirubin: 0.68 mg/dL (ref 0.20–1.20)
Total Protein: 5.9 g/dL — ABNORMAL LOW (ref 6.4–8.3)

## 2015-05-23 MED ORDER — POTASSIUM CHLORIDE 20 MEQ/100ML IV SOLN: 20.0000 meq | Freq: Once | INTRAVENOUS | Status: DC

## 2015-05-23 MED ORDER — SODIUM CHLORIDE 0.9 % IV SOLN
Freq: Once | INTRAVENOUS | Status: AC
  Administered 2015-05-23: 11:00:00 via INTRAVENOUS
  Filled 2015-05-23: qty 1000

## 2015-05-23 MED ORDER — POTASSIUM CHLORIDE CRYS ER 20 MEQ PO TBCR
20.0000 meq | EXTENDED_RELEASE_TABLET | Freq: Once | ORAL | Status: AC
  Administered 2015-05-23: 20 meq via ORAL
  Filled 2015-05-23: qty 1

## 2015-05-23 MED ORDER — SODIUM CHLORIDE 0.9 % IV SOLN: INTRAVENOUS | Status: DC

## 2015-05-23 NOTE — Assessment & Plan Note (Signed)
Potassium is down to 3.0.  Most likely, the hypokalemia secondary to poor oral intake and dehydration.  Patient states she already has potassium tablets on; she has not been taking them.  Patient will receive 20 mEq of potassium in her IV today; will also receive potassium 20 mEq orally.  Patient was also encouraged to start taking the potassium 20 mEq orally on a daily basis tomorrow.

## 2015-05-23 NOTE — Progress Notes (Signed)
Pt tolerated IVF with KCL and oral potassium. VSS stable and pt reports feeling better. Printed out AVS for next schedule.

## 2015-05-23 NOTE — Assessment & Plan Note (Signed)
Patient now reports a six-week history of nausea and occasional vomiting; as well as associated generalized abdominal discomfort to the central region of her abdomen when she takes in anything orally.  She states that taking more the wonder 2 sips of water makes her stomach hurt.  Due to the abdominal discomfort and associated nausea/vomiting-patient has had minimal oral intake and continues to lose weight.  She is lost another 13 pounds since her last weight check.  She feels dehydrated today.   Patient will receive IV fluid rehydration while the cancer Center today.  Also, patient will be scheduled to return to the [facility] this coming Friday, 05/25/2015 for a joint appointment with both Dr. Benay Spice and Dr. Barry Dienes for further evaluation.  We will also schedule patient for additional IV fluid rehydration on the same day.

## 2015-05-23 NOTE — Progress Notes (Signed)
Brief follow-up completed with patient and her husband during IV fluids. Patient unable to consume foods in adequate amounts. She has had gradual weight loss and current weight is 77 pounds reflects recent 13 pound weight loss. Patient reports she is trying to try to force herself to drink oral nutrition supplements because she does not want to have a feeding tube. I provided support and encouragement for patient and her husband. Have provided additional samples of oral nutrition supplements and recommended patient consume 3-4 daily. Will continue to follow patient in work to improve severe malnutrition.  **Disclaimer: This note was dictated with voice recognition software. Similar sounding words can inadvertently be transcribed and this note may contain transcription errors which may not have been corrected upon publication of note.**

## 2015-05-23 NOTE — Progress Notes (Signed)
SYMPTOM MANAGEMENT CLINIC   HPI: Lynn Morris 74 y.o. female diagnosed with stomach cancer.  Patient is status post chemotherapy completed November 2015.  Patient is currently undergoing observation only.   Patient underwent abdominal surgery per Dr. Shade Flood secondary to her diagnosis of stomach cancer in September 2015.  Since that time.-Patient has had a firm nodule to the center of her abdominal wall.  Patient states that Dr. Barry Dienes has explained that this is most likely either a chronic suture to the abdominal wall or scar tissue.  He has never been a problem for the patient.    Patient now reports a six-week history of nausea and occasional vomiting; as well as associated generalized abdominal discomfort to the central region of her abdomen when she takes in anything orally.  She states that taking more the wonder 2 sips of water makes her stomach hurt.  Due to the abdominal discomfort and associated nausea/vomiting-patient has had minimal oral intake and continues to lose weight.  She is lost another 13 pounds since her last weight check.  She feels dehydrated today.  HPI  ROS  Past Medical History  Diagnosis Date  . Esophageal stricture   . Hyperlipemia   . Arthritis     LG TOE  . Cataract     left eye and immature  . Complication of anesthesia   . PONV (postoperative nausea and vomiting)   . Hiatal hernia 12-13-13    intermittent nausea with vomiting  . History of blood transfusion     no abnormal reaction noted, s/p stomach surgery 9'15([facility])  . Pneumonia     touch of it many yrs ago(>29yr)  . History of bronchitis > 89yrago  . History of migraine     many yrs ago  . Esophageal reflux     was on Prilosec;has been off a yr  . Diverticulosis   . Anxiety     takes Zoloft every other day  . Optic neuritis     many yrs ago  . [facility]    gastroesophageal carcinoma  . Low blood pressure     thought to be related to dehydration LOV 08-01-14([facility]) given IV  fluids  . Hypokalemia     tx. with potassium supplement  . Depression   . Headache     hx of    Past Surgical History  Procedure Laterality Date  . Abdominal hysterectomy  1978    Partial   . Cesarean section  1964 & 1966  . Nose surgery  1970  . Appendectomy    . Abdominal hysterectomy    . Eus N/A 12/22/2013    Procedure: UPPER ENDOSCOPIC ULTRASOUND (EUS) LINEAR;  Surgeon: DaMilus BanisterMD;  Location: [facility];  Service: Endoscopy;  Laterality: N/A;  . Portacath placement N/A 12/27/2013    Procedure: INSERTION PORT-A-CATH;  Surgeon: FaStark KleinMD;  [facility] Service: General;  Laterality: N/A;  . Esophagogastroduodenoscopy      with dilitation  . Colonoscopy    . Laparoscopy N/A 04/04/2014    Procedure: DIAGNOSTIC LAPAROSCOPY;  Surgeon: FaStark KleinMD;  [facility] Service: General;  Laterality: N/A;  . Partial gastrectomy N/A 04/04/2014    Procedure: TOTAL GASTRECTOMY;  Surgeon: FaStark KleinMD;  [facility] Service: General;  Laterality: N/A;  . Jejunostomy N/A 04/04/2014    Procedure: ESOPHAGOJEJUNOSTOMY AND FEEDING JEJUNOSTOMY;  Surgeon: FaStark KleinMD;  [facility] Service: General;  Laterality:  N/A;/ then removal.-no longer has.  . Esophagogastroduodenoscopy (egd) with propofol N/A 06/26/2014    Procedure: ESOPHAGOGASTRODUODENOSCOPY (EGD) WITH PROPOFOL;  Surgeon: Irene Shipper, MD;  Location: [facility];  Service: Endoscopy;  Laterality: N/A;  . Balloon dilation N/A 06/26/2014    Procedure: BALLOON DILATION;  Surgeon: Irene Shipper, MD;  Location: [facility];  Service: Endoscopy;  Laterality: N/A;  . Esophagogastroduodenoscopy N/A 08/08/2014    Procedure: ESOPHAGOGASTRODUODENOSCOPY (EGD);  Surgeon: Irene Shipper, MD;  Location: [facility];  Service: Endoscopy;  Laterality: N/A;  . Balloon dilation N/A 08/08/2014    Procedure: BALLOON DILATION;  Surgeon: Irene Shipper, MD;  Location: [facility];  Service: Endoscopy;  Laterality: N/A;  .  Esophagogastroduodenoscopy N/A 12/26/2014    Procedure: ESOPHAGOGASTRODUODENOSCOPY (EGD);  Surgeon: Irene Shipper, MD;  Location: [facility];  Service: Endoscopy;  Laterality: N/A;  . Balloon dilation N/A 12/26/2014    Procedure: BALLOON DILATION;  Surgeon: Irene Shipper, MD;  Location: [facility];  Service: Endoscopy;  Laterality: N/A;  . 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    is allergic to codeine.    Medication List       This list is accurate as of: 05/23/15 12:21 PM.  Always use your most recent med list.               famotidine 20 MG tablet  Commonly known as:  PEPCID  Take 1 tablet by mouth two  times daily     KLOR-CON M20 20 MEQ tablet  Generic drug:  potassium chloride SA  TAKE 1 TABLET BY MOUTH EVERY DAY     sertraline 100 MG tablet  Commonly known as:  ZOLOFT  Take 100 mg by mouth every other day.     sucralfate 1 GM/10ML suspension  Commonly known as:  CARAFATE  TAKE 10 MLS (1 G TOTAL) BY MOUTH 4 (FOUR) TIMES DAILY - WITH MEALS AND AT BEDTIME.     Vitamin D3 2000 UNITS Tabs  Take 1 tablet by mouth daily.         PHYSICAL EXAMINATION  Oncology Vitals 05/23/2015 02/06/2015 01/09/2015 01/09/2015 01/09/2015 01/09/2015 01/09/2015  Height 155 cm 155 cm - - - - 155 cm  Weight 35.244 kg 41.051 kg - - - - 39.917 kg  Weight (lbs) 77 lbs 11 oz 90 lbs 8 oz - - - - 88 lbs  BMI (kg/m2) 14.68 kg/m2 17.1 kg/m2 - -  - - 16.63 kg/m2  Temp 97.9 97.7 98.1 - - 98 97.7  Pulse 66 62 71 69 76 81 71  Resp 18 18 16 18 21 16 18   SpO2 99 100 100 100 100 100 100  BSA (m2) 1.23 m2 1.33 m2 - - - - 1.31 m2   BP Readings from Last 3 Encounters:  05/23/15 102/69  02/06/15 138/79  01/09/15 102/68    Physical Exam  Constitutional: She is oriented to person, place, and time. Vital signs are normal. She appears malnourished and dehydrated. She appears cachectic.  Patient appears fatigued, slightly weak, and extremely  thin.  HENT:  Head: Normocephalic and atraumatic.  Mouth/Throat: Oropharynx is clear and moist.  Eyes: Conjunctivae and EOM are normal. Pupils are equal, round, and reactive to light. Right eye exhibits no discharge. Left eye exhibits no discharge. No scleral icterus.  Neck: Normal range of motion. Neck supple. No JVD present. No tracheal deviation present. No thyromegaly present.  Cardiovascular: Normal rate, regular rhythm, normal heart sounds and intact distal pulses.   Pulmonary/Chest: Effort normal and breath sounds normal. No respiratory distress. She has no wheezes. She has no rales. She exhibits no tenderness.  Abdominal: Soft. Bowel sounds are normal. She exhibits mass. She exhibits no distension. There is no tenderness. There is no rebound and no guarding.  Patient has a firm nodule to the central abdomen that is chronic for her.  This area is nontender.  Musculoskeletal: Normal range of motion. She exhibits no edema or tenderness.  Lymphadenopathy:    She has no cervical adenopathy.  Neurological: She is alert and oriented to person, place, and time. Gait normal.  Skin: Skin is warm and dry. No rash noted. No erythema. No pallor.  Psychiatric: Affect normal.    LABORATORY DATA:. Appointment on 05/23/2015  Component Date Value Ref Range Status  . WBC 05/23/2015 4.1  3.9 - 10.3 10e3/uL Final  . NEUT# 05/23/2015 2.7  1.5 - 6.5 10e3/uL Final  . HGB 05/23/2015 13.1  11.6 - 15.9 g/dL Final  .  HCT 05/23/2015 39.3  34.8 - 46.6 % Final  . Platelets 05/23/2015 247  145 - 400 10e3/uL Final  . MCV 05/23/2015 86.8  79.5 - 101.0 fL Final  . MCH 05/23/2015 28.9  25.1 - 34.0 pg Final  . MCHC 05/23/2015 33.3  31.5 - 36.0 g/dL Final  . RBC 05/23/2015 4.53  3.70 - 5.45 10e6/uL Final  . RDW 05/23/2015 13.1  11.2 - 14.5 % Final  . lymph# 05/23/2015 1.0  0.9 - 3.3 10e3/uL Final  . MONO# 05/23/2015 0.4  0.1 - 0.9 10e3/uL Final  . Eosinophils Absolute 05/23/2015 0.0  0.0 - 0.5 10e3/uL Final  . Basophils Absolute 05/23/2015 0.0  0.0 - 0.1 10e3/uL Final  . NEUT% 05/23/2015 64.7  38.4 - 76.8 % Final  . LYMPH% 05/23/2015 25.0  14.0 - 49.7 % Final  . MONO% 05/23/2015 8.9  0.0 - 14.0 % Final  . EOS% 05/23/2015 0.9  0.0 - 7.0 % Final  . BASO% 05/23/2015 0.5  0.0 - 2.0 % Final  . Sodium 05/23/2015 143  136 - 145 mEq/L Final  . Potassium 05/23/2015 3.0* 3.5 - 5.1 mEq/L Final  . Chloride 05/23/2015 109  98 - 109 mEq/L Final  . CO2 05/23/2015 28  22 - 29 mEq/L Final  . Glucose 05/23/2015 76  70 - 140 mg/dl Final   Glucose reference range is for nonfasting patients. Fasting glucose reference range is 70- 100.  Marland Kitchen BUN 05/23/2015 20.5  7.0 - 26.0 mg/dL Final  . Creatinine 05/23/2015 0.8  0.6 - 1.1 mg/dL Final  . Total Bilirubin 05/23/2015 0.68  0.20 - 1.20 mg/dL Final  . Alkaline Phosphatase 05/23/2015 33* 40 - 150 U/L Final  . AST 05/23/2015 19  5 - 34 U/L Final  . ALT 05/23/2015 14  0 - 55 U/L Final  . Total Protein 05/23/2015 5.9* 6.4 - 8.3 g/dL Final  . Albumin 05/23/2015 3.4* 3.5 - 5.0 g/dL Final  . Calcium 05/23/2015 9.1  8.4 - 10.4 mg/dL Final  . Anion Gap 05/23/2015 6  3 - 11 mEq/L Final  . EGFR 05/23/2015 74* >90 ml/min/1.73 m2 Final   eGFR is calculated using the CKD-EPI Creatinine Equation (2009)     RADIOGRAPHIC STUDIES: No results found.  ASSESSMENT/PLAN:    Abdominal pain Patient underwent abdominal surgery per Dr. Shade Flood secondary to her diagnosis of stomach cancer in September  2015.  Since that time.-Patient has had a firm nodule to the center of her abdominal wall.  Patient states that Dr. Barry Dienes has explained that this is most likely either a chronic suture to the abdominal wall or scar tissue.  He has never been a problem for the patient.    Patient now reports a six-week history of nausea and occasional vomiting; as well as associated generalized abdominal discomfort to the central region of her abdomen when she takes in anything orally.  She states that taking more the wonder 2 sips of water makes her stomach hurt.  Due to the abdominal discomfort and associated nausea/vomiting-patient has had minimal oral intake and continues to lose weight.  She is lost another 13 pounds since her last weight check.  She feels dehydrated today.  On exam.-Abdomen is soft and essentially nontender with palpation.  Bowel sounds are positive in all 4 quads.  There is no rebound tenderness.  There is also no flank pain.  Blood counts obtained today revealed WBC of 4.1, ANC 2.7, hemoglobin 13.1, platelet count 247.  Patient will receive IV fluid rehydration while the cancer Center today.  Also, patient will be scheduled to return to the [facility] this coming Friday, 05/25/2015 for a joint appointment with both Dr. Benay Spice and Dr. Barry Dienes for further evaluation.  We will also schedule patient for additional IV fluid rehydration on the same day.  Dehydration  Patient now reports a six-week history of nausea and occasional vomiting; as well as associated generalized abdominal discomfort to the central region of her abdomen when she takes in anything orally.  She states that taking more the wonder 2 sips of water makes her stomach hurt.  Due to the abdominal discomfort and associated nausea/vomiting-patient has had minimal oral intake and continues to lose weight.  She is lost another 13 pounds since her last weight check.  She feels dehydrated today.   Patient will receive IV fluid  rehydration while the cancer Center today.  Also, patient will be scheduled to return to the [facility] this coming Friday, 05/25/2015 for a joint appointment with both Dr. Benay Spice and Dr. Barry Dienes for further evaluation.  We will also schedule patient for additional IV fluid rehydration on the same day.  Gastric cancer Patient received her last chemotherapy on 06/19/2014.  She remains in clinical remission.  Patient last saw her surgeon Dr. Barry Dienes in September 2016.  She also underwent a upper and lower GI series study in September 2016 as well.  At that time.-There were no acute findings per patient.  Patient is now complaining of an approximately 6 week history of significant abdominal discomfort and associated nausea/vomiting with any oral intake whatsoever.  Patient has plans to return to the [facility] this coming Friday, 05/25/2015 for additional IV fluid rehydration and for a joint appointment with both Dr. Benay Spice and Dr. Barry Dienes.  Hypokalemia Potassium is down to 3.0.  Most likely, the hypokalemia secondary to poor oral intake and dehydration.  Patient states she already has potassium tablets on; she has not been taking them.  Patient will receive 20 mEq of potassium in her IV today; will also  receive potassium 20 mEq orally.  Patient was also encouraged to start taking the potassium 20 mEq orally on a daily basis tomorrow.  Weight loss Patient has lost approximately 13 pounds since her last weight check.  Patient states that she develops abdominal discomfort and associated nausea/vomiting with any oral intake whatsoever.  Patient was advised to try eating bland soft foods to see if that helps.  She was also encouraged to push fluids on is much as possible.  She met with our [facility] nutritionist today as well.  Patient stated understanding of all instructions; and was in agreement with this plan of care. The patient knows to call the clinic with any problems, questions  or concerns.   This was a shared visit with Dr. Benay Spice today.  Total time spent with patient was 25 minutes;  with greater than 75 percent of that time spent in face to face counseling regarding patient's symptoms,  and coordination of care and follow up.  Disclaimer:This dictation was prepared with Dragon/digital dictation along with [employer]. Any transcriptional errors that result from this process are unintentional.  Drue Second, NP 05/23/2015   This was a shared visit with Drue Second. Mrs. Duba was interviewed and examined. The etiology of her symptoms is unclear. She has undergone an extensive radiological workup over the past few months. She may have adhesions or carcinomatosis. We will try to schedule her to see Dr. Barry Dienes at the multidisciplinary clinic this week.  Julieanne Manson, M.D.

## 2015-05-23 NOTE — Assessment & Plan Note (Signed)
Patient received her last chemotherapy on 06/19/2014.  She remains in clinical remission.  Patient last saw her surgeon Dr. Barry Dienes in September 2016.  She also underwent a upper and lower GI series study in September 2016 as well.  At that time.-There were no acute findings per patient.  Patient is now complaining of an approximately 6 week history of significant abdominal discomfort and associated nausea/vomiting with any oral intake whatsoever.  Patient has plans to return to the [facility] this coming Friday, 05/25/2015 for additional IV fluid rehydration and for a joint appointment with both Dr. Benay Spice and Dr. Barry Dienes.

## 2015-05-23 NOTE — Patient Instructions (Signed)
Dehydration, Adult Dehydration is a condition in which you do not have enough fluid or water in your body. It happens when you take in less fluid than you lose. Vital organs such as the kidneys, brain, and heart cannot function without a proper amount of fluids. Any loss of fluids from the body can cause dehydration.  Dehydration can range from mild to severe. This condition should be treated right away to help prevent it from becoming severe. CAUSES  This condition may be caused by:  Vomiting.  Diarrhea.  Excessive sweating, such as when exercising in hot or humid weather.  Not drinking enough fluid during strenuous exercise or during an illness.  Excessive urine output.  Fever.  Certain medicines. RISK FACTORS This condition is more likely to develop in:  People who are taking certain medicines that cause the body to lose excess fluid (diuretics).   People who have a chronic illness, such as diabetes, that may increase urination.  Older adults.   People who live at high altitudes.   People who participate in endurance sports.  SYMPTOMS  Mild Dehydration  Thirst.  Dry lips.  Slightly dry mouth.  Dry, warm skin. Moderate Dehydration  Very dry mouth.   Muscle cramps.   Dark urine and decreased urine production.   Decreased tear production.   Headache.   Light-headedness, especially when you stand up from a sitting position.  Severe Dehydration  Changes in skin.   Cold and clammy skin.   Skin does not spring back quickly when lightly pinched and released.   Changes in body fluids.   Extreme thirst.   No tears.   Not able to sweat when body temperature is high, such as in hot weather.   Minimal urine production.   Changes in vital signs.   Rapid, weak pulse (more than 100 beats per minute when you are sitting still).   Rapid breathing.   Low blood pressure.   Other changes.   Sunken eyes.   Cold hands and feet.    Confusion.  Lethargy and difficulty being awakened.  Fainting (syncope).   Short-term weight loss.   Unconsciousness. DIAGNOSIS  This condition may be diagnosed based on your symptoms. You may also have tests to determine how severe your dehydration is. These tests may include:   Urine tests.   Blood tests.  TREATMENT  Treatment for this condition depends on the severity. Mild or moderate dehydration can often be treated at home. Treatment should be started right away. Do not wait until dehydration becomes severe. Severe dehydration needs to be treated at the hospital. Treatment for Mild Dehydration  Drinking plenty of water to replace the fluid you have lost.   Replacing minerals in your blood (electrolytes) that you may have lost.  Treatment for Moderate Dehydration  Consuming oral rehydration solution (ORS). Treatment for Severe Dehydration  Receiving fluid through an IV tube.   Receiving electrolyte solution through a feeding tube that is passed through your nose and into your stomach (nasogastric tube or NG tube).  Correcting any abnormalities in electrolytes. HOME CARE INSTRUCTIONS   Drink enough fluid to keep your urine clear or pale yellow.   Drink water or fluid slowly by taking small sips. You can also try sucking on ice cubes.  Have food or beverages that contain electrolytes. Examples include bananas and sports drinks.  Take over-the-counter and prescription medicines only as told by your health care provider.   Prepare ORS according to the manufacturer's instructions. Take sips   of ORS every 5 minutes until your urine returns to normal.  If you have vomiting or diarrhea, continue to try to drink water, ORS, or both.   If you have diarrhea, avoid:   Beverages that contain caffeine.   Fruit juice.   Milk.   Carbonated soft drinks.  Do not take salt tablets. This can lead to the condition of having too much sodium in your body  (hypernatremia).  SEEK MEDICAL CARE IF:  You cannot eat or drink without vomiting.  You have had moderate diarrhea during a period of more than 24 hours.  You have a fever. SEEK IMMEDIATE MEDICAL CARE IF:   You have extreme thirst.  You have severe diarrhea.  You have not urinated in 6-8 hours, or you have urinated only a small amount of very dark urine.  You have shriveled skin.  You are dizzy, confused, or both.   This information is not intended to replace advice given to you by your health care provider. Make sure you discuss any questions you have with your health care provider.   Document Released: 07/07/2005 Document Revised: 03/28/2015 Document Reviewed: 11/22/2014 Elsevier Interactive Patient Education 2016 [employer]   Hypokalemia Hypokalemia means that the amount of potassium in the blood is lower than normal.Potassium is a chemical, called an electrolyte, that helps regulate the amount of fluid in the body. It also stimulates muscle contraction and helps nerves function properly.Most of the body's potassium is inside of cells, and only a very small amount is in the blood. Because the amount in the blood is so small, minor changes can be life-threatening. CAUSES  Antibiotics.  Diarrhea or vomiting.  Using laxatives too much, which can cause diarrhea.  Chronic kidney disease.  Water pills (diuretics).  Eating disorders (bulimia).  Low magnesium level.  Sweating a lot. SIGNS AND SYMPTOMS  Weakness.  Constipation.  Fatigue.  Muscle cramps.  Mental confusion.  Skipped heartbeats or irregular heartbeat (palpitations).  Tingling or numbness. DIAGNOSIS  Your health care provider can diagnose hypokalemia with blood tests. In addition to checking your potassium level, your health care provider may also check other lab tests. TREATMENT Hypokalemia can be treated with potassium supplements taken by mouth or adjustments in your current medicines.  If your potassium level is very low, you may need to get potassium through a vein (IV) and be monitored in the hospital. A diet high in potassium is also helpful. Foods high in potassium are:  Nuts, such as peanuts and pistachios.  Seeds, such as sunflower seeds and pumpkin seeds.  Peas, lentils, and lima beans.  Whole grain and bran cereals and breads.  Fresh fruit and vegetables, such as apricots, avocado, bananas, cantaloupe, kiwi, oranges, tomatoes, asparagus, and potatoes.  Orange and tomato juices.  Red meats.  Fruit yogurt. HOME CARE INSTRUCTIONS  Take all medicines as prescribed by your health care provider.  Maintain a healthy diet by including nutritious food, such as fruits, vegetables, nuts, whole grains, and lean meats.  If you are taking a laxative, be sure to follow the directions on the label. SEEK MEDICAL CARE IF:  Your weakness gets worse.  You feel your heart pounding or racing.  You are vomiting or having diarrhea.  You are diabetic and having trouble keeping your blood glucose in the normal range. SEEK IMMEDIATE MEDICAL CARE IF:  You have chest pain, shortness of breath, or dizziness.  You are vomiting or having diarrhea for more than 2 days.  You faint. MAKE   SURE YOU:   Understand these instructions.  Will watch your condition.  Will get help right away if you are not doing well or get worse.   This information is not intended to replace advice given to you by your health care provider. Make sure you discuss any questions you have with your health care provider.   Document Released: 07/07/2005 Document Revised: 07/28/2014 Document Reviewed: 01/07/2013 Elsevier Interactive Patient Education 2016 Elsevier Inc.  

## 2015-05-23 NOTE — Assessment & Plan Note (Signed)
Patient has lost approximately 13 pounds since her last weight check.  Patient states that she develops abdominal discomfort and associated nausea/vomiting with any oral intake whatsoever.  Patient was advised to try eating bland soft foods to see if that helps.  She was also encouraged to push fluids on is much as possible.  She met with our [place] nutritionist today as well.

## 2015-05-23 NOTE — Assessment & Plan Note (Signed)
Patient underwent abdominal surgery per Dr. Shade Flood secondary to her diagnosis of stomach cancer in September 2015.  Since that time.-Patient has had a firm nodule to the center of her abdominal wall.  Patient states that Dr. Barry Dienes has explained that this is most likely either a chronic suture to the abdominal wall or scar tissue.  He has never been a problem for the patient.    Patient now reports a six-week history of nausea and occasional vomiting; as well as associated generalized abdominal discomfort to the central region of her abdomen when she takes in anything orally.  She states that taking more the wonder 2 sips of water makes her stomach hurt.  Due to the abdominal discomfort and associated nausea/vomiting-patient has had minimal oral intake and continues to lose weight.  She is lost another 13 pounds since her last weight check.  She feels dehydrated today.  On exam.-Abdomen is soft and essentially nontender with palpation.  Bowel sounds are positive in all 4 quads.  There is no rebound tenderness.  There is also no flank pain.  Blood counts obtained today revealed WBC of 4.1, ANC 2.7, hemoglobin 13.1, platelet count 247.  Patient will receive IV fluid rehydration while the cancer Center today.  Also, patient will be scheduled to return to the [facility] this coming Friday, 05/25/2015 for a joint appointment with both Dr. Benay Spice and Dr. Barry Dienes for further evaluation.  We will also schedule patient for additional IV fluid rehydration on the same day.

## 2015-05-23 NOTE — Telephone Encounter (Signed)
Patient came to scheduling today and stated she spoke with a nurse yesterday and was told to come in for labs - fluids and to see CB this morning. No pof sent. Per note from triage nurse yesterday re patient coming in today I added appointments and contacted desk nurse for CB. Desk/nurse CB will work out fluids if needed.

## 2015-05-25 ENCOUNTER — Other Ambulatory Visit: Payer: Self-pay | Admitting: General Surgery

## 2015-05-25 ENCOUNTER — Ambulatory Visit (HOSPITAL_BASED_OUTPATIENT_CLINIC_OR_DEPARTMENT_OTHER): Payer: Medicare Other

## 2015-05-25 VITALS — BP 117/94 | HR 65 | Temp 97.0°F | Resp 18

## 2015-05-25 DIAGNOSIS — E876 Hypokalemia: Secondary | ICD-10-CM | POA: Diagnosis not present

## 2015-05-25 DIAGNOSIS — E860 Dehydration: Secondary | ICD-10-CM

## 2015-05-25 DIAGNOSIS — C160 Malignant neoplasm of cardia: Secondary | ICD-10-CM

## 2015-05-25 MED ORDER — SODIUM CHLORIDE 0.9 % IV SOLN
Freq: Once | INTRAVENOUS | Status: AC
  Administered 2015-05-25: 09:00:00 via INTRAVENOUS
  Filled 2015-05-25: qty 1000

## 2015-05-25 MED ORDER — SODIUM CHLORIDE 0.9 % IV SOLN: Freq: Once | INTRAVENOUS | Status: DC

## 2015-05-25 NOTE — Patient Instructions (Signed)
Dehydration, Adult Dehydration is a condition in which you do not have enough fluid or water in your body. It happens when you take in less fluid than you lose. Vital organs such as the kidneys, brain, and heart cannot function without a proper amount of fluids. Any loss of fluids from the body can cause dehydration.  Dehydration can range from mild to severe. This condition should be treated right away to help prevent it from becoming severe. CAUSES  This condition may be caused by:  Vomiting.  Diarrhea.  Excessive sweating, such as when exercising in hot or humid weather.  Not drinking enough fluid during strenuous exercise or during an illness.  Excessive urine output.  Fever.  Certain medicines. RISK FACTORS This condition is more likely to develop in:  People who are taking certain medicines that cause the body to lose excess fluid (diuretics).   People who have a chronic illness, such as diabetes, that may increase urination.  Older adults.   People who live at high altitudes.   People who participate in endurance sports.  SYMPTOMS  Mild Dehydration  Thirst.  Dry lips.  Slightly dry mouth.  Dry, warm skin. Moderate Dehydration  Very dry mouth.   Muscle cramps.   Dark urine and decreased urine production.   Decreased tear production.   Headache.   Light-headedness, especially when you stand up from a sitting position.  Severe Dehydration  Changes in skin.   Cold and clammy skin.   Skin does not spring back quickly when lightly pinched and released.   Changes in body fluids.   Extreme thirst.   No tears.   Not able to sweat when body temperature is high, such as in hot weather.   Minimal urine production.   Changes in vital signs.   Rapid, weak pulse (more than 100 beats per minute when you are sitting still).   Rapid breathing.   Low blood pressure.   Other changes.   Sunken eyes.   Cold hands and feet.    Confusion.  Lethargy and difficulty being awakened.  Fainting (syncope).   Short-term weight loss.   Unconsciousness. DIAGNOSIS  This condition may be diagnosed based on your symptoms. You may also have tests to determine how severe your dehydration is. These tests may include:   Urine tests.   Blood tests.  TREATMENT  Treatment for this condition depends on the severity. Mild or moderate dehydration can often be treated at home. Treatment should be started right away. Do not wait until dehydration becomes severe. Severe dehydration needs to be treated at the hospital. Treatment for Mild Dehydration  Drinking plenty of water to replace the fluid you have lost.   Replacing minerals in your blood (electrolytes) that you may have lost.  Treatment for Moderate Dehydration  Consuming oral rehydration solution (ORS). Treatment for Severe Dehydration  Receiving fluid through an IV tube.   Receiving electrolyte solution through a feeding tube that is passed through your nose and into your stomach (nasogastric tube or NG tube).  Correcting any abnormalities in electrolytes. HOME CARE INSTRUCTIONS   Drink enough fluid to keep your urine clear or pale yellow.   Drink water or fluid slowly by taking small sips. You can also try sucking on ice cubes.  Have food or beverages that contain electrolytes. Examples include bananas and sports drinks.  Take over-the-counter and prescription medicines only as told by your health care provider.   Prepare ORS according to the manufacturer's instructions. Take sips   of ORS every 5 minutes until your urine returns to normal.  If you have vomiting or diarrhea, continue to try to drink water, ORS, or both.   If you have diarrhea, avoid:   Beverages that contain caffeine.   Fruit juice.   Milk.   Carbonated soft drinks.  Do not take salt tablets. This can lead to the condition of having too much sodium in your body  (hypernatremia).  SEEK MEDICAL CARE IF:  You cannot eat or drink without vomiting.  You have had moderate diarrhea during a period of more than 24 hours.  You have a fever. SEEK IMMEDIATE MEDICAL CARE IF:   You have extreme thirst.  You have severe diarrhea.  You have not urinated in 6-8 hours, or you have urinated only a small amount of very dark urine.  You have shriveled skin.  You are dizzy, confused, or both.   This information is not intended to replace advice given to you by your health care provider. Make sure you discuss any questions you have with your health care provider.   Document Released: 07/07/2005 Document Revised: 03/28/2015 Document Reviewed: 11/22/2014 Elsevier Interactive Patient Education 2016 [employer]   Hypokalemia Hypokalemia means that the amount of potassium in the blood is lower than normal.Potassium is a chemical, called an electrolyte, that helps regulate the amount of fluid in the body. It also stimulates muscle contraction and helps nerves function properly.Most of the body's potassium is inside of cells, and only a very small amount is in the blood. Because the amount in the blood is so small, minor changes can be life-threatening. CAUSES  Antibiotics.  Diarrhea or vomiting.  Using laxatives too much, which can cause diarrhea.  Chronic kidney disease.  Water pills (diuretics).  Eating disorders (bulimia).  Low magnesium level.  Sweating a lot. SIGNS AND SYMPTOMS  Weakness.  Constipation.  Fatigue.  Muscle cramps.  Mental confusion.  Skipped heartbeats or irregular heartbeat (palpitations).  Tingling or numbness. DIAGNOSIS  Your health care provider can diagnose hypokalemia with blood tests. In addition to checking your potassium level, your health care provider may also check other lab tests. TREATMENT Hypokalemia can be treated with potassium supplements taken by mouth or adjustments in your current medicines.  If your potassium level is very low, you may need to get potassium through a vein (IV) and be monitored in the hospital. A diet high in potassium is also helpful. Foods high in potassium are:  Nuts, such as peanuts and pistachios.  Seeds, such as sunflower seeds and pumpkin seeds.  Peas, lentils, and lima beans.  Whole grain and bran cereals and breads.  Fresh fruit and vegetables, such as apricots, avocado, bananas, cantaloupe, kiwi, oranges, tomatoes, asparagus, and potatoes.  Orange and tomato juices.  Red meats.  Fruit yogurt. HOME CARE INSTRUCTIONS  Take all medicines as prescribed by your health care provider.  Maintain a healthy diet by including nutritious food, such as fruits, vegetables, nuts, whole grains, and lean meats.  If you are taking a laxative, be sure to follow the directions on the label. SEEK MEDICAL CARE IF:  Your weakness gets worse.  You feel your heart pounding or racing.  You are vomiting or having diarrhea.  You are diabetic and having trouble keeping your blood glucose in the normal range. SEEK IMMEDIATE MEDICAL CARE IF:  You have chest pain, shortness of breath, or dizziness.  You are vomiting or having diarrhea for more than 2 days.  You faint. MAKE   SURE YOU:   Understand these instructions.  Will watch your condition.  Will get help right away if you are not doing well or get worse.   This information is not intended to replace advice given to you by your health care provider. Make sure you discuss any questions you have with your health care provider.   Document Released: 07/07/2005 Document Revised: 07/28/2014 Document Reviewed: 01/07/2013 Elsevier Interactive Patient Education 2016 Elsevier Inc.  

## 2015-05-29 ENCOUNTER — Ambulatory Visit
Admission: RE | Admit: 2015-05-29 | Discharge: 2015-05-29 | Disposition: A | Discharge status: Home / Self Care | Payer: Medicare Other | Source: Ambulatory Visit | Attending: General Surgery | Admitting: General Surgery

## 2015-05-29 DIAGNOSIS — C160 Malignant neoplasm of cardia: Secondary | ICD-10-CM

## 2015-05-29 MED ORDER — IOPAMIDOL (ISOVUE-300) INJECTION 61%
77.0000 mL | Freq: Once | INTRAVENOUS | Status: AC | PRN
  Administered 2015-05-29: 77 mL via INTRAVENOUS

## 2015-05-31 ENCOUNTER — Encounter: Payer: Self-pay | Admitting: *Deleted

## 2015-05-31 ENCOUNTER — Telehealth: Payer: Self-pay

## 2015-05-31 ENCOUNTER — Telehealth: Payer: Self-pay | Admitting: Nurse Practitioner

## 2015-05-31 NOTE — Progress Notes (Signed)
Currently has no follow up scheduled with surgeon. Per Dr. Benay Spice request sent Epic message to Dr. Marlowe Aschoff nurse requesting appointment there asap.

## 2015-05-31 NOTE — Telephone Encounter (Signed)
Called and left a message with new appointment for sym managment

## 2015-05-31 NOTE — Telephone Encounter (Signed)
Pt lvm requesting fluids. "I have had a rough week", "I am still throwing up and weak". Called back [phone] pt stated she has been throwing up 2 or 3 times per day off and on. Denies fever, denies constipation or diarrhea, denies resp distress. Dr Barry Dienes called CT results to the pt- no blockage in stomach.Pt prefers morning for appt.  S/w [place] and will set pt up for tomorrow am labs and [facility]. Called pt back with details.

## 2015-06-01 ENCOUNTER — Encounter: Payer: Self-pay | Admitting: *Deleted

## 2015-06-01 ENCOUNTER — Ambulatory Visit (HOSPITAL_BASED_OUTPATIENT_CLINIC_OR_DEPARTMENT_OTHER): Payer: Medicare Other | Admitting: Nurse Practitioner

## 2015-06-01 ENCOUNTER — Telehealth: Payer: Self-pay | Admitting: *Deleted

## 2015-06-01 ENCOUNTER — Other Ambulatory Visit (HOSPITAL_BASED_OUTPATIENT_CLINIC_OR_DEPARTMENT_OTHER): Payer: Medicare Other

## 2015-06-01 ENCOUNTER — Telehealth: Payer: Self-pay | Admitting: Oncology

## 2015-06-01 ENCOUNTER — Encounter: Payer: Self-pay | Admitting: Nurse Practitioner

## 2015-06-01 VITALS — BP 118/76 | HR 69 | Temp 97.7°F | Resp 20 | Ht 61.0 in | Wt 77.4 lb

## 2015-06-01 DIAGNOSIS — R634 Abnormal weight loss: Secondary | ICD-10-CM

## 2015-06-01 DIAGNOSIS — R109 Unspecified abdominal pain: Secondary | ICD-10-CM

## 2015-06-01 DIAGNOSIS — C169 Malignant neoplasm of stomach, unspecified: Secondary | ICD-10-CM

## 2015-06-01 DIAGNOSIS — R112 Nausea with vomiting, unspecified: Secondary | ICD-10-CM

## 2015-06-01 DIAGNOSIS — E860 Dehydration: Secondary | ICD-10-CM

## 2015-06-01 DIAGNOSIS — E876 Hypokalemia: Secondary | ICD-10-CM

## 2015-06-01 LAB — COMPREHENSIVE METABOLIC PANEL (CC13)
ALT: 13 U/L (ref 0–55)
AST: 18 U/L (ref 5–34)
Albumin: 3.4 g/dL — ABNORMAL LOW (ref 3.5–5.0)
Alkaline Phosphatase: 34 U/L — ABNORMAL LOW (ref 40–150)
Anion Gap: 8 mEq/L (ref 3–11)
BILIRUBIN TOTAL: 0.69 mg/dL (ref 0.20–1.20)
BUN: 19.7 mg/dL (ref 7.0–26.0)
CO2: 25 meq/L (ref 22–29)
Calcium: 8.8 mg/dL (ref 8.4–10.4)
Chloride: 106 mEq/L (ref 98–109)
Creatinine: 0.8 mg/dL (ref 0.6–1.1)
EGFR: 73 mL/min/{1.73_m2} — AB (ref >90)
GLUCOSE: 84 mg/dL (ref 70–140)
Potassium: 3.3 mEq/L — ABNORMAL LOW (ref 3.5–5.1)
SODIUM: 140 meq/L (ref 136–145)
TOTAL PROTEIN: 6.2 g/dL — AB (ref 6.4–8.3)

## 2015-06-01 LAB — CBC WITH DIFFERENTIAL/PLATELET
BASO%: 0.5 % (ref 0.0–2.0)
Basophils Absolute: 0 10*3/uL (ref 0.0–0.1)
EOS%: 1.5 % (ref 0.0–7.0)
Eosinophils Absolute: 0.1 10*3/uL (ref 0.0–0.5)
HCT: 42.9 % (ref 34.8–46.6)
HGB: 14.2 g/dL (ref 11.6–15.9)
LYMPH%: 28 % (ref 14.0–49.7)
MCH: 29.2 pg (ref 25.1–34.0)
MCHC: 33 g/dL (ref 31.5–36.0)
MCV: 88.3 fL (ref 79.5–101.0)
MONO#: 0.4 10*3/uL (ref 0.1–0.9)
MONO%: 8.5 % (ref 0.0–14.0)
NEUT%: 61.5 % (ref 38.4–76.8)
NEUTROS ABS: 2.8 10*3/uL (ref 1.5–6.5)
Platelets: 257 10*3/uL (ref 145–400)
RBC: 4.85 10*6/uL (ref 3.70–5.45)
RDW: 13.6 % (ref 11.2–14.5)
WBC: 4.6 10*3/uL (ref 3.9–10.3)
lymph#: 1.3 10*3/uL (ref 0.9–3.3)

## 2015-06-01 MED ORDER — POTASSIUM CHLORIDE 20 MEQ/100ML IV SOLN: 20.0000 meq | Freq: Once | INTRAVENOUS | Status: DC

## 2015-06-01 MED ORDER — SODIUM CHLORIDE 0.9 % IV SOLN: INTRAVENOUS | Status: DC

## 2015-06-01 MED ORDER — SODIUM CHLORIDE 0.9 % IV SOLN
20.0000 meq | Freq: Once | INTRAVENOUS | Status: DC
  Filled 2015-06-01: qty 10

## 2015-06-01 NOTE — Telephone Encounter (Signed)
Lm for rtn call- Dr. Marlowe Aschoff office returned call to Dr. Benay Spice. She would like to plan for pt to have a PICC line placed when paracentesis is completed on Monday. Pt will be sent up for TPN through PICC until Friday June 15, 2015 utilizing home health visits. Pt will have an appt to see Dr. Barry Dienes for a J-tube placement- her office to arrange.

## 2015-06-01 NOTE — Assessment & Plan Note (Addendum)
Patient underwent abdominal surgery per Dr. Barry Dienes secondary to her diagnosis of stomach cancer in September 2015.  Since that time.-Patient has had a firm nodule to the center of her abdominal wall.  Patient states that Dr. Barry Dienes has explained that this is most likely either a chronic suture to the abdominal wall or scar tissue.    Patient now reports a 8-week history of nausea and occasional vomiting; as well as associated generalized abdominal discomfort to the central region of her abdomen when she takes in anything orally.  She states that taking more the one or  2 sips of water makes her stomach hurt.  Due to the abdominal discomfort and associated nausea/vomiting-patient has had minimal oral intake and continues to lose weight.  She is lost another 13 pounds since her last weight check.  She feels dehydrated today.  On exam.-Abdomen is soft and essentially nontender with palpation.  Bowel sounds are positive in all 4 quads.  There is no rebound tenderness.  There is also no flank pain.  Blood counts obtained today revealed a WBC of 4.6, ANC 2.8, hemoglobin 14.2, and platelet count 257.  Patient will receive IV fluid rehydration while the cancer Center today. ___________________________________________________________________  CT scan obtained earlier today revealed ascites.  Patient is scheduled for a therapeutic/diagnostic paracentesis for this coming Monday, 06/04/2015.

## 2015-06-01 NOTE — Assessment & Plan Note (Addendum)
Patient received her last Folfox chemotherapy on 06/19/2014.  She remains in clinical remission.  Patient saw her surgeon Dr. Barry Dienes in September 2016 and underwent a upper and lower GI series study in September 2016 as well.  At that time.-There were no acute findings per patient.  Patient continues to complain of an approximately 8 week history of significant abdominal discomfort and associated nausea/vomiting with any oral intake whatsoever. She feels dehydrated today.   Patient was seen by Dr. Barry Dienes this past Friday, 05/25/2015.  She also underwent a CT with contrast of the abdomen and pelvis this past Tuesday, 05/29/2015. 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 No evidence of leak/extravasation or obstruction.  Mild ascites in the abdomen and pelvis, increasing since prior study.  Moderate stool burden in the colon. ________________________________________________  Pt is scheduled for a diagnostic/therapeutic paracentesis this coming Monday 06/04/2015.  Patient also needs to have a PICC line inserted on Monday, 06/04/2015 as well; and this appointment is pending scheduling.  After long discussion with both patient and her family member-patient has requested to hold on initial laceration of TPN; as well as scheduling of jejunostomy tube.  Patient states that she would prefer to see if the paracentesis provides any relief of her abdominal discomfort prior to making the final decision regarding both TPN and the feeding tube.

## 2015-06-01 NOTE — Assessment & Plan Note (Signed)
Patient weighed approximately 90 pounds in late July 2016; and currently weighs 77.7 pounds.  She becomes nauseous and occasionally vomits with almost any oral intake whatsoever.  Recent CT scan obtained earlier this week revealed no obstruction. _____________________________

## 2015-06-01 NOTE — Assessment & Plan Note (Signed)
Patient continues with nausea and intermittent vomiting whenever she takes in anything orally.  She has minimal appetite.  She continues with weight loss; and feels dehydrated today.  Patient will receive IV fluid rehydration while at the cancer Center today.

## 2015-06-01 NOTE — Progress Notes (Signed)
Oncology Nurse Navigator Documentation  Oncology Nurse Navigator Flowsheets 06/01/2015  Navigator Encounter Type Other-F/U in symptom management clinic  Patient Visit Type Medonc  Treatment Phase Other--s/p surgery  Barriers/Navigation Needs Family concerns--significant weight loss;n/v  Interventions Coordination of Care  Coordination of Care MD Appointments--message to Dr. Barry Dienes for J-tube  Time Spent with Patient 15  Talked with patient regarding her poor nutritional state and need for J-tube until she can resume a normal po intake. She is asking if probiotic would help her digestion and this RN asking MD if Creon could help her belching,cramping,nausea after eating? Per Dr. Marylene Buerger not think Creon or probiotic would help. He spoke with Dr. Barry Dienes and they will have PICC line placed and have TPN started. She will work to have J-tube placed on 11/25. Sent Epic email to Dr. Barry Dienes as reminder as requested.

## 2015-06-01 NOTE — Progress Notes (Signed)
SYMPTOM MANAGEMENT CLINIC   HPI: Lynn Morris 74 y.o. female diagnosed with stomach cancer.  Patient is status post chemotherapy completed November 2015.  Patient is currently undergoing observation only.  Patient underwent abdominal surgery per Dr. Barry Dienes secondary to her diagnosis of stomach cancer in September 2015.  Since that time.-Patient has had a firm nodule to the center of her abdominal wall.  Patient states that Dr. Barry Dienes has explained that this is most likely either a chronic suture to the abdominal wall or scar tissue.    Patient now reports a 8-week history of nausea and occasional vomiting; as well as associated generalized abdominal discomfort to the central region of her abdomen when she takes in anything orally.  She states that taking more the one or  2 sips of water makes her stomach hurt.  Due to the abdominal discomfort and associated nausea/vomiting-patient has had minimal oral intake and continues to lose weight.  She is lost another 13 pounds since her last weight check.  She feels dehydrated today  Abdominal Pain    Review of Systems  Gastrointestinal: Positive for abdominal pain.    Past Medical History  Diagnosis Date  . Esophageal stricture   . Hyperlipemia   . Arthritis     LG TOE  . Cataract     left eye and immature  . Complication of anesthesia   . PONV (postoperative nausea and vomiting)   . Hiatal hernia 12-13-13    intermittent nausea with vomiting  . History of blood transfusion     no abnormal reaction noted, s/p stomach surgery 9'15([facility])  . Pneumonia     touch of it many yrs ago(>62yr)  . History of bronchitis > 89yrago  . History of migraine     many yrs ago  . Esophageal reflux     was on Prilosec;has been off a yr  . Diverticulosis   . Anxiety     takes Zoloft every other day  . Optic neuritis     many yrs ago  . [place]    gastroesophageal carcinoma  . Low blood pressure     thought to be related to  dehydration LOV 08-01-14([facility]) given IV fluids  . Hypokalemia     tx. with potassium supplement  . Depression   . Headache     hx of    Past Surgical History  Procedure Laterality Date  . Abdominal hysterectomy  1978    Partial   . Cesarean section  1964 & 1966  . Nose surgery  1970  . Appendectomy    . Abdominal hysterectomy    . Eus N/A 12/22/2013    Procedure: UPPER ENDOSCOPIC ULTRASOUND (EUS) LINEAR;  Surgeon: DaMilus BanisterMD;  [facility];  Service: Endoscopy;  Laterality: N/A;  . Portacath placement N/A 12/27/2013    Procedure: INSERTION PORT-A-CATH;  Surgeon: FaStark KleinMD;  [facility] Service: General;  Laterality: N/A;  . Esophagogastroduodenoscopy      with dilitation  . Colonoscopy    . Laparoscopy N/A 04/04/2014    Procedure: DIAGNOSTIC LAPAROSCOPY;  Surgeon: FaStark KleinMD;  [facility] Service: General;  Laterality: N/A;  . Partial gastrectomy N/A 04/04/2014    Procedure: TOTAL GASTRECTOMY;  Surgeon: FaStark KleinMD;  [facility] Service: General;  Laterality: N/A;  . Jejunostomy N/A 04/04/2014    Procedure: ESOPHAGOJEJUNOSTOMY AND FEEDING JEJUNOSTOMY;  Surgeon: FaStark KleinMD;  [facility]  Service: General;  Laterality: N/A;/ then removal.-no longer has.  . Esophagogastroduodenoscopy (egd) with propofol N/A 06/26/2014    Procedure: ESOPHAGOGASTRODUODENOSCOPY (EGD) WITH PROPOFOL;  Surgeon: Irene Shipper, MD;  [facility];  Service: Endoscopy;  Laterality: N/A;  . Balloon dilation N/A 06/26/2014    Procedure: BALLOON DILATION;  Surgeon: Irene Shipper, MD;  [facility];  Service: Endoscopy;  Laterality: N/A;  . Esophagogastroduodenoscopy N/A 08/08/2014    Procedure: ESOPHAGOGASTRODUODENOSCOPY (EGD);  Surgeon: Irene Shipper, MD;  [facility];  Service: Endoscopy;  Laterality: N/A;  . Balloon dilation N/A 08/08/2014    Procedure: BALLOON DILATION;  Surgeon: Irene Shipper, MD;  [facility];   Service: Endoscopy;  Laterality: N/A;  . Esophagogastroduodenoscopy N/A 12/26/2014    Procedure: ESOPHAGOGASTRODUODENOSCOPY (EGD);  Surgeon: Irene Shipper, MD;  Location: [facility];  Service: Endoscopy;  Laterality: N/A;  . Balloon dilation N/A 12/26/2014    Procedure: BALLOON DILATION;  Surgeon: Irene Shipper, MD;  [facility];  Service: Endoscopy;  Laterality: N/A;  . 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    is allergic to codeine.    Medication List       This list is accurate as of: 06/01/15 11:59 PM.  Always use your most recent med list.               famotidine 20 MG tablet  Commonly known as:  PEPCID  Take 1 tablet by mouth two  times daily     KLOR-CON M20 20 MEQ tablet  Generic drug:  potassium chloride SA  TAKE 1 TABLET BY MOUTH EVERY DAY     sertraline 100 MG tablet  Commonly known as:  ZOLOFT  Take 100 mg by mouth every other day.     sucralfate 1 GM/10ML suspension  Commonly known as:  CARAFATE  TAKE 10 MLS (1 G TOTAL) BY MOUTH 4 (FOUR) TIMES DAILY - WITH MEALS AND AT BEDTIME.     Vitamin D3 2000 UNITS Tabs  Take 1 tablet by mouth daily.         PHYSICAL EXAMINATION  Oncology Vitals 06/01/2015 05/25/2015 05/23/2015 05/23/2015 02/06/2015 01/09/2015 01/09/2015  Height 155 cm - - 155 cm 155 cm - -  Weight 35.108 kg - - 35.244 kg 41.051 kg - -  Weight (lbs) 77 lbs 6 oz - - 77 lbs 11 oz 90 lbs  8 oz - -  BMI (kg/m2) 14.62 kg/m2 - - 14.68 kg/m2 17.1 kg/m2 - -  Temp 97.7 97 - 97.9 97.7 98.1 -  Pulse 69 65 78 66 62 71 69  Resp 20 18 18 18 18 16 18   SpO2 100 100 100 99 100 100 100  BSA (m2) 1.23 m2 - - 1.23 m2 1.33 m2 - -   BP Readings from Last 3 Encounters:  06/01/15 118/76  05/25/15 117/94  05/23/15 112/76    Physical Exam  Constitutional: She is oriented to person, place, and time. Vital signs are normal. She appears malnourished and dehydrated. She appears cachectic.  Patient  appears fatigued, slightly weak, and extremely thin.  HENT:  Head: Normocephalic and atraumatic.  Mouth/Throat: Oropharynx is clear and moist.  Eyes: Conjunctivae and EOM are normal. Pupils are equal, round, and reactive to light. Right eye exhibits no discharge. Left eye exhibits no discharge. No scleral icterus.  Neck: Normal range of motion. Neck supple. No JVD present. No tracheal deviation present. No thyromegaly present.  Cardiovascular: Normal rate, regular rhythm, normal heart sounds and intact distal pulses.   Pulmonary/Chest: Effort normal and breath sounds normal. No respiratory distress. She has no wheezes. She has no rales. She exhibits no tenderness.  Abdominal: Soft. Bowel sounds are normal. She exhibits mass. She exhibits no distension. There is no tenderness. There is no rebound and no guarding.  Patient has a firm nodule to the central abdomen that is chronic for her.  This area is nontender.  Musculoskeletal: Normal range of motion. She exhibits no edema or tenderness.  Lymphadenopathy:    She has no cervical adenopathy.  Neurological: She is alert and oriented to person, place, and time. Gait normal.  Skin: Skin is warm and dry. No rash noted. No erythema. No pallor.  Psychiatric: Affect normal.    LABORATORY DATA:. Appointment on 06/01/2015  Component Date Value Ref Range Status  . WBC 06/01/2015 4.6  3.9 - 10.3 10e3/uL Final  . NEUT# 06/01/2015 2.8  1.5 - 6.5 10e3/uL Final    . HGB 06/01/2015 14.2  11.6 - 15.9 g/dL Final  . HCT 06/01/2015 42.9  34.8 - 46.6 % Final  . Platelets 06/01/2015 257  145 - 400 10e3/uL Final  . MCV 06/01/2015 88.3  79.5 - 101.0 fL Final  . MCH 06/01/2015 29.2  25.1 - 34.0 pg Final  . MCHC 06/01/2015 33.0  31.5 - 36.0 g/dL Final  . RBC 06/01/2015 4.85  3.70 - 5.45 10e6/uL Final  . RDW 06/01/2015 13.6  11.2 - 14.5 % Final  . lymph# 06/01/2015 1.3  0.9 - 3.3 10e3/uL Final  . MONO# 06/01/2015 0.4  0.1 - 0.9 10e3/uL Final  . Eosinophils Absolute 06/01/2015 0.1  0.0 - 0.5 10e3/uL Final  . Basophils Absolute 06/01/2015 0.0  0.0 - 0.1 10e3/uL Final  . NEUT% 06/01/2015 61.5  38.4 - 76.8 % Final  . LYMPH% 06/01/2015 28.0  14.0 - 49.7 % Final  . MONO% 06/01/2015 8.5  0.0 - 14.0 % Final  . EOS% 06/01/2015 1.5  0.0 - 7.0 % Final  . BASO% 06/01/2015 0.5  0.0 - 2.0 % Final  . Sodium 06/01/2015 140  136 - 145 mEq/L Final  . Potassium 06/01/2015 3.3* 3.5 - 5.1 mEq/L Final  . Chloride 06/01/2015 106  98 - 109 mEq/L Final  . CO2 06/01/2015 25  22 - 29 mEq/L Final  . Glucose 06/01/2015 84  70 - 140 mg/dl Final   Glucose reference range is for nonfasting patients. Fasting glucose reference range is 70- 100.  Marland Kitchen BUN 06/01/2015 19.7  7.0 - 26.0 mg/dL Final  . Creatinine 06/01/2015 0.8  0.6 - 1.1 mg/dL Final  . Total Bilirubin 06/01/2015 0.69  0.20 - 1.20 mg/dL Final  . Alkaline Phosphatase 06/01/2015 34* 40 - 150 U/L Final  . AST 06/01/2015 18  5 - 34 U/L Final  . ALT 06/01/2015 13  0 - 55 U/L Final  . Total Protein 06/01/2015 6.2* 6.4 - 8.3 g/dL Final  . Albumin 06/01/2015 3.4* 3.5 - 5.0 g/dL Final  . Calcium 06/01/2015 8.8  8.4 - 10.4 mg/dL Final  .  Anion Gap 06/01/2015 8  3 - 11 mEq/L Final  . EGFR 06/01/2015 73* >90 ml/min/1.73 m2 Final   eGFR is calculated using the CKD-EPI Creatinine Equation (2009)     RADIOGRAPHIC STUDIES: No results found.  ASSESSMENT/PLAN:    Gastric cancer Patient received her last Folfox chemotherapy on  06/19/2014.  She remains in clinical remission.  Patient saw her surgeon Dr. Barry Dienes in September 2016 and underwent a upper and lower GI series study in September 2016 as well.  At that time.-There were no acute findings per patient.  Patient continues to complain of an approximately 8 week history of significant abdominal discomfort and associated nausea/vomiting with any oral intake whatsoever. She feels dehydrated today.   Patient was seen by Dr. Barry Dienes this past Friday, 05/25/2015.  She also underwent a CT with contrast of the abdomen and pelvis this past Tuesday, 05/29/2015. 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 No evidence of leak/extravasation or obstruction.  Mild ascites in the abdomen and pelvis, increasing since prior study.  Moderate stool burden in the colon. ________________________________________________  Pt is scheduled for a diagnostic/therapeutic paracentesis this coming Monday 06/04/2015.  Patient also needs to have a PICC line inserted on Monday, 06/04/2015 as well; and this appointment is pending scheduling.  After long discussion with both patient and her family member-patient has requested to hold on initial laceration of TPN; as well as scheduling of jejunostomy tube.  Patient states that she would prefer to see if the paracentesis provides any relief of her abdominal discomfort prior to making the final decision regarding both TPN and the feeding tube.     Nausea with vomiting Patient continues with nausea and intermittent vomiting whenever she takes in anything orally.  She has minimal appetite.  She continues with weight loss; and feels dehydrated today.  Patient will receive IV fluid rehydration while at the [facility] today. _____________________________________________________________  Dehydration Patient continues with nausea and intermittent vomiting whenever she takes in anything  orally.  She has minimal appetite.  She continues with weight loss; and feels dehydrated today.  Patient will receive IV fluid rehydration while at the cancer Center today.  Abdominal pain Patient underwent abdominal surgery per Dr. Barry Dienes secondary to her diagnosis of stomach cancer in September 2015.  Since that time.-Patient has had a firm nodule to the center of her abdominal wall.  Patient states that Dr. Barry Dienes has explained that this is most likely either a chronic suture to the abdominal wall or scar tissue.    Patient now reports a 8-week history of nausea and occasional vomiting; as well as associated generalized abdominal discomfort to the central region of her abdomen when she takes in anything orally.  She states that taking more the one or  2 sips of water makes her stomach hurt.  Due to the abdominal discomfort and associated nausea/vomiting-patient has had minimal oral intake and continues to lose weight.  She is lost another 13 pounds since her last weight check.  She feels dehydrated today.  On exam.-Abdomen is soft and essentially nontender with palpation.  Bowel sounds are positive in all 4 quads.  There is no rebound tenderness.  There is also no flank pain.  Blood counts obtained today revealed a WBC of 4.6, ANC 2.8, hemoglobin 14.2, and platelet count 257.  Patient will receive IV fluid rehydration while the cancer Center today. ___________________________________________________________________  CT scan obtained earlier today revealed ascites.  Patient is scheduled for a therapeutic/diagnostic paracentesis  for this coming Monday, 06/04/2015.    Weight loss Patient weighed approximately 90 pounds in late July 2016; and currently weighs 77.7 pounds.  She becomes nauseous and occasionally vomits with almost any oral intake whatsoever.  Recent CT scan obtained earlier this week revealed no obstruction. _____________________________  Hypokalemia Potassium was 3.3 today;  despite patient taking potassium 20 mEq tablet daily.  Patient will receive an additional 20 mEq of potassium IV today while at the cancer center receiving her infusion.  She will also continue to take her potassium at home as directed.   Patient stated understanding of all instructions; and was in agreement with this plan of care. The patient knows to call the clinic with any problems, questions or concerns.   This was a shared visit with Dr. Benay Spice today.  Total time spent with patient was 40 minutes;  with greater than 75 percent of that time spent in face to face counseling regarding patient's symptoms,  and coordination of care and follow up.  Disclaimer:This dictation was prepared with Dragon/digital dictation along with [employer]. Any transcriptional errors that result from this process are unintentional.  Drue Second, NP 06/03/2015   This was a shared visit with Drue Second. Ms. Macbride was interviewed and examined. The etiology of the recurrent nausea/vomiting and abdominal pain is unclear. I discussed the case with Dr. Barry Dienes. The plan is to proceed with a diagnostic paracentesis. She will undergo PICC placement and then begin home TNA. Dr. Barry Dienes will place a jejunal feeding tube within the next few weeks.  Julieanne Manson, M.D.

## 2015-06-01 NOTE — Assessment & Plan Note (Signed)
Patient continues with nausea and intermittent vomiting whenever she takes in anything orally.  She has minimal appetite.  She continues with weight loss; and feels dehydrated today.  Patient will receive IV fluid rehydration while at the [facility] today. _____________________________________________________________

## 2015-06-01 NOTE — Telephone Encounter (Signed)
Left message on voicemail for IR requesting PICC Placement 11/14 scheduled around paracentesis if at all possible. IR to contact patient - order in EPIC. pof sent 11/11 5:21 pm. No other orders per 11/11 pof.

## 2015-06-01 NOTE — Assessment & Plan Note (Signed)
Potassium was 3.3 today; despite patient taking potassium 20 mEq tablet daily.  Patient will receive an additional 20 mEq of potassium IV today while at the cancer center receiving her infusion.  She will also continue to take her potassium at home as directed.

## 2015-06-04 ENCOUNTER — Telehealth: Payer: Self-pay | Admitting: Internal Medicine

## 2015-06-04 ENCOUNTER — Ambulatory Visit (HOSPITAL_COMMUNITY)
Admission: RE | Admit: 2015-06-04 | Discharge: 2015-06-04 | Disposition: A | Discharge status: Home / Self Care | Payer: Medicare Other | Source: Ambulatory Visit | Attending: Nurse Practitioner | Admitting: Nurse Practitioner

## 2015-06-04 ENCOUNTER — Other Ambulatory Visit: Payer: Self-pay | Admitting: General Surgery

## 2015-06-04 DIAGNOSIS — R109 Unspecified abdominal pain: Secondary | ICD-10-CM

## 2015-06-04 DIAGNOSIS — R112 Nausea with vomiting, unspecified: Secondary | ICD-10-CM

## 2015-06-04 DIAGNOSIS — Z85028 Personal history of other malignant neoplasm of stomach: Secondary | ICD-10-CM | POA: Diagnosis not present

## 2015-06-04 DIAGNOSIS — C169 Malignant neoplasm of stomach, unspecified: Secondary | ICD-10-CM

## 2015-06-04 DIAGNOSIS — R634 Abnormal weight loss: Secondary | ICD-10-CM

## 2015-06-04 DIAGNOSIS — R188 Other ascites: Secondary | ICD-10-CM | POA: Insufficient documentation

## 2015-06-04 NOTE — Telephone Encounter (Signed)
Pt states she thinks she is having side effects from the carafate. States after she eats only a small amount she is very bloated and has stomach pain along with nausea and vomiting. Pt states she stopped the carafate last night but wants to know what Dr. Henrene Pastor would suggest. Please advise.

## 2015-06-04 NOTE — Telephone Encounter (Signed)
More likely partial bowel blockage. Needs to continue Carafate. Order contrast CT abdomen and pelvis "history of gastric cancer, N/V, and abdominal pain"

## 2015-06-04 NOTE — Telephone Encounter (Signed)
Thanks. Reviewed CT. Has some fluid which is being sampled. Does not need repeat CT, then. Continue Carafate (for history of severe esophagitis) and management per Dr Barry Dienes

## 2015-06-04 NOTE — Telephone Encounter (Signed)
Pt states she just had CT of A/P 05/29/15 that Dr. Norman Clay ordered. Please advise.

## 2015-06-04 NOTE — Procedures (Signed)
Successful US guided paracentesis from RLQ.  Yielded 80 ml of serous colored fluid. Larger lower pelvic fluid collection unable to be accessed percutaneously secondary to proximity of blood vessels. No immediate complications.  Pt tolerated well.   Specimen was sent for labs.  Tsosie Billing D PA-C 06/04/2015 10:30 AM

## 2015-06-05 ENCOUNTER — Telehealth: Payer: Self-pay | Admitting: *Deleted

## 2015-06-05 NOTE — Telephone Encounter (Signed)
Spoke with pt and she is aware.

## 2015-06-05 NOTE — Telephone Encounter (Signed)
LM for rtn call for a status update following paracentesis completed on 11/14

## 2015-06-07 ENCOUNTER — Encounter: Payer: Self-pay | Admitting: *Deleted

## 2015-06-07 ENCOUNTER — Other Ambulatory Visit (HOSPITAL_COMMUNITY): Payer: Medicare Other

## 2015-06-07 ENCOUNTER — Ambulatory Visit (HOSPITAL_BASED_OUTPATIENT_CLINIC_OR_DEPARTMENT_OTHER): Payer: Medicare Other | Admitting: Oncology

## 2015-06-07 ENCOUNTER — Telehealth: Payer: Self-pay | Admitting: Oncology

## 2015-06-07 VITALS — BP 92/55 | HR 60 | Temp 97.7°F | Resp 18 | Ht 61.0 in | Wt 76.8 lb

## 2015-06-07 DIAGNOSIS — C169 Malignant neoplasm of stomach, unspecified: Secondary | ICD-10-CM

## 2015-06-07 DIAGNOSIS — C160 Malignant neoplasm of cardia: Secondary | ICD-10-CM

## 2015-06-07 NOTE — Telephone Encounter (Signed)
Gave and printed appts ched and avs for pt for DEC  °

## 2015-06-07 NOTE — Progress Notes (Signed)
[facility] OFFICE PROGRESS NOTE   Diagnosis: Gastroesophageal carcinoma  INTERVAL HISTORY:   Ms. Benzing returns as scheduled. She has noted improvement in the nausea and abdominal pain since discontinuing Carafate. She is tolerating some liquids and solids. She underwent a diagnostic paracentesis on 06/04/2015. The cytology [id number] confirmed malignant cells consistent with poorly differential carcinoma, consistent with poorly differentiated gastric carcinoma.   Objective:  Vital signs in last 24 hours:  Blood pressure 92/55, pulse 60, temperature 97.7 F (36.5 C), temperature source Oral, resp. rate 18, height 5\' 1"  (1.549 m), weight 76 lb 12.8 oz (34.836 kg), SpO2 100 %.    Resp: Lungs clear bilaterally Cardio: Regular rate and rhythm GI: Nondistended, no hepatomegaly, no mass, firm nodularity at the midportion of the abdominal scar Vascular: No leg edema   Lab Results:  Lab Results  Component Value Date   WBC 4.6 06/01/2015   HGB 14.2 06/01/2015   HCT 42.9 06/01/2015   MCV 88.3 06/01/2015   PLT 257 06/01/2015   NEUTROABS 2.8 06/01/2015     Imaging:  US Paracentesis  06/04/2015  INDICATION: History of gastric cancer, ascites with request for diagnostic paracentesis. EXAM: ULTRASOUND-GUIDED PARACENTESIS COMPARISON:  CT Abdomen/Pelvis 05/29/2015. MEDICATIONS: None. COMPLICATIONS: None immediate TECHNIQUE: Informed written consent was obtained from the patient after a discussion of the risks, benefits and alternatives to treatment. A timeout was performed prior to the initiation of the procedure. Initial ultrasound scanning demonstrates a small amount of ascites within the right lower abdominal quadrant, larger lower pelvic fluid collection unable to be accessed percutaneously secondary to proximity of blood vessels. The right lower abdomen was prepped and draped in the usual sterile fashion. 1% lidocaine was used for local anesthesia. Under direct  ultrasound guidance, a 19 gauge, 7-cm, Yueh catheter was introduced. An ultrasound image was saved for documentation purposed. The paracentesis was performed. The catheter was removed and a dressing was applied. The patient tolerated the procedure well without immediate post procedural complication. FINDINGS: A total of approximately 80 ml of serous fluid was removed. Samples were sent to the laboratory as requested by the clinical team. IMPRESSION: Successful ultrasound-guided paracentesis yielding 80 ml of peritoneal fluid. Read By:  Tsosie Billing PA-C Electronically Signed   By: Sandi Mariscal M.D.   On: 06/04/2015 10:29    Medications: I have reviewed the patient's current medications.  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   Cycle 1 CAPOX beginning 01/02/2014.   Cycle 2 CAPOX beginning 01/23/2014.   Cycle 3 CAPOX beginning 02/13/2014.   Restaging PET scan 02/27/2014 with mild hypermetabolism in the hilar regions with an SUV max on the right of 4.9. Previously seen mild hypermetabolism associated with the distal esophagus not readily identified. Mild uptake in the stomach  without a  focal lesion or uptake above blood pool.   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  Cycle 1 adjuvant FOLFOX 05/08/2014  Cycle 2 held 05/29/2014 due to neutropenia.  Cycle 2 adjuvant FOLFOX 06/05/2014 with Neulasta support.  Cycle 3 adjuvant FOLFOX 06/19/2014 with Neulasta support.  CT abdomen and pelvis 05/29/2015-postoperative changes, mild ascites  Diagnostic paracentesis 06/04/2015 confirmed metastatic poorly differential carcinoma consistent with gastric cancer 2. History of gastroesophageal reflux disease and peptic stricture, status post an esophageal dilatation procedure in August 2012 3. History of solid dysphagia secondary to #1.  4. Anxiety. 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 6. Anorexia. Trial of Remeron initiated 05/08/2014. 7. Nausea and vomiting-persistent following FOLFOX 05/08/2014. Improved since beginning Reglan. 8. Neutropenia secondary to chemotherapy, cycle 2 FOLFOX held 05/29/2014. 9. Status post upper endoscopy 06/26/2014 with findings of severe ulcerative esophagitis; 10 mm anastomotic stricture status post balloon dilatation to 12 mm. 10. Status post upper endoscopy 08/08/2014 with findings of severe ulcerative esophagitis distally; 7 mm anastomotic stricture status post dilatation to 13.5 mm. Status post upper endoscopy 12/26/2014. Improved esophagitis; anastomotic stricture with improved diameter from previous exam. Status post dilatation. 11. Port-A-Cath removal 01/09/2015. 12. 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 Malnutrition   Disposition:  Ms. Lantzer has been diagnosed with metastatic gastric cancer. I discussed the diagnosis and prognosis with Ms. Gaspar and her husband. She understands no therapy will be curative. There is no clear  survival benefit with systemic chemotherapy in this setting. She is not a candidate for chemotherapy with her current performance status and severe malnutrition.  She is interested in undergoing placement of a jejunal feeding tube if Dr. Barry Dienes feels this is indicated. If her nutritional status and performance status improves we could consider a trial of systemic therapy.  We reviewed CPR and ACLS issues. She will be placed on a no CODE BLUE status.  Ms. Lyu will return for an office visit in approximately 2 weeks.  Betsy Coder, MD  06/07/2015  4:28 PM

## 2015-06-08 NOTE — Progress Notes (Signed)
Oncology Nurse Navigator Documentation  Oncology Nurse Navigator Flowsheets 06/07/2015  Navigator Encounter Type F/U-  Patient Visit Type Medonc  Treatment Phase Supportive care  Barriers/Navigation Needs Family concerns  Interventions Coordination of Care--message to Dr. Barry Dienes regarding J-tube  Coordination of Care Other--notified dietician of need to prepare tube feeding orders  Time Spent with Patient 30  Patient is aware that chemo will not be curative, but she wants to continue if her nutritional status can improve. Has had less pain and heartburn since the paracentesis. Did share with MD she wanted to be DNR.

## 2015-06-12 ENCOUNTER — Ambulatory Visit (HOSPITAL_BASED_OUTPATIENT_CLINIC_OR_DEPARTMENT_OTHER): Payer: Medicare Other | Admitting: Nurse Practitioner

## 2015-06-12 ENCOUNTER — Encounter: Payer: Self-pay | Admitting: Nurse Practitioner

## 2015-06-12 ENCOUNTER — Other Ambulatory Visit: Payer: Self-pay | Admitting: *Deleted

## 2015-06-12 VITALS — BP 113/71 | HR 64 | Resp 17 | Ht 61.0 in | Wt 75.8 lb

## 2015-06-12 DIAGNOSIS — E440 Moderate protein-calorie malnutrition: Secondary | ICD-10-CM

## 2015-06-12 DIAGNOSIS — R109 Unspecified abdominal pain: Secondary | ICD-10-CM

## 2015-06-12 DIAGNOSIS — C169 Malignant neoplasm of stomach, unspecified: Secondary | ICD-10-CM | POA: Diagnosis not present

## 2015-06-12 DIAGNOSIS — Z66 Do not resuscitate: Secondary | ICD-10-CM

## 2015-06-12 DIAGNOSIS — R112 Nausea with vomiting, unspecified: Secondary | ICD-10-CM | POA: Diagnosis not present

## 2015-06-12 DIAGNOSIS — E860 Dehydration: Secondary | ICD-10-CM

## 2015-06-12 DIAGNOSIS — R634 Abnormal weight loss: Secondary | ICD-10-CM

## 2015-06-12 MED ORDER — ONDANSETRON HCL 4 MG/2ML IJ SOLN: 8.0000 mg | INTRAMUSCULAR | Status: DC

## 2015-06-12 MED ORDER — SODIUM CHLORIDE 0.9 % IV SOLN
Freq: Once | INTRAVENOUS | Status: AC
  Administered 2015-06-12: 11:00:00 via INTRAVENOUS
  Filled 2015-06-12: qty 4

## 2015-06-12 MED ORDER — SODIUM CHLORIDE 0.9 % IV SOLN
Freq: Once | INTRAVENOUS | Status: AC
  Administered 2015-06-12: 11:00:00 via INTRAVENOUS

## 2015-06-12 NOTE — Patient Instructions (Signed)
Dehydration Dehydration is a condition in which you do not have enough fluid or water in your body. Your body needs a certain amount of water and other fluid to maintain its blood volume. During exercise, your body may not be able to maintain the fluid levels that are needed to function properly. Dehydration happens when you take in less fluid than you lose. Athletes lose fluid during exercise when they sweat and breathe. Additional fluid is lost during urination, vomiting, and diarrhea. To prevent dehydration, it is important for athletes to take in enough water and fluid to replace the fluid that they lose during exercise. CAUSES Common causes of dehydration among athletes include:  Diarrhea.  Vomiting.  Not drinking enough fluid during strenuous exercise or during an illness.  Not eating enough food during strenuous exercise or during an illness.  Not consuming enough fluid or food after strenuous exercise.  Exercising in hot or humid weather. RISK FACTORS This condition is more likely to develop in:  Athletes who are taking certain medicines that cause the body to lose excess fluid (diuretics).  Athletes who have a chronic illness, such as diabetes.  Young children.  Older adults.  Athletes who live at high altitudes.  Endurance athletes. SYMPTOMS  Mild Dehydration  Thirst.  Dry lips.  Slightly dry mouth.  Dry, warm skin. Moderate Dehydration  Very dry mouth.  Muscle cramps.  Dark urine and decreased urine production.  Decreased tear production.  Headache.  Light-headedness, especially when you stand up from a sitting position. Severe Dehydration  Changes in skin.  Blue lips.  Skin does not spring back quickly when lightly pinched and released.  Changes in body fluids.  Extreme thirst.  No tears.  Not able to sweat when body temperature is high, such as in hot weather.  Minimal urine production.  Changes in vital signs.  Rapid, weak pulse  (more than 100 beats per minute when you are sitting still).  Rapid breathing.  Low blood pressure.  Unconsciousness.  Other changes.  Sunken eyes.  Cold hands and feet.  Confusion and lethargy.  Difficulty being awakened.  Fainting (syncope).  Short-term weight loss. DIAGNOSIS This condition may be diagnosed based on your symptoms. You may also have tests to determine how severe your dehydration is. These tests may include:  Urine tests.  Blood tests. TREATMENT Dehydration should be treated right away. Do not wait until dehydration becomes severe. Treatment depends on the severity of the dehydration. Treatment for Mild Dehydration  Drinking plenty of water to replace the fluid you have lost.  Replacing minerals in your blood (electrolytes) that you may have lost. Treatment for Moderate Dehydration  Consuming oral rehydration solution (ORS). Treatment for Severe Dehydration  Receiving fluid through an IV tube.  Receiving electrolyte solution through a feeding tube that is passed through your nose and into your stomach (nasogastric tube or NG tube). HOME CARE INSTRUCTIONS  Drink enough fluid to keep your urine clear or pale yellow.  Drink water or fluid slowly by taking small sips.  Have food or beverages that contain electrolytes. Examples include salt, bananas, and sports drinks.  Take over-the-counter and prescription medicines only as told by your health care provider.  Prepare ORS according to the manufacturer's instructions. Take sips of ORS every 5 minutes until your urine returns to normal.  If you have vomiting or diarrhea, continue to try to drink water, ORS, or both.  If you have diarrhea, avoid:  Beverages that contain caffeine.  Fruit juice.  Milk.  Carbonated soft drinks.  Do not take salt tablets. This can lead to the condition of having too much sodium in your body (hypernatremia). PREVENTION  Drink water before, during, and after  physical activity, even if you do not feel thirsty. Drink small amounts of water frequently throughout sporting events. Drink more water if you are exercising in hot or humid weather or in high altitudes.  If you are exercising for more than an hour, consider drinking a sports drink.  If you are experiencing vomiting or diarrhea, avoid exercise.  Before and after exercise, eat plenty of foods that have a high water content. These include fruits and vegetables.  Avoid alcohol before, during, and after strenuous exercise. SEEK MEDICAL CARE IF:  You cannot eat or drink without vomiting.  You have severe diarrhea with vomiting or a fever.  You have severe diarrhea without vomiting or a fever.  You have had moderate diarrhea during a period of more than 24 hours. SEEK IMMEDIATE MEDICAL CARE IF:  You have extreme thirst.  You have not urinated in 6-8 hours, or you have urinated only a small amount of very dark urine.  You have shriveled skin.  You are dizzy, confused, or both.   This information is not intended to replace advice given to you by your health care provider. Make sure you discuss any questions you have with your health care provider.   Document Released: 07/07/2005 Document Revised: 03/28/2015 Document Reviewed: 07/21/2014 Elsevier Interactive Patient Education [employer].

## 2015-06-12 NOTE — Patient Instructions (Signed)
Lynn Morris  06/12/2015   Your procedure is scheduled on: 06/17/2015    Report to [facility]  Entrance take [place]  elevators to 3rd floor to  [place] at    [place] AM.  Call this number if you have problems the morning of surgery [phone]   Remember: ONLY 1 PERSON MAY GO WITH YOU TO SHORT STAY TO GET  READY MORNING OF [place].  Do not eat food or drink liquids :After Midnight.               FLEETS ENEMA NITE BEFORE SURGERY.    Take these medicines the morning of surgery with A SIP OF WATER: Pepcid                                You may not have any metal on your body including hair pins and              piercings  Do not wear jewelry, make-up, lotions, powders or perfumes, deodorant             Do not wear nail polish.  Do not shave  48 hours prior to surgery.               Do not bring valuables to the hospital. [place].  Contacts, dentures or bridgework may not be worn into surgery.  Leave suitcase in the car. After surgery it may be brought to your room.       Special Instructions: coughing and deep breathing exercises, leg exercises               Please read over the following fact sheets you were given: _____________________________________________________________________             [facility] - Preparing for Surgery Before surgery, you can play an important role.  Because skin is not sterile, your skin needs to be as free of germs as possible.  You can reduce the number of germs on your skin by washing with CHG (chlorahexidine gluconate) soap before surgery.  CHG is an antiseptic cleaner which kills germs and bonds with the skin to continue killing germs even after washing. Please DO NOT use if you have an allergy to CHG or antibacterial soaps.  If your skin becomes reddened/irritated stop using the CHG and inform your nurse when you arrive at Short Stay. Do not shave  (including legs and underarms) for at least 48 hours prior to the first CHG shower.  You may shave your face/neck. Please follow these instructions carefully:  1.  Shower with CHG Soap the night before surgery and the  morning of Surgery.  2.  If you choose to wash your hair, wash your hair first as usual with your  normal  shampoo.  3.  After you shampoo, rinse your hair and body thoroughly to remove the  shampoo.                           4.  Use CHG as you would any other liquid soap.  You can apply chg directly  to the skin and wash  Gently with a scrungie or clean washcloth.  5.  Apply the CHG Soap to your body ONLY FROM THE NECK DOWN.   Do not use on face/ open                           Wound or open sores. Avoid contact with eyes, ears mouth and genitals (private parts).                       Wash face,  Genitals (private parts) with your normal soap.             6.  Wash thoroughly, paying special attention to the area where your surgery  will be performed.  7.  Thoroughly rinse your body with warm water from the neck down.  8.  DO NOT shower/wash with your normal soap after using and rinsing off  the CHG Soap.                9.  Pat yourself dry with a clean towel.            10.  Wear clean pajamas.            11.  Place clean sheets on your bed the night of your first shower and do not  sleep with pets. Day of Surgery : Do not apply any lotions/deodorants the morning of surgery.  Please wear clean clothes to the hospital/surgery center.  FAILURE TO FOLLOW THESE INSTRUCTIONS MAY RESULT IN THE CANCELLATION OF YOUR SURGERY PATIENT SIGNATURE_________________________________  NURSE SIGNATURE__________________________________  ________________________________________________________________________  WHAT IS A BLOOD TRANSFUSION? Blood Transfusion Information  A transfusion is the replacement of blood or some of its parts. Blood is made up of multiple cells which  provide different functions.  Red blood cells carry oxygen and are used for blood loss replacement.  White blood cells fight against infection.  Platelets control bleeding.  Plasma helps clot blood.  Other blood products are available for specialized needs, such as hemophilia or other clotting disorders. BEFORE THE TRANSFUSION  Who gives blood for transfusions?   Healthy volunteers who are fully evaluated to make sure their blood is safe. This is blood bank blood. Transfusion therapy is the safest it has ever been in the practice of medicine. Before blood is taken from a donor, a complete history is taken to make sure that person has no history of diseases nor engages in risky social behavior (examples are intravenous drug use or sexual activity with multiple partners). The donor's travel history is screened to minimize risk of transmitting infections, such as malaria. The donated blood is tested for signs of infectious diseases, such as HIV and hepatitis. The blood is then tested to be sure it is compatible with you in order to minimize the chance of a transfusion reaction. If you or a relative donates blood, this is often done in anticipation of surgery and is not appropriate for emergency situations. It takes many days to process the donated blood. RISKS AND COMPLICATIONS Although transfusion therapy is very safe and saves many lives, the main dangers of transfusion include:   Getting an infectious disease.  Developing a transfusion reaction. This is an allergic reaction to something in the blood you were given. Every precaution is taken to prevent this. The decision to have a blood transfusion has been considered carefully by your caregiver before blood is given. Blood is not given unless the benefits outweigh  the risks. AFTER THE TRANSFUSION  Right after receiving a blood transfusion, you will usually feel much better and more energetic. This is especially true if your red blood cells  have gotten low (anemic). The transfusion raises the level of the red blood cells which carry oxygen, and this usually causes an energy increase.  The nurse administering the transfusion will monitor you carefully for complications. HOME CARE INSTRUCTIONS  No special instructions are needed after a transfusion. You may find your energy is better. Speak with your caregiver about any limitations on activity for underlying diseases you may have. SEEK MEDICAL CARE IF:   Your condition is not improving after your transfusion.  You develop redness or irritation at the intravenous (IV) site. SEEK IMMEDIATE MEDICAL CARE IF:  Any of the following symptoms occur over the next 12 hours:  Shaking chills.  You have a temperature by mouth above 102 F (38.9 C), not controlled by medicine.  Chest, back, or muscle pain.  People around you feel you are not acting correctly or are confused.  Shortness of breath or difficulty breathing.  Dizziness and fainting.  You get a rash or develop hives.  You have a decrease in urine output.  Your urine turns a dark color or changes to pink, red, or brown. Any of the following symptoms occur over the next 10 days:  You have a temperature by mouth above 102 F (38.9 C), not controlled by medicine.  Shortness of breath.  Weakness after normal activity.  The white part of the eye turns yellow (jaundice).  You have a decrease in the amount of urine or are urinating less often.  Your urine turns a dark color or changes to pink, red, or brown. Document Released: 07/04/2000 Document Revised: 09/29/2011 Document Reviewed: 02/21/2008 [facility] Patient Information 2014 [employer] [place], [place].  _______________________________________________________________________

## 2015-06-12 NOTE — Progress Notes (Signed)
Pt reported to the [facility] [place] and filled out a walk in form stating she needed fluids. Pt has a history of severe malnutrition. Labs and POF sent to schedule.

## 2015-06-12 NOTE — Assessment & Plan Note (Addendum)
Patient complains of chronic /progressive generalized abdominal discomfort for the past 6 weeks or so. She has developed some mild ascites; and recent paracentesis was obtained. Cytology of fluid obtained via paracentesis revealed progression of disease.    patient has requested a hospice referral for further evaluation and management ;  And has made the decision to become a DO NOT RESUSCITATE as well.   Confirmed the patient does have pain medication to take at home if required.

## 2015-06-12 NOTE — Assessment & Plan Note (Signed)
Patient received her last Folfox chemotherapy on 06/19/2014.   Patient  Has been complaining of progressive abdominal discomfort for the past 6 weeks.  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   patient continues to complain of minimal appetite and poor oral intake.  She also complains of chronic nausea but only occasional vomiting.  She continues to feel dehydrated today; but refuses IV fluid rehydration.    patient has refused a PICC line or the initiation of TPN. However , patient is scheduled 2 obtain a jejunostomy tube for feedings this coming Friday  November 25th 2016 by Dr. Barry Dienes.  General Psychologist, sport and exercise.   Also, patient is requesting a hospice referral and confirm that she wishes to become a DO NOT RESUSCITATE as well.   Hospice referral will be placed today.   Patient will return to the [facility] for a follow-up visit only on 06/28/2015.

## 2015-06-12 NOTE — Assessment & Plan Note (Signed)
Patient has chronic nausea, minimal appetite, poor oral intake, and dehydration.  She continues to lose weight.   Most likely, these symptoms are secondary to recent progression of disease.   Patient is scheduled to undergo a jejunostomy tube placement this coming Friday 05/24/2015.

## 2015-06-12 NOTE — Progress Notes (Signed)
SYMPTOM MANAGEMENT CLINIC   HPI: Lynn Morris 74 y.o. female diagnosed with stomach cancer.  Patient is status post chemotherapy completed November 2015.  Patient is currently undergoing observation only.  Patient received her last Folfox chemotherapy on 06/19/2014.   Patient  Has been complaining of progressive abdominal discomfort for the past 6 weeks.  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   patient continues to complain of minimal appetite and poor oral intake.  She also complains of chronic nausea but only occasional vomiting.  She continues to feel dehydrated today; but refuses IV fluid rehydration.    patient has refused a PICC line or the initiation of TPN. However , patient is scheduled 2 obtain a jejunostomy tube for feedings this coming Friday  November 25th 2016 by Dr. Barry Dienes.  General Psychologist, sport and exercise.   Also, patient is requesting a hospice referral and confirm that she wishes to become a DO NOT RESUSCITATE as well.   Hospice referral will be placed today.   Patient will return to the [facility] for a follow-up visit only on 06/28/2015.   Abdominal Pain    Review of Systems  Gastrointestinal: Positive for abdominal pain.    Past Medical History  Diagnosis Date  . Esophageal stricture   . Hyperlipemia   . Arthritis     LG TOE  . Cataract     left eye and immature  . Complication of anesthesia   . PONV (postoperative nausea and vomiting)   . Hiatal hernia 12-13-13    intermittent nausea with vomiting  . History of blood transfusion     no abnormal reaction noted, s/p stomach surgery 9'15([facility])  . Pneumonia     touch of it many yrs ago(>64yr)  . History of bronchitis > 89yrago  . History of migraine     many yrs ago  . Esophageal reflux     was on Prilosec;has been off a yr  . Diverticulosis    . Anxiety     takes Zoloft every other day  . Optic neuritis     many yrs ago  [place]    gastroesophageal carcinoma  . Low blood pressure     thought to be related to dehydration LOV 08-01-14([facility]) given IV fluids  . Hypokalemia     tx. with potassium supplement  . Depression   . Headache     hx of    Past Surgical History  Procedure Laterality Date  . Abdominal hysterectomy  1978    Partial   . Cesarean section  1964 & 1966  . Nose surgery  1970  . Appendectomy    . Abdominal hysterectomy    . Eus N/A 12/22/2013    Procedure: UPPER ENDOSCOPIC ULTRASOUND (EUS) LINEAR;  Surgeon: DaMilus BanisterMD;  [facility];  Service: Endoscopy;  Laterality: N/A;  . Portacath placement N/A 12/27/2013    Procedure: INSERTION PORT-A-CATH;  Surgeon: FaStark KleinMD;  [facility] Service: General;  Laterality: N/A;  . Esophagogastroduodenoscopy      with dilitation  . Colonoscopy    . Laparoscopy N/A 04/04/2014    Procedure: DIAGNOSTIC LAPAROSCOPY;  Surgeon: FaStark KleinMD;  [facility] Service: General;  Laterality: N/A;  . Partial gastrectomy N/A 04/04/2014    Procedure: TOTAL GASTRECTOMY;  Surgeon: FaStark KleinMD;  [facility]  Service: General;  Laterality: N/A;  . Jejunostomy N/A 04/04/2014    Procedure: ESOPHAGOJEJUNOSTOMY AND FEEDING JEJUNOSTOMY;  Surgeon: Stark Klein, MD;  Location: [place];  Service: General;  Laterality: N/A;/ then removal.-no longer has.  . Esophagogastroduodenoscopy (egd) with propofol N/A 06/26/2014    Procedure: ESOPHAGOGASTRODUODENOSCOPY (EGD) WITH PROPOFOL;  Surgeon: Irene Shipper, MD;  [facility];  Service: Endoscopy;  Laterality: N/A;  . Balloon dilation N/A 06/26/2014    Procedure: BALLOON DILATION;  Surgeon: Irene Shipper, MD;  [facility];  Service: Endoscopy;  Laterality: N/A;  . Esophagogastroduodenoscopy N/A 08/08/2014    Procedure: ESOPHAGOGASTRODUODENOSCOPY (EGD);  Surgeon: Irene Shipper, MD;   [facility];  Service: Endoscopy;  Laterality: N/A;  . Balloon dilation N/A 08/08/2014    Procedure: BALLOON DILATION;  Surgeon: Irene Shipper, MD;  [facility];  Service: Endoscopy;  Laterality: N/A;  . Esophagogastroduodenoscopy N/A 12/26/2014    Procedure: ESOPHAGOGASTRODUODENOSCOPY (EGD);  Surgeon: Irene Shipper, MD;  Location: [facility];  Service: Endoscopy;  Laterality: N/A;  . Balloon dilation N/A 12/26/2014    Procedure: BALLOON DILATION;  Surgeon: Irene Shipper, MD;  [facility];  Service: Endoscopy;  Laterality: N/A;  . 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    is allergic to codeine.    Medication List       This list is accurate as of: 06/12/15  6:20 PM.  Always use your most recent med list.               famotidine 20 MG tablet  Commonly known as:  PEPCID  Take 1 tablet by mouth two  times daily     KLOR-CON M20 20 MEQ tablet  Generic drug:  potassium chloride SA  TAKE 1 TABLET BY MOUTH EVERY DAY     ondansetron 8 MG tablet  Commonly known as:  ZOFRAN  Take 8 mg by mouth every 8 (eight) hours as needed.     sertraline 100 MG tablet  Commonly known as:  ZOLOFT  Take 100 mg by mouth every other day.     sucralfate 1 GM/10ML suspension  Commonly known as:  CARAFATE  TAKE 10 MLS (1 G TOTAL) BY MOUTH 4 (FOUR) TIMES DAILY - WITH MEALS AND AT BEDTIME.     Vitamin D3 2000 UNITS  Tabs  Take 1 tablet by mouth daily.         PHYSICAL EXAMINATION  Oncology Vitals 06/12/2015 06/07/2015 06/01/2015 05/25/2015 05/23/2015 05/23/2015 02/06/2015  Height 155 cm 155 cm 155 cm - - 155 cm 155 cm  Weight 34.383 kg 34.836 kg 35.108 kg - - 35.244 kg 41.051 kg  Weight (lbs) 75 lbs 13 oz 76 lbs 13 oz 77 lbs 6 oz - - 77 lbs 11 oz 90 lbs 8 oz  BMI (kg/m2) 14.32 kg/m2 14.51 kg/m2 14.62 kg/m2 - - 14.68 kg/m2 17.1 kg/m2  Temp - 97.7 97.7 97 - 97.9 97.7  Pulse 64 60 69 65 78 66 62  Resp 17 18 20 18 18 18  18  SpO2 100 100 100 100 100 99 100  BSA (m2) 1.22 m2 1.22 m2 1.23 m2 - - 1.23 m2 1.33 m2   BP Readings from Last 3 Encounters:  06/12/15 113/71  06/07/15 92/55  06/01/15 118/76    Physical Exam  Constitutional: She is oriented to person, place, and time. Vital signs are normal. She appears malnourished and dehydrated. She appears cachectic.  Patient appears fatigued, slightly weak, and extremely thin.  HENT:  Head: Normocephalic and atraumatic.  Mouth/Throat: Oropharynx is clear and moist.  Eyes: Conjunctivae and EOM are normal. Pupils are equal, round, and reactive to light. Right eye exhibits no discharge. Left eye exhibits no discharge. No scleral icterus.  Neck: Normal range of motion. Neck supple. No JVD present. No tracheal deviation present. No thyromegaly present.  Cardiovascular: Normal rate, regular rhythm, normal heart sounds and intact distal pulses.   Pulmonary/Chest: Effort normal and breath sounds normal. No respiratory distress. She has no wheezes. She has no rales. She exhibits no tenderness.  Abdominal: Soft. Bowel sounds are normal. She exhibits mass. She exhibits no distension. There is no tenderness. There is no rebound and no guarding.  Patient has a firm nodule to the central abdomen that is chronic for her.  This area is nontender.  Musculoskeletal: Normal range of motion. She exhibits no edema or tenderness.  Lymphadenopathy:    She has no cervical  adenopathy.  Neurological: She is alert and oriented to person, place, and time. Gait normal.  Skin: Skin is warm and dry. No rash noted. No erythema. No pallor.  Psychiatric: Affect normal.    LABORATORY DATA:. No visits with results within 3 Day(s) from this visit. Latest known visit with results is:  Appointment on 06/01/2015  Component Date Value Ref Range Status  . WBC 06/01/2015 4.6  3.9 - 10.3 10e3/uL Final  . NEUT# 06/01/2015 2.8  1.5 - 6.5 10e3/uL Final  . HGB 06/01/2015 14.2  11.6 - 15.9 g/dL Final  . HCT 06/01/2015 42.9  34.8 - 46.6 % Final  . Platelets 06/01/2015 257  145 - 400 10e3/uL Final  . MCV 06/01/2015 88.3  79.5 - 101.0 fL Final  . MCH 06/01/2015 29.2  25.1 - 34.0 pg Final  . MCHC 06/01/2015 33.0  31.5 - 36.0 g/dL Final  . RBC 06/01/2015 4.85  3.70 - 5.45 10e6/uL Final  . RDW 06/01/2015 13.6  11.2 - 14.5 % Final  . lymph# 06/01/2015 1.3  0.9 - 3.3 10e3/uL Final  . MONO# 06/01/2015 0.4  0.1 - 0.9 10e3/uL Final  . Eosinophils Absolute 06/01/2015 0.1  0.0 - 0.5 10e3/uL Final  . Basophils Absolute 06/01/2015 0.0  0.0 - 0.1 10e3/uL Final  . NEUT% 06/01/2015 61.5  38.4 - 76.8 % Final  . LYMPH% 06/01/2015 28.0  14.0 - 49.7 % Final  . MONO% 06/01/2015 8.5  0.0 - 14.0 % Final  . EOS% 06/01/2015 1.5  0.0 - 7.0 % Final  . BASO% 06/01/2015 0.5  0.0 - 2.0 % Final  . Sodium 06/01/2015 140  136 - 145 mEq/L Final  . Potassium 06/01/2015 3.3* 3.5 - 5.1 mEq/L Final  . Chloride 06/01/2015 106  98 - 109 mEq/L Final  . CO2 06/01/2015 25  22 - 29 mEq/L Final  . Glucose 06/01/2015 84  70 - 140 mg/dl Final   Glucose reference range is for nonfasting patients. Fasting glucose reference range is 70- 100.  Marland Kitchen BUN 06/01/2015 19.7  7.0 - 26.0 mg/dL Final  . Creatinine  06/01/2015 0.8  0.6 - 1.1 mg/dL Final  . Total Bilirubin 06/01/2015 0.69  0.20 - 1.20 mg/dL Final  . Alkaline Phosphatase 06/01/2015 34* 40 - 150 U/L Final  . AST 06/01/2015 18  5 - 34 U/L Final  . ALT 06/01/2015 13  0 -  55 U/L Final  . Total Protein 06/01/2015 6.2* 6.4 - 8.3 g/dL Final  . Albumin 06/01/2015 3.4* 3.5 - 5.0 g/dL Final  . Calcium 06/01/2015 8.8  8.4 - 10.4 mg/dL Final  . Anion Gap 06/01/2015 8  3 - 11 mEq/L Final  . EGFR 06/01/2015 73* >90 ml/min/1.73 m2 Final   eGFR is calculated using the CKD-EPI Creatinine Equation (2009)     RADIOGRAPHIC STUDIES: No results found.  ASSESSMENT/PLAN:    Gastric cancer Patient received her last Folfox chemotherapy on 06/19/2014.   Patient  Has been complaining of progressive abdominal discomfort for the past 6 weeks.  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   patient continues to complain of minimal appetite and poor oral intake.  She also complains of chronic nausea but only occasional vomiting.  She continues to feel dehydrated today; but refuses IV fluid rehydration.    patient has refused a PICC line or the initiation of TPN. However , patient is scheduled 2 obtain a jejunostomy tube for feedings this coming Friday  November 25th 2016 by Dr. Barry Dienes.  General Psychologist, sport and exercise.   Also, patient is requesting a hospice referral and confirm that she wishes to become a DO NOT RESUSCITATE as well.   Hospice referral will be placed today.   Patient will return to the [facility] for a follow-up visit only on 06/28/2015.  Nausea with vomiting  Patient continues with some chronic nausea; but only occasional vomiting. Patient stated that she does already have nausea medications at home to use on an as-needed basis. Most likely, chronic nausea is secondary to progression of disease.  Dehydration  Patient continues with chronic nausea, minimal appetite, weight loss, and subsequent dehydration.  However, patient  Refuses IV fluid rehydration today.    patient has plans to obtain a jejunostomy tube for  tube feedings this coming Friday 05/27/2015.  She was also encouraged to push fluids at home is much as possible.  Abdominal pain  Patient complains of chronic /progressive generalized abdominal discomfort for the past 6 weeks or so. She has developed some mild ascites; and recent paracentesis was obtained. Cytology of fluid obtained via paracentesis revealed progression of disease.    patient has requested a hospice referral for further evaluation and management ;  And has made the decision to become a DO NOT RESUSCITATE as well.   Confirmed the patient does have pain medication to take at home if required.  Weight loss  Patient has chronic nausea, minimal appetite, poor oral intake, and dehydration.  She continues to lose weight.   Most likely, these symptoms are secondary to recent progression of disease.   Patient is scheduled to undergo a jejunostomy tube placement this coming Friday 06/14/2015.   Patient stated understanding of all instructions; and was in agreement with this plan of care. The patient knows to call the clinic with any problems, questions or concerns.   This was a shared visit with Dr. Benay Spice today.  Total time spent with patient was 40 minutes;  with greater than 75 percent of that time spent in face  to face counseling regarding patient's symptoms,  and coordination of care and follow up.  Disclaimer:This dictation was prepared with Dragon/digital dictation along with [employer]. Any transcriptional errors that result from this process are unintentional.  Drue Second, NP 06/12/2015   This was a shared visit with Drue Second. Ms. Polzin requested a Hospice referral. She will be placed on a No Code Blue status.  She will have a feeding tube placed later this week.  Julieanne Manson, MD

## 2015-06-12 NOTE — Assessment & Plan Note (Signed)
Patient continues with some chronic nausea; but only occasional vomiting. Patient stated that she does already have nausea medications at home to use on an as-needed basis. Most likely, chronic nausea is secondary to progression of disease.

## 2015-06-12 NOTE — Assessment & Plan Note (Signed)
Patient continues with chronic nausea, minimal appetite, weight loss, and subsequent dehydration.  However, patient  Refuses IV fluid rehydration today.    patient has plans to obtain a jejunostomy tube for tube feedings this coming Friday 06/10/2015.  She was also encouraged to push fluids at home is much as possible.

## 2015-06-13 ENCOUNTER — Other Ambulatory Visit: Payer: Self-pay | Admitting: *Deleted

## 2015-06-13 ENCOUNTER — Encounter (HOSPITAL_COMMUNITY)
Admission: RE | Admit: 2015-06-13 | Discharge: 2015-06-13 | Disposition: A | Discharge status: Home / Self Care | Payer: Medicare Other | Source: Ambulatory Visit | Attending: General Surgery | Admitting: General Surgery

## 2015-06-13 ENCOUNTER — Ambulatory Visit: Payer: Medicare Other | Admitting: Nutrition

## 2015-06-13 ENCOUNTER — Encounter (HOSPITAL_COMMUNITY): Payer: Self-pay

## 2015-06-13 DIAGNOSIS — C169 Malignant neoplasm of stomach, unspecified: Secondary | ICD-10-CM

## 2015-06-13 LAB — COMPREHENSIVE METABOLIC PANEL
ALK PHOS: 29 U/L — AB (ref 38–126)
ALT: 12 U/L — AB (ref 14–54)
AST: 20 U/L (ref 15–41)
Albumin: 3.4 g/dL — ABNORMAL LOW (ref 3.5–5.0)
Anion gap: 4 — ABNORMAL LOW (ref 5–15)
BUN: 20 mg/dL (ref 6–20)
CALCIUM: 8.8 mg/dL — AB (ref 8.9–10.3)
CO2: 29 mmol/L (ref 22–32)
CREATININE: 0.91 mg/dL (ref 0.44–1.00)
Chloride: 107 mmol/L (ref 101–111)
Glucose, Bld: 86 mg/dL (ref 65–99)
Potassium: 4.8 mmol/L (ref 3.5–5.1)
Sodium: 140 mmol/L (ref 135–145)
TOTAL PROTEIN: 5.7 g/dL — AB (ref 6.5–8.1)
Total Bilirubin: 1 mg/dL (ref 0.3–1.2)

## 2015-06-13 LAB — TYPE AND SCREEN
ABO/RH(D): A POS
Antibody Screen: NEGATIVE

## 2015-06-13 LAB — URINALYSIS, ROUTINE W REFLEX MICROSCOPIC
Bilirubin Urine: NEGATIVE
GLUCOSE, UA: NEGATIVE mg/dL
Hgb urine dipstick: NEGATIVE
Ketones, ur: NEGATIVE mg/dL
LEUKOCYTES UA: NEGATIVE
Nitrite: NEGATIVE
PH: 6 (ref 5.0–8.0)
Protein, ur: NEGATIVE mg/dL
Specific Gravity, Urine: 1.024 (ref 1.005–1.030)

## 2015-06-13 LAB — CBC WITH DIFFERENTIAL/PLATELET
Basophils Absolute: 0 10*3/uL (ref 0.0–0.1)
Basophils Relative: 0 %
EOS PCT: 1 %
Eosinophils Absolute: 0 10*3/uL (ref 0.0–0.7)
HCT: 40 % (ref 36.0–46.0)
Hemoglobin: 13 g/dL (ref 12.0–15.0)
LYMPHS ABS: 1 10*3/uL (ref 0.7–4.0)
LYMPHS PCT: 28 %
MCH: 28.6 pg (ref 26.0–34.0)
MCHC: 32.5 g/dL (ref 30.0–36.0)
MCV: 88.1 fL (ref 78.0–100.0)
Monocytes Absolute: 0.3 10*3/uL (ref 0.1–1.0)
Monocytes Relative: 7 %
NEUTROS PCT: 64 %
Neutro Abs: 2.2 10*3/uL (ref 1.7–7.7)
PLATELETS: 225 10*3/uL (ref 150–400)
RBC: 4.54 MIL/uL (ref 3.87–5.11)
RDW: 13.4 % (ref 11.5–15.5)
WBC: 3.5 10*3/uL — ABNORMAL LOW (ref 4.0–10.5)

## 2015-06-13 LAB — ABO/RH: ABO/RH(D): A POS

## 2015-06-13 LAB — PROTIME-INR
INR: 1.08 (ref 0.00–1.49)
PROTHROMBIN TIME: 14.2 s (ref 11.6–15.2)

## 2015-06-13 MED ORDER — OSMOLITE 1 CAL PO LIQD: ORAL | Status: AC

## 2015-06-13 NOTE — Anesthesia Preprocedure Evaluation (Addendum)
Anesthesia Evaluation  Patient identified by MRN, date of birth, ID band Patient awake    Reviewed: Allergy & Precautions, NPO status , Patient's Chart, lab work & pertinent test results  History of Anesthesia Complications (+) PONV and history of anesthetic complications  Airway Mallampati: II  TM Distance: >3 FB Neck ROM: Full    Dental no notable dental hx. (+) Dental Advisory Given   Pulmonary neg pulmonary ROS,    Pulmonary exam normal breath sounds clear to auscultation       Cardiovascular negative cardio ROS Normal cardiovascular exam Rhythm:Regular Rate:Normal     Neuro/Psych  Headaches, PSYCHIATRIC DISORDERS Anxiety Depression    GI/Hepatic Neg liver ROS, GERD  Medicated and Controlled,Gastric cancer   Endo/Other  negative endocrine ROS  Renal/GU negative Renal ROS  negative genitourinary   Musculoskeletal  (+) Arthritis ,   Abdominal   Peds negative pediatric ROS (+)  Hematology negative hematology ROS (+)   Anesthesia Other Findings   Reproductive/Obstetrics negative OB ROS                            Anesthesia Physical Anesthesia Plan  ASA: III  Anesthesia Plan: General   Post-op Pain Management:    Induction: Intravenous  Airway Management Planned: Oral ETT  Additional Equipment:   Intra-op Plan:   Post-operative Plan: Extubation in OR  Informed Consent: I have reviewed the patients History and Physical, chart, labs and discussed the procedure including the risks, benefits and alternatives for the proposed anesthesia with the patient or authorized representative who has indicated his/her understanding and acceptance.   Dental advisory given  Plan Discussed with: CRNA  Anesthesia Plan Comments:         Anesthesia Quick Evaluation

## 2015-06-13 NOTE — Progress Notes (Signed)
Oncology Nurse Navigator Documentation  Oncology Nurse Navigator Flowsheets 06/13/2015  Navigator Encounter Type Other  Patient Visit Type -  Treatment Phase -  Barriers/Navigation Needs -  Interventions Coordination of Care  Coordination of Care Other--ordered labs for nutrition to monitor for refeeding syndrome  Time Spent with Patient -  Starts J-tube feedings on Saturday-arrangements made by Ernestene Kiel.

## 2015-06-13 NOTE — Progress Notes (Signed)
Lynn Morris 74 y.o. female diagnosed with stomach cancer. Patient is status post chemotherapy completed November 2015. Patient is currently undergoing observation only. Patient received her last Folfox chemotherapy on 06/19/2014. Patienthas been complaining of progressive abdominal discomfort for the past 6 weeks. Recent paracentesis secondary to ascites reveal disease progression.  Met with patient today who is scheduled for a surgical jejunostomy feeding tube on Friday. Patient has severe malnutrition.  Her weight is 75 pounds on November 23. Patient unable to eat much and has had vomiting after oral intake. Patient is at risk for refeeding syndrome.  Initial Estimated nutrition needs: 1200 cal, 50-60 grams protein, 1.2 L fluid.  Nutrition diagnosis:  Unintended weight loss has continued.  Intervention: Recommend Osmolite 1.0-calorie formula at 20 mL an hour over 24 hours with 60 cc free water flush before and after connection I expect tube feedings to begin on Saturday, November 26.  Patient should run continuous feedings at 20 mL an hour both Saturday and Sunday. If tolerated, patient can increase continuous feeding to 30 cc/hr on Monday, November 28. Recommend Cmet, phosphorus and magnesium on Monday and Thursday next week.   Will evaluate labs and determine if rate of tube feeding can then be increased.  Patient was provided with sample formula Osmolite 1.0 for home usage. Patient and husband were educated on tube feeding administration. Orders were written for pump and nursing care to be delivered after discharge.   Questions were answered.  Teach back method used.  Monitoring, evaluation, goals: Patient will tolerate increased calories and protein to promote repletion of lean body mass.    Next visit: Will follow-up with patient either in person or on telephone on Monday, November 28.  **Disclaimer: This note was dictated with voice recognition software. Similar sounding  words can inadvertently be transcribed and this note may contain transcription errors which may not have been corrected upon publication of note.**

## 2015-06-15 ENCOUNTER — Inpatient Hospital Stay (HOSPITAL_COMMUNITY): Payer: Medicare Other | Admitting: Anesthesiology

## 2015-06-15 ENCOUNTER — Encounter (HOSPITAL_COMMUNITY): Payer: Self-pay | Admitting: *Deleted

## 2015-06-15 ENCOUNTER — Encounter (HOSPITAL_COMMUNITY): Admission: RE | Disposition: E | Payer: Self-pay | Source: Ambulatory Visit | Attending: General Surgery

## 2015-06-15 ENCOUNTER — Inpatient Hospital Stay (HOSPITAL_COMMUNITY)
Admission: RE | Admit: 2015-06-15 | Discharge: 2015-07-22 | DRG: 356 | Disposition: E | Payer: Medicare Other | Source: Ambulatory Visit | Attending: General Surgery | Admitting: General Surgery

## 2015-06-15 DIAGNOSIS — E43 Unspecified severe protein-calorie malnutrition: Secondary | ICD-10-CM | POA: Diagnosis present

## 2015-06-15 DIAGNOSIS — Z8249 Family history of ischemic heart disease and other diseases of the circulatory system: Secondary | ICD-10-CM | POA: Diagnosis not present

## 2015-06-15 DIAGNOSIS — Z800 Family history of malignant neoplasm of digestive organs: Secondary | ICD-10-CM

## 2015-06-15 DIAGNOSIS — M1990 Unspecified osteoarthritis, unspecified site: Secondary | ICD-10-CM | POA: Diagnosis present

## 2015-06-15 DIAGNOSIS — Z885 Allergy status to narcotic agent status: Secondary | ICD-10-CM | POA: Diagnosis not present

## 2015-06-15 DIAGNOSIS — F600 Paranoid personality disorder: Secondary | ICD-10-CM | POA: Diagnosis present

## 2015-06-15 DIAGNOSIS — Z833 Family history of diabetes mellitus: Secondary | ICD-10-CM

## 2015-06-15 DIAGNOSIS — K559 Vascular disorder of intestine, unspecified: Secondary | ICD-10-CM | POA: Diagnosis present

## 2015-06-15 DIAGNOSIS — D72819 Decreased white blood cell count, unspecified: Secondary | ICD-10-CM | POA: Diagnosis not present

## 2015-06-15 DIAGNOSIS — I959 Hypotension, unspecified: Secondary | ICD-10-CM | POA: Diagnosis present

## 2015-06-15 DIAGNOSIS — K5900 Constipation, unspecified: Secondary | ICD-10-CM | POA: Diagnosis present

## 2015-06-15 DIAGNOSIS — Z8701 Personal history of pneumonia (recurrent): Secondary | ICD-10-CM | POA: Diagnosis not present

## 2015-06-15 DIAGNOSIS — Z681 Body mass index (BMI) 19 or less, adult: Secondary | ICD-10-CM | POA: Diagnosis not present

## 2015-06-15 DIAGNOSIS — R188 Other ascites: Secondary | ICD-10-CM | POA: Diagnosis present

## 2015-06-15 DIAGNOSIS — K222 Esophageal obstruction: Secondary | ICD-10-CM | POA: Diagnosis present

## 2015-06-15 DIAGNOSIS — K55039 Acute (reversible) ischemia of large intestine, extent unspecified: Secondary | ICD-10-CM | POA: Diagnosis present

## 2015-06-15 DIAGNOSIS — R627 Adult failure to thrive: Secondary | ICD-10-CM | POA: Diagnosis present

## 2015-06-15 DIAGNOSIS — E46 Unspecified protein-calorie malnutrition: Secondary | ICD-10-CM | POA: Diagnosis present

## 2015-06-15 DIAGNOSIS — R410 Disorientation, unspecified: Secondary | ICD-10-CM | POA: Diagnosis not present

## 2015-06-15 DIAGNOSIS — Z01812 Encounter for preprocedural laboratory examination: Secondary | ICD-10-CM

## 2015-06-15 DIAGNOSIS — F419 Anxiety disorder, unspecified: Secondary | ICD-10-CM | POA: Diagnosis present

## 2015-06-15 DIAGNOSIS — Z79899 Other long term (current) drug therapy: Secondary | ICD-10-CM | POA: Diagnosis not present

## 2015-06-15 DIAGNOSIS — Z72820 Sleep deprivation: Secondary | ICD-10-CM

## 2015-06-15 DIAGNOSIS — C160 Malignant neoplasm of cardia: Principal | ICD-10-CM | POA: Diagnosis present

## 2015-06-15 DIAGNOSIS — Z90710 Acquired absence of both cervix and uterus: Secondary | ICD-10-CM | POA: Diagnosis not present

## 2015-06-15 DIAGNOSIS — E871 Hypo-osmolality and hyponatremia: Secondary | ICD-10-CM | POA: Diagnosis not present

## 2015-06-15 DIAGNOSIS — Z85028 Personal history of other malignant neoplasm of stomach: Secondary | ICD-10-CM

## 2015-06-15 DIAGNOSIS — E785 Hyperlipidemia, unspecified: Secondary | ICD-10-CM | POA: Diagnosis present

## 2015-06-15 DIAGNOSIS — C169 Malignant neoplasm of stomach, unspecified: Secondary | ICD-10-CM

## 2015-06-15 DIAGNOSIS — R112 Nausea with vomiting, unspecified: Secondary | ICD-10-CM | POA: Diagnosis present

## 2015-06-15 DIAGNOSIS — K219 Gastro-esophageal reflux disease without esophagitis: Secondary | ICD-10-CM | POA: Diagnosis present

## 2015-06-15 DIAGNOSIS — Z66 Do not resuscitate: Secondary | ICD-10-CM | POA: Diagnosis present

## 2015-06-15 DIAGNOSIS — Z515 Encounter for palliative care: Secondary | ICD-10-CM | POA: Diagnosis present

## 2015-06-15 DIAGNOSIS — R109 Unspecified abdominal pain: Secondary | ICD-10-CM

## 2015-06-15 DIAGNOSIS — F329 Major depressive disorder, single episode, unspecified: Secondary | ICD-10-CM | POA: Diagnosis present

## 2015-06-15 DIAGNOSIS — R1110 Vomiting, unspecified: Secondary | ICD-10-CM

## 2015-06-15 HISTORY — PX: LAPAROSCOPY: SHX197

## 2015-06-15 HISTORY — PX: JEJUNOSTOMY: SHX313

## 2015-06-15 SURGERY — CREATION, JEJUNOSTOMY
Anesthesia: General

## 2015-06-15 MED ORDER — CEFAZOLIN SODIUM-DEXTROSE 2-3 GM-% IV SOLR
2.0000 g | INTRAVENOUS | Status: AC
  Administered 2015-06-15: 2 g via INTRAVENOUS

## 2015-06-15 MED ORDER — DEXAMETHASONE SODIUM PHOSPHATE 4 MG/ML IJ SOLN
INTRAMUSCULAR | Status: DC | PRN
  Administered 2015-06-15: 10 mg via INTRAVENOUS

## 2015-06-15 MED ORDER — SERTRALINE HCL 100 MG PO TABS
100.0000 mg | ORAL_TABLET | ORAL | Status: DC
  Administered 2015-06-16 – 2015-06-18 (×2): 100 mg via ORAL
  Filled 2015-06-15 (×3): qty 1

## 2015-06-15 MED ORDER — DIPHENHYDRAMINE HCL 12.5 MG/5ML PO ELIX: 12.5000 mg | ORAL_SOLUTION | Freq: Four times a day (QID) | ORAL | Status: DC | PRN

## 2015-06-15 MED ORDER — HYDROMORPHONE 1 MG/ML IV SOLN
INTRAVENOUS | Status: DC
  Administered 2015-06-15: 0.6 mg via INTRAVENOUS
  Administered 2015-06-15: 0.2 mg via INTRAVENOUS
  Administered 2015-06-15: 1.8 mg via INTRAVENOUS
  Administered 2015-06-15: 10:00:00 via INTRAVENOUS
  Administered 2015-06-16: 0.6 mg via INTRAVENOUS
  Administered 2015-06-16: 1.2 mg via INTRAVENOUS
  Administered 2015-06-16: 0.8 mg via INTRAVENOUS
  Administered 2015-06-16: 0 mg via INTRAVENOUS
  Administered 2015-06-16: 0.6 mg via INTRAVENOUS
  Administered 2015-06-16: 0.8 mg via INTRAVENOUS
  Administered 2015-06-17: 0.6 mg via INTRAVENOUS
  Administered 2015-06-17: 0.2 mg via INTRAVENOUS

## 2015-06-15 MED ORDER — ONDANSETRON HCL 4 MG/2ML IJ SOLN: 4.0000 mg | Freq: Four times a day (QID) | INTRAMUSCULAR | Status: DC | PRN

## 2015-06-15 MED ORDER — DEXAMETHASONE SODIUM PHOSPHATE 10 MG/ML IJ SOLN
INTRAMUSCULAR | Status: AC
  Filled 2015-06-15: qty 1

## 2015-06-15 MED ORDER — LIDOCAINE HCL (CARDIAC) 20 MG/ML IV SOLN
INTRAVENOUS | Status: AC
  Filled 2015-06-15: qty 5

## 2015-06-15 MED ORDER — ROCURONIUM BROMIDE 100 MG/10ML IV SOLN
INTRAVENOUS | Status: DC | PRN
  Administered 2015-06-15: 10 mg via INTRAVENOUS

## 2015-06-15 MED ORDER — TISSEEL VH 10 ML EX KIT
PACK | CUTANEOUS | Status: AC
  Filled 2015-06-15: qty 1

## 2015-06-15 MED ORDER — ONDANSETRON HCL 4 MG/2ML IJ SOLN
INTRAMUSCULAR | Status: DC | PRN
  Administered 2015-06-15: 4 mg via INTRAVENOUS

## 2015-06-15 MED ORDER — PROPOFOL 10 MG/ML IV BOLUS
INTRAVENOUS | Status: AC
  Filled 2015-06-15: qty 20

## 2015-06-15 MED ORDER — HYDROMORPHONE 1 MG/ML IV SOLN
INTRAVENOUS | Status: AC
  Filled 2015-06-15: qty 25

## 2015-06-15 MED ORDER — SUGAMMADEX SODIUM 200 MG/2ML IV SOLN
INTRAVENOUS | Status: AC
  Filled 2015-06-15: qty 2

## 2015-06-15 MED ORDER — LACTATED RINGERS IR SOLN
Status: DC | PRN
  Administered 2015-06-15: 1000 mL

## 2015-06-15 MED ORDER — SUCRALFATE 1 GM/10ML PO SUSP
1.0000 g | Freq: Three times a day (TID) | ORAL | Status: DC
  Administered 2015-06-16 – 2015-06-20 (×15): 1 g via ORAL
  Filled 2015-06-15 (×23): qty 10

## 2015-06-15 MED ORDER — FENTANYL CITRATE (PF) 100 MCG/2ML IJ SOLN
INTRAMUSCULAR | Status: AC
  Filled 2015-06-15: qty 2

## 2015-06-15 MED ORDER — MIDAZOLAM HCL 5 MG/5ML IJ SOLN
INTRAMUSCULAR | Status: DC | PRN
  Administered 2015-06-15: 0.5 mg via INTRAVENOUS

## 2015-06-15 MED ORDER — SUGAMMADEX SODIUM 200 MG/2ML IV SOLN
INTRAVENOUS | Status: DC | PRN
  Administered 2015-06-15: 100 mg via INTRAVENOUS

## 2015-06-15 MED ORDER — PANTOPRAZOLE SODIUM 40 MG IV SOLR
40.0000 mg | Freq: Every day | INTRAVENOUS | Status: DC
  Administered 2015-06-15 – 2015-06-20 (×5): 40 mg via INTRAVENOUS
  Filled 2015-06-15 (×7): qty 40

## 2015-06-15 MED ORDER — LIDOCAINE HCL 1 % IJ SOLN
INTRAMUSCULAR | Status: AC
  Filled 2015-06-15: qty 20

## 2015-06-15 MED ORDER — CEFAZOLIN SODIUM-DEXTROSE 2-3 GM-% IV SOLR
INTRAVENOUS | Status: AC
  Filled 2015-06-15: qty 50

## 2015-06-15 MED ORDER — BISACODYL 5 MG PO TBEC: 5.0000 mg | DELAYED_RELEASE_TABLET | Freq: Every day | ORAL | Status: DC | PRN

## 2015-06-15 MED ORDER — BUPIVACAINE-EPINEPHRINE (PF) 0.25% -1:200000 IJ SOLN
INTRAMUSCULAR | Status: AC
  Filled 2015-06-15: qty 30

## 2015-06-15 MED ORDER — METOCLOPRAMIDE HCL 5 MG/ML IJ SOLN
INTRAMUSCULAR | Status: AC
  Filled 2015-06-15: qty 2

## 2015-06-15 MED ORDER — ACETAMINOPHEN 650 MG RE SUPP: 650.0000 mg | Freq: Four times a day (QID) | RECTAL | Status: DC | PRN

## 2015-06-15 MED ORDER — METOCLOPRAMIDE HCL 5 MG/ML IJ SOLN
INTRAMUSCULAR | Status: DC | PRN
  Administered 2015-06-15: 10 mg via INTRAVENOUS

## 2015-06-15 MED ORDER — CEFAZOLIN SODIUM-DEXTROSE 2-3 GM-% IV SOLR
2.0000 g | Freq: Three times a day (TID) | INTRAVENOUS | Status: AC
  Administered 2015-06-15: 2 g via INTRAVENOUS
  Filled 2015-06-15: qty 50

## 2015-06-15 MED ORDER — SIMETHICONE 80 MG PO CHEW
40.0000 mg | CHEWABLE_TABLET | Freq: Four times a day (QID) | ORAL | Status: DC | PRN
  Filled 2015-06-15: qty 1

## 2015-06-15 MED ORDER — KCL IN DEXTROSE-NACL 10-5-0.45 MEQ/L-%-% IV SOLN
INTRAVENOUS | Status: DC
  Administered 2015-06-15 – 2015-06-19 (×5): via INTRAVENOUS
  Filled 2015-06-15 (×9): qty 1000

## 2015-06-15 MED ORDER — FENTANYL CITRATE (PF) 100 MCG/2ML IJ SOLN
INTRAMUSCULAR | Status: DC | PRN
  Administered 2015-06-15 (×2): 50 ug via INTRAVENOUS

## 2015-06-15 MED ORDER — ONDANSETRON 4 MG PO TBDP: 4.0000 mg | ORAL_TABLET | Freq: Four times a day (QID) | ORAL | Status: DC | PRN

## 2015-06-15 MED ORDER — METHOCARBAMOL 500 MG PO TABS
500.0000 mg | ORAL_TABLET | Freq: Four times a day (QID) | ORAL | Status: DC | PRN
  Administered 2015-06-18: 500 mg via ORAL
  Filled 2015-06-15: qty 1

## 2015-06-15 MED ORDER — HYDROMORPHONE HCL 1 MG/ML IJ SOLN
INTRAMUSCULAR | Status: AC
  Filled 2015-06-15: qty 1

## 2015-06-15 MED ORDER — DIPHENHYDRAMINE HCL 50 MG/ML IJ SOLN: 12.5000 mg | Freq: Four times a day (QID) | INTRAMUSCULAR | Status: DC | PRN

## 2015-06-15 MED ORDER — OXYCODONE HCL 5 MG PO TABS
5.0000 mg | ORAL_TABLET | ORAL | Status: DC | PRN
  Administered 2015-06-17: 5 mg via ORAL
  Administered 2015-06-18 – 2015-06-20 (×4): 10 mg via ORAL
  Filled 2015-06-15 (×2): qty 2
  Filled 2015-06-15: qty 1
  Filled 2015-06-15 (×2): qty 2

## 2015-06-15 MED ORDER — POTASSIUM CHLORIDE CRYS ER 20 MEQ PO TBCR
20.0000 meq | EXTENDED_RELEASE_TABLET | Freq: Every day | ORAL | Status: DC
  Administered 2015-06-17 – 2015-06-19 (×3): 20 meq via ORAL
  Filled 2015-06-15 (×5): qty 1

## 2015-06-15 MED ORDER — SUCCINYLCHOLINE CHLORIDE 20 MG/ML IJ SOLN
INTRAMUSCULAR | Status: DC | PRN
  Administered 2015-06-15: 100 mg via INTRAVENOUS

## 2015-06-15 MED ORDER — SODIUM CHLORIDE 0.9 % IJ SOLN: 9.0000 mL | INTRAMUSCULAR | Status: DC | PRN

## 2015-06-15 MED ORDER — MIDAZOLAM HCL 2 MG/2ML IJ SOLN
INTRAMUSCULAR | Status: AC
  Filled 2015-06-15: qty 2

## 2015-06-15 MED ORDER — EPHEDRINE SULFATE 50 MG/ML IJ SOLN
INTRAMUSCULAR | Status: AC
  Filled 2015-06-15: qty 1

## 2015-06-15 MED ORDER — SODIUM CHLORIDE 0.9 % IJ SOLN
INTRAMUSCULAR | Status: AC
  Filled 2015-06-15: qty 10

## 2015-06-15 MED ORDER — BUPIVACAINE LIPOSOME 1.3 % IJ SUSP
20.0000 mL | Freq: Once | INTRAMUSCULAR | Status: AC
  Administered 2015-06-15: 20 mL
  Filled 2015-06-15: qty 20

## 2015-06-15 MED ORDER — ONDANSETRON HCL 4 MG/2ML IJ SOLN
INTRAMUSCULAR | Status: AC
  Filled 2015-06-15: qty 2

## 2015-06-15 MED ORDER — LIDOCAINE HCL (CARDIAC) 20 MG/ML IV SOLN
INTRAVENOUS | Status: DC | PRN
  Administered 2015-06-15: 40 mg via INTRAVENOUS

## 2015-06-15 MED ORDER — PROPOFOL 10 MG/ML IV BOLUS
INTRAVENOUS | Status: DC | PRN
  Administered 2015-06-15: 90 mg via INTRAVENOUS

## 2015-06-15 MED ORDER — NALOXONE HCL 0.4 MG/ML IJ SOLN: 0.4000 mg | INTRAMUSCULAR | Status: DC | PRN

## 2015-06-15 MED ORDER — LACTATED RINGERS IV SOLN
INTRAVENOUS | Status: DC | PRN
  Administered 2015-06-15 (×2): via INTRAVENOUS

## 2015-06-15 MED ORDER — ACETAMINOPHEN 325 MG PO TABS: 650.0000 mg | ORAL_TABLET | Freq: Four times a day (QID) | ORAL | Status: DC | PRN

## 2015-06-15 MED ORDER — HYDROMORPHONE HCL 1 MG/ML IJ SOLN
1.0000 mg | INTRAMUSCULAR | Status: DC | PRN
  Administered 2015-06-17: 1 mg via INTRAVENOUS
  Administered 2015-06-19 (×2): 2 mg via INTRAVENOUS
  Filled 2015-06-15: qty 1
  Filled 2015-06-15 (×3): qty 2
  Filled 2015-06-15: qty 1

## 2015-06-15 SURGICAL SUPPLY — 85 items
APPLIER CLIP 5 13 M/L LIGAMAX5 (MISCELLANEOUS)
APPLIER CLIP ROT 10 11.4 M/L (STAPLE)
BAG URINE DRAINAGE (UROLOGICAL SUPPLIES) IMPLANT
BENZOIN TINCTURE PRP APPL 2/3 (GAUZE/BANDAGES/DRESSINGS) ×2 IMPLANT
BLADE EXTENDED COATED 6.5IN (ELECTRODE) IMPLANT
CABLE HIGH FREQUENCY MONO STRZ (ELECTRODE) ×2 IMPLANT
CATH FOLEY 2WAY SLVR  5CC 16FR (CATHETERS)
CATH FOLEY 2WAY SLVR  5CC 18FR (CATHETERS)
CATH FOLEY 2WAY SLVR 5CC 16FR (CATHETERS) IMPLANT
CATH FOLEY 2WAY SLVR 5CC 18FR (CATHETERS) IMPLANT
CATH ROBINSON RED A/P 16FR (CATHETERS) ×2 IMPLANT
CHLORAPREP W/TINT 26ML (MISCELLANEOUS) IMPLANT
CLAMP ENDO BABCK 10MM (STAPLE) IMPLANT
CLIP APPLIE 5 13 M/L LIGAMAX5 (MISCELLANEOUS) IMPLANT
CLIP APPLIE ROT 10 11.4 M/L (STAPLE) IMPLANT
COVER SURGICAL LIGHT HANDLE (MISCELLANEOUS) ×2 IMPLANT
DECANTER SPIKE VIAL GLASS SM (MISCELLANEOUS) IMPLANT
DEVICE PMI PUNCTURE CLOSURE (MISCELLANEOUS) IMPLANT
DEVICE TROCAR PUNCTURE CLOSURE (ENDOMECHANICALS) IMPLANT
DISSECTOR ROUND CHERRY 3/8 STR (MISCELLANEOUS) IMPLANT
DRAIN CHANNEL RND F F (WOUND CARE) IMPLANT
DRAPE CAMERA CLOSED 9X96 (DRAPES) IMPLANT
DRAPE SHEET LG 3/4 BI-LAMINATE (DRAPES) IMPLANT
DRESSING TELFA ISLAND 4X8 (GAUZE/BANDAGES/DRESSINGS) IMPLANT
DRSG MEPILEX BORDER 4X8 (GAUZE/BANDAGES/DRESSINGS) IMPLANT
DRSG TEGADERM 2-3/8X2-3/4 SM (GAUZE/BANDAGES/DRESSINGS) ×4 IMPLANT
DRSG TELFA 3X8 NADH (GAUZE/BANDAGES/DRESSINGS) IMPLANT
DRSG TELFA 4X10 ISLAND STR (GAUZE/BANDAGES/DRESSINGS) IMPLANT
DRSG TELFA PLUS 4X6 ADH ISLAND (GAUZE/BANDAGES/DRESSINGS) ×2 IMPLANT
ELECT PENCIL ROCKER SW 15FT (MISCELLANEOUS) ×4 IMPLANT
EVACUATOR SILICONE 100CC (DRAIN) IMPLANT
GAUZE SPONGE 2X2 8PLY STRL LF (GAUZE/BANDAGES/DRESSINGS) ×1 IMPLANT
GAUZE SPONGE 4X4 12PLY STRL (GAUZE/BANDAGES/DRESSINGS) IMPLANT
GAUZE SPONGE 4X4 16PLY XRAY LF (GAUZE/BANDAGES/DRESSINGS) IMPLANT
GLOVE BIO SURGEON STRL SZ 6 (GLOVE) ×2 IMPLANT
GLOVE INDICATOR 6.5 STRL GRN (GLOVE) ×2 IMPLANT
GOWN STRL REUS W/ TWL XL LVL3 (GOWN DISPOSABLE) ×3 IMPLANT
GOWN STRL REUS W/TWL 2XL LVL3 (GOWN DISPOSABLE) ×2 IMPLANT
GOWN STRL REUS W/TWL LRG LVL3 (GOWN DISPOSABLE) IMPLANT
GOWN STRL REUS W/TWL XL LVL3 (GOWN DISPOSABLE) ×3
HEMOSTAT SURGICEL 4X8 (HEMOSTASIS) IMPLANT
KIT BASIN OR (CUSTOM PROCEDURE TRAY) ×2 IMPLANT
NEEDLE HYPO 22GX1.5 SAFETY (NEEDLE) ×2 IMPLANT
NS IRRIG 1000ML POUR BTL (IV SOLUTION) ×2 IMPLANT
PACK GENERAL/GYN (CUSTOM PROCEDURE TRAY) IMPLANT
PACK UNIVERSAL I (CUSTOM PROCEDURE TRAY) IMPLANT
PAIN PUMP ON-Q 400MLX5ML 5IN (MISCELLANEOUS) IMPLANT
PLUG CATH AND CAP STER (CATHETERS) ×2 IMPLANT
PORT LAP GEL ALEXIS MED 5-9CM (MISCELLANEOUS) IMPLANT
SCISSORS LAP 5X35 DISP (ENDOMECHANICALS) IMPLANT
SET IRRIG TUBING LAPAROSCOPIC (IRRIGATION / IRRIGATOR) ×2 IMPLANT
SHEARS HARMONIC ACE PLUS 36CM (ENDOMECHANICALS) IMPLANT
SLEEVE SURGEON STRL (DRAPES) IMPLANT
SLEEVE XCEL OPT CAN 5 100 (ENDOMECHANICALS) ×4 IMPLANT
SOLUTION ANTI FOG 6CC (MISCELLANEOUS) IMPLANT
SPONGE DRAIN TRACH 4X4 STRL 2S (GAUZE/BANDAGES/DRESSINGS) ×2 IMPLANT
SPONGE GAUZE 2X2 STER 10/PKG (GAUZE/BANDAGES/DRESSINGS) ×1
SPONGE LAP 18X18 X RAY DECT (DISPOSABLE) ×2 IMPLANT
STAPLER VISISTAT 35W (STAPLE) IMPLANT
STRIP CLOSURE SKIN 1/2X4 (GAUZE/BANDAGES/DRESSINGS) ×2 IMPLANT
SUCTION POOLE TIP (SUCTIONS) IMPLANT
SUT ETHILON 2 0 PS N (SUTURE) ×4 IMPLANT
SUT MNCRL AB 4-0 PS2 18 (SUTURE) ×2 IMPLANT
SUT PDS AB 0 CTX 60 (SUTURE) IMPLANT
SUT PDS AB 1 CT1 27 (SUTURE) ×4 IMPLANT
SUT VIC AB 2-0 SH 18 (SUTURE) ×2 IMPLANT
SUT VIC AB 3-0 SH 18 (SUTURE) ×2 IMPLANT
SUT VIC AB 3-0 SH 27 (SUTURE) ×1
SUT VIC AB 3-0 SH 27X BRD (SUTURE) ×1 IMPLANT
SUT VIC AB 3-0 SH 8-18 (SUTURE) IMPLANT
SYR BULB IRRIGATION 50ML (SYRINGE) ×2 IMPLANT
SYR CONTROL 10ML LL (SYRINGE) ×2 IMPLANT
SYS LAPSCP GELPORT 120MM (MISCELLANEOUS)
SYSTEM LAPSCP GELPORT 120MM (MISCELLANEOUS) IMPLANT
TOWEL OR 17X26 10 PK STRL BLUE (TOWEL DISPOSABLE) ×2 IMPLANT
TRAY FOLEY W/METER SILVER 14FR (SET/KITS/TRAYS/PACK) IMPLANT
TRAY FOLEY W/METER SILVER 16FR (SET/KITS/TRAYS/PACK) IMPLANT
TRAY LAPAROSCOPIC (CUSTOM PROCEDURE TRAY) ×2 IMPLANT
TROCAR BLADELESS OPT 5 100 (ENDOMECHANICALS) ×2 IMPLANT
TROCAR XCEL 12X100 BLDLESS (ENDOMECHANICALS) IMPLANT
TROCAR XCEL BLUNT TIP 100MML (ENDOMECHANICALS) ×2 IMPLANT
TROCAR XCEL NON-BLD 11X100MML (ENDOMECHANICALS) IMPLANT
TUBING CONNECTING 10 (TUBING) ×2 IMPLANT
TUBING INSUFFLATION 10FT LAP (TUBING) IMPLANT
YANKAUER SUCT BULB TIP NO VENT (SUCTIONS) ×2 IMPLANT

## 2015-06-15 NOTE — Anesthesia Procedure Notes (Signed)
Procedure Name: Intubation Date/Time: 05/30/2015 7:36 AM Performed by: Deliah Boston Pre-anesthesia Checklist: Patient identified, Emergency Drugs available, Suction available and Patient being monitored Patient Re-evaluated:Patient Re-evaluated prior to inductionOxygen Delivery Method: Circle System Utilized Preoxygenation: Pre-oxygenation with 100% oxygen Intubation Type: IV induction Ventilation: Mask ventilation without difficulty Laryngoscope Size: Mac and 3 Grade View: Grade I Tube type: Oral Tube size: 7.0 mm Number of attempts: 1 Airway Equipment and Method: Oral airway Placement Confirmation: ETT inserted through vocal cords under direct vision,  positive ETCO2 and breath sounds checked- equal and bilateral Secured at: 19 cm Tube secured with: Tape Dental Injury: Teeth and Oropharynx as per pre-operative assessment

## 2015-06-15 NOTE — H&P (Signed)
Lynn Morris is an 74 y.o. female.   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  Now has persistent n/v/inability to eat.    Past Medical History  Diagnosis Date  . Esophageal stricture   . Hyperlipemia   . Arthritis     LG TOE  . Cataract     left eye and immature  . Complication of anesthesia   . PONV (postoperative nausea and vomiting)   . Hiatal hernia 12-13-13    intermittent nausea with vomiting  . History of blood transfusion     no abnormal reaction noted, s/p stomach surgery 9'15([facility])  . History of bronchitis > 72yr ago  . History of migraine     many yrs ago  . Esophageal reflux     was on Prilosec;has been off a yr  . Diverticulosis   . Anxiety     takes Zoloft every other day  . Optic neuritis 1998  . [place]    gastroesophageal carcinoma  . Low blood pressure     thought to be related to dehydration LOV 08-01-14([facility]) given IV fluids  . Hypokalemia     tx. with potassium supplement  . Depression   . Headache     hx of  . Pneumonia     touch of it many yrs ago(>89yr    Past Surgical History  Procedure Laterality Date  . Abdominal hysterectomy  1978    Partial   . Cesarean section  1964 & 1966  . Nose surgery  1970  . Appendectomy    . Abdominal hysterectomy    . Eus N/A 12/22/2013    Procedure: UPPER ENDOSCOPIC ULTRASOUND (EUS) LINEAR;  Surgeon: DaMilus BanisterMD;  [facility];  Service: Endoscopy;  Laterality: N/A;  . Portacath placement N/A 12/27/2013    Procedure: INSERTION PORT-A-CATH;  Surgeon: FaStark KleinMD;  [place] [facility] [facility]: General;  Laterality: N/A;  . Esophagogastroduodenoscopy      with dilitation  . Colonoscopy    . Laparoscopy N/A 04/04/2014    Procedure: DIAGNOSTIC LAPAROSCOPY;  Surgeon: FaStark KleinMD;  [place] [facility] Service: General;  Laterality: N/A;  . Partial gastrectomy N/A 04/04/2014    Procedure: TOTAL GASTRECTOMY;  Surgeon: FaStark Klein MD;  [place] [facility] Service: General;  Laterality: N/A;  . Jejunostomy N/A 04/04/2014    Procedure: ESOPHAGOJEJUNOSTOMY AND FEEDING JEJUNOSTOMY;  Surgeon: FaStark KleinMD;  [place] [facility] Service: General;  Laterality: N/A;/ then removal.-no longer has.  . Esophagogastroduodenoscopy (egd) with propofol N/A 06/26/2014    Procedure: ESOPHAGOGASTRODUODENOSCOPY (EGD) WITH PROPOFOL;  Surgeon: JoIrene ShipperMD;  [facility];  Service: Endoscopy;  Laterality: N/A;  . Balloon dilation N/A 06/26/2014    Procedure: BALLOON DILATION;  Surgeon: JoIrene ShipperMD;  [facility];  Service: Endoscopy;  Laterality: N/A;  . Esophagogastroduodenoscopy N/A 08/08/2014    Procedure: ESOPHAGOGASTRODUODENOSCOPY (EGD);  Surgeon: JoIrene ShipperMD;  [facility];  Service: Endoscopy;  Laterality: N/A;  . Balloon dilation N/A 08/08/2014    Procedure: BALLOON DILATION;  Surgeon: JoIrene ShipperMD;  [facility];  Service: Endoscopy;  Laterality: N/A;  . Esophagogastroduodenoscopy N/A 12/26/2014    Procedure: ESOPHAGOGASTRODUODENOSCOPY (EGD);  Surgeon: JoIrene ShipperMD;  [facility];  Service: Endoscopy;  Laterality: N/A;  . Balloon dilation N/A 12/26/2014    Procedure: BALLOON DILATION;  Surgeon: JoIrene Shipper  MD;  [facility];  Service: Endoscopy;  Laterality: N/A;  . Port-a-cath removal N/A 01/09/2015    Procedure: REMOVAL PORT-A-CATH;  Surgeon: Stark Klein, MD;  Location: [place];  Service: General;  Laterality: N/A;    Family History  Problem Relation Age of Onset  . Diabetes Mother   . Hypertension Mother   . Pancreatic cancer Mother   . Colon cancer Neg Hx    Social History:  reports that she has never smoked. She has never used smokeless tobacco. She reports that she does not drink alcohol or use illicit drugs.  Allergies:  Allergies  Allergen Reactions  . Codeine     Unknown- patient does not remember    Medications Prior to  Admission  Medication Sig Dispense Refill  . Cholecalciferol (VITAMIN D3) 2000 UNITS TABS Take 1 tablet by mouth daily.     . famotidine (PEPCID) 20 MG tablet Take 1 tablet by mouth two  times daily 180 tablet 1  . KLOR-CON M20 20 MEQ tablet TAKE 1 TABLET BY MOUTH EVERY DAY 30 tablet 0  . ondansetron (ZOFRAN) 8 MG tablet Take 8 mg by mouth every 8 (eight) hours as needed for nausea.   2  . sertraline (ZOLOFT) 100 MG tablet Take 100 mg by mouth every other day. In evening    . sucralfate (CARAFATE) 1 GM/10ML suspension TAKE 10 MLS (1 G TOTAL) BY MOUTH 4 (FOUR) TIMES DAILY - WITH MEALS AND AT BEDTIME. (Patient taking differently: Take 1 g by mouth 4 (four) times daily -  with meals and at bedtime. ) 420 mL 12  . feeding supplement (OSMOLITE 1 CAL) LIQD After D/C, begin Osmolite 1.0 at 20 ml/hr via jejunostomy with 60 cc free water before and after continuous feeding.  If tolerated, increase to 30 cc/hr on Monday, Nov 28. DO NOT SEND FORMULA. Patient has donated formula. Send pump, supplies and RN for teaching. 1 mL 0    Results for orders placed or performed during the hospital encounter of 06/13/15 (from the past 48 hour(s))  ABO/Rh     Status: None   Collection Time: 06/13/15 10:30 AM  Result Value Ref Range   ABO/RH(D) A POS   Urinalysis, Routine w reflex microscopic (not at [facility])     Status: None   Collection Time: 06/13/15 10:40 AM  Result Value Ref Range   Color, Urine YELLOW YELLOW   APPearance CLEAR CLEAR   Specific Gravity, Urine 1.024 1.005 - 1.030   pH 6.0 5.0 - 8.0   Glucose, UA NEGATIVE NEGATIVE mg/dL   Hgb urine dipstick NEGATIVE NEGATIVE   Bilirubin Urine NEGATIVE NEGATIVE   Ketones, ur NEGATIVE NEGATIVE mg/dL   Protein, ur NEGATIVE NEGATIVE mg/dL   Nitrite NEGATIVE NEGATIVE   Leukocytes, UA NEGATIVE NEGATIVE    Comment: MICROSCOPIC NOT DONE ON URINES WITH NEGATIVE PROTEIN, BLOOD, LEUKOCYTES, NITRITE, OR GLUCOSE <1000 mg/dL.  CBC WITH DIFFERENTIAL     Status: Abnormal    Collection Time: 06/13/15 11:10 AM  Result Value Ref Range   WBC 3.5 (L) 4.0 - 10.5 K/uL   RBC 4.54 3.87 - 5.11 MIL/uL   Hemoglobin 13.0 12.0 - 15.0 g/dL   HCT 40.0 36.0 - 46.0 %   MCV 88.1 78.0 - 100.0 fL   MCH 28.6 26.0 - 34.0 pg   MCHC 32.5 30.0 - 36.0 g/dL   RDW 13.4 11.5 - 15.5 %   Platelets 225 150 - 400 K/uL   Neutrophils Relative %  64 %   Neutro Abs 2.2 1.7 - 7.7 K/uL   Lymphocytes Relative 28 %   Lymphs Abs 1.0 0.7 - 4.0 K/uL   Monocytes Relative 7 %   Monocytes Absolute 0.3 0.1 - 1.0 K/uL   Eosinophils Relative 1 %   Eosinophils Absolute 0.0 0.0 - 0.7 K/uL   Basophils Relative 0 %   Basophils Absolute 0.0 0.0 - 0.1 K/uL  Comprehensive metabolic panel     Status: Abnormal   Collection Time: 06/13/15 11:10 AM  Result Value Ref Range   Sodium 140 135 - 145 mmol/L   Potassium 4.8 3.5 - 5.1 mmol/L   Chloride 107 101 - 111 mmol/L   CO2 29 22 - 32 mmol/L   Glucose, Bld 86 65 - 99 mg/dL   BUN 20 6 - 20 mg/dL   Creatinine, Ser 0.91 0.44 - 1.00 mg/dL   Calcium 8.8 (L) 8.9 - 10.3 mg/dL   Total Protein 5.7 (L) 6.5 - 8.1 g/dL   Albumin 3.4 (L) 3.5 - 5.0 g/dL   AST 20 15 - 41 U/L   ALT 12 (L) 14 - 54 U/L   Alkaline Phosphatase 29 (L) 38 - 126 U/L   Total Bilirubin 1.0 0.3 - 1.2 mg/dL   GFR calc non Af Amer >60 >60 mL/min   GFR calc Af Amer >60 >60 mL/min    Comment: (NOTE) The eGFR has been calculated using the CKD EPI equation. This calculation has not been validated in all clinical situations. eGFR's persistently <60 mL/min signify possible Chronic Kidney Disease.    Anion gap 4 (L) 5 - 15  Protime-INR     Status: None   Collection Time: 06/13/15 11:10 AM  Result Value Ref Range   Prothrombin Time 14.2 11.6 - 15.2 seconds   INR 1.08 0.00 - 1.49  Type and screen     Status: None   Collection Time: 06/13/15 11:10 AM  Result Value Ref Range   ABO/RH(D) A POS    Antibody Screen NEG    Sample Expiration 06/27/2015    Extend sample reason NO TRANSFUSIONS OR PREGNANCY  IN THE PAST 3 MONTHS    No results found.  Review of Systems  Constitutional: Positive for weight loss.  All other systems reviewed and are negative.   Blood pressure 114/74, pulse 55, temperature 97.8 F (36.6 C), temperature source Oral, resp. rate 18, height 5' 1"  (1.549 m), weight 34.927 kg (77 lb), SpO2 100 %. Physical Exam  Constitutional: She is oriented to person, place, and time.  Cachectic  HENT:  Head: Normocephalic and atraumatic.  Eyes: Conjunctivae are normal. Pupils are equal, round, and reactive to light. No scleral icterus.  Neck: Normal range of motion.  Cardiovascular: Normal rate.   Respiratory: Effort normal.  GI: Soft.  scaphoid  Musculoskeletal: Normal range of motion.  Neurological: She is alert and oriented to person, place, and time.  Skin: Skin is warm and dry. No rash noted. No erythema. No pallor.  Psychiatric: She has a normal mood and affect. Her behavior is normal. Judgment and thought content normal.     Assessment/Plan Malnutrition. Place J tube. Dx laparoscopy. Discussed risks/benefits.    Tyne Banta 05/28/2015, 7:18 AM

## 2015-06-15 NOTE — Transfer of Care (Signed)
Immediate Anesthesia Transfer of Care Note  Patient: Lynn Morris  Procedure(s) Performed: Procedure(s): JEJUNOSTOMY (N/A) LAPAROSCOPY DIAGNOSTIC (N/A)  Patient Location: PACU  Anesthesia Type:General  Level of Consciousness: Patient easily awoken, sedated, comfortable, cooperative, following commands, responds to stimulation.   Airway & Oxygen Therapy: Patient spontaneously breathing, ventilating well, oxygen via simple oxygen mask.  Post-op Assessment: Report given to PACU RN, vital signs reviewed and stable, moving all extremities.   Post vital signs: Reviewed and stable.  Complications: No apparent anesthesia complications

## 2015-06-15 NOTE — Op Note (Signed)
PRE-OPERATIVE DIAGNOSIS: Malnutrition, recurrent gastric cancer  POST-OPERATIVE DIAGNOSIS:  Same  PROCEDURE:  Procedure(s): Diagnostic laparoscopy, J-tube placement, 16 French red rubber catheter  SURGEON:  Surgeon(s): Stark Klein, MD  ANESTHESIA:   general  DRAINS: Jejunostomy Tube   LOCAL MEDICATIONS USED:  OTHER exparel  SPECIMEN:  No Specimen  DISPOSITION OF SPECIMEN:  N/A  COUNTS:  YES  DICTATION: .Dragon Dictation  PLAN OF CARE: Admit to inpatient   PATIENT DISPOSITION:  PACU - hemodynamically stable.  FINDINGS:  Firm mass at upper midline.  Small volume ascites J tube at former J tube site.    EBL: min  PROCEDURE:  Pt was identified in the holding area and taken to the OR where she was placed supine on the OR table.  General anesthesia was induced.  Her arms were tucked and her abdomen was prepped and draped in sterile fashion. Timeout was performed according to the surgical safety check list. When all was correct, we continued.  A 1.5 cm incision was made in the lower midline. The subcutaneous tissues were spread with a Claiborne Billings.  2 Kocher clamps were used to elevate the fascia.  The fascia was incised in the midline. A pursestring suture of 0 Vicryl was placed around the fascial incision. The Sheryle Hail was introduced into the abdomen and secured with the tails of the suture. Pneumoperitoneum was achieved to a pressure of 15 mmHg.  Additional two 5 mm trocars were placed in the right lower abdomen. There was a small volume of ascites noted. There were dense adhesions to the upper midline with a firm area that is presumed to be a mass. The previous site of the J-tube was seen along with the pexy of the jejunum to the abdominal wall. This could not be accessed effectively laparoscopically.   A small incision was made in the lower midline. A small amount of bowel was taken off of the abdominal wall. The adhesions were not completely taken down and there was a firm mass in the  upper midline tethering the small bowel in this location. It was felt that trying to take these down would lead to significant numbers of enterotomies with poor healing and high risk for fistula. The previous jejunal site was accessed with a trocar. The 16 French red rubber catheter was threaded through the trocar into the efferent limb of the small bowel.  The small bowel pexy was reinforced with several 3-0 vicryl sutures.  The trocar was removed.  This was secured to the abdominal wall with two 2-0 nylon sutures.  The J-tube was flushed with approximately 50 mL of saline.  The fascia was then closed using running #1 PDS suture. The skin was irrigated and closed with 4-0 Monocryl in subcuticular fashion. The wounds were then cleaned, dried, and dressed with dry sterile dressings.

## 2015-06-16 LAB — CBC
HCT: 31.9 % — ABNORMAL LOW (ref 36.0–46.0)
Hemoglobin: 10.6 g/dL — ABNORMAL LOW (ref 12.0–15.0)
MCH: 29 pg (ref 26.0–34.0)
MCHC: 33.2 g/dL (ref 30.0–36.0)
MCV: 87.4 fL (ref 78.0–100.0)
Platelets: 201 10*3/uL (ref 150–400)
RBC: 3.65 MIL/uL — ABNORMAL LOW (ref 3.87–5.11)
RDW: 13.5 % (ref 11.5–15.5)
WBC: 6 10*3/uL (ref 4.0–10.5)

## 2015-06-16 LAB — GLUCOSE, CAPILLARY
GLUCOSE-CAPILLARY: 57 mg/dL — AB (ref 65–99)
GLUCOSE-CAPILLARY: 90 mg/dL (ref 65–99)
Glucose-Capillary: 95 mg/dL (ref 65–99)
Glucose-Capillary: 80 mg/dL (ref 65–99)

## 2015-06-16 LAB — BASIC METABOLIC PANEL
Anion gap: 5 (ref 5–15)
BUN: 27 mg/dL — AB (ref 6–20)
CALCIUM: 8.5 mg/dL — AB (ref 8.9–10.3)
CO2: 28 mmol/L (ref 22–32)
CREATININE: 0.93 mg/dL (ref 0.44–1.00)
Chloride: 102 mmol/L (ref 101–111)
GFR calc non Af Amer: 60 mL/min — ABNORMAL LOW (ref >60)
Glucose, Bld: 94 mg/dL (ref 65–99)
Potassium: 4.2 mmol/L (ref 3.5–5.1)
SODIUM: 135 mmol/L (ref 135–145)

## 2015-06-16 LAB — PHOSPHORUS: Phosphorus: 4.4 mg/dL (ref 2.5–4.6)

## 2015-06-16 LAB — MAGNESIUM: MAGNESIUM: 2 mg/dL (ref 1.7–2.4)

## 2015-06-16 MED ORDER — OSMOLITE 1.2 CAL PO LIQD: 1000.0000 mL | ORAL | Status: DC

## 2015-06-16 MED ORDER — OSMOLITE 1.2 CAL PO LIQD
1000.0000 mL | ORAL | Status: DC
  Administered 2015-06-16: 1000 mL
  Filled 2015-06-16 (×2): qty 1000

## 2015-06-16 MED ORDER — SODIUM CHLORIDE 0.9 % IV SOLN
Freq: Once | INTRAVENOUS | Status: AC
  Administered 2015-06-16: 500 mL/h via INTRAVENOUS

## 2015-06-16 MED ORDER — JEVITY 1.2 CAL PO LIQD
1000.0000 mL | ORAL | Status: DC
  Filled 2015-06-16: qty 1000

## 2015-06-16 NOTE — Progress Notes (Addendum)
1 Day Post-Op  Subjective: Had some hypotension last night.    Objective: Vital signs in last 24 hours: Temp:  [97.3 F (36.3 C)-98.4 F (36.9 C)] 97.6 F (36.4 C) (11/26 0553) Pulse Rate:  [65-69] 66 (11/26 0553) Resp:  [10-20] 14 (11/26 0800) BP: (84-102)/(49-67) 84/49 mmHg (11/26 0553) SpO2:  [98 %-100 %] 99 % (11/26 0800)    Intake/Output from previous day: 11/25 0701 - 11/26 0700 In: 1100 [I.V.:1100] Out: 3 [Urine:3] Intake/Output this shift:    General appearance: alert, cooperative and mild distress Resp: breathing comfortably GI: soft, non distended, approp tender.   Lab Results:   Recent Labs  06/13/15 1110 06/16/15 0513  WBC 3.5* 6.0  HGB 13.0 10.6*  HCT 40.0 31.9*  PLT 225 201   BMET  Recent Labs  06/13/15 1110 06/16/15 0513  NA 140 135  K 4.8 4.2  CL 107 102  CO2 29 28  GLUCOSE 86 94  BUN 20 27*  CREATININE 0.91 0.93  CALCIUM 8.8* 8.5*   PT/INR  Recent Labs  06/13/15 1110  LABPROT 14.2  INR 1.08   ABG No results for input(s): PHART, HCO3 in the last 72 hours.  Invalid input(s): PCO2, PO2  Studies/Results: No results found.  Anti-infectives: Anti-infectives    Start     Dose/Rate Route Frequency Ordered Stop   05/24/2015 1600  ceFAZolin (ANCEF) IVPB 2 g/50 mL premix     2 g 100 mL/hr over 30 Minutes Intravenous 3 times per day 06/16/2015 1031 05/23/2015 1717   05/28/2015 0527  ceFAZolin (ANCEF) IVPB 2 g/50 mL premix     2 g 100 mL/hr over 30 Minutes Intravenous On call to O.R. 05/30/2015 0527 06/19/2015 0746      Assessment/Plan: s/p Procedure(s): JEJUNOSTOMY (N/A) LAPAROSCOPY DIAGNOSTIC (N/A)  Severe protein calorie malnutrition.  start tube feeds today.   OK to do clears.     LOS: 1 day    [facility] 06/16/2015

## 2015-06-16 NOTE — Progress Notes (Signed)
Brief Nutrition Note  Consult received for enteral/tube feeding initiation and management.  Adult Enteral Nutrition Protocol initiated.   Oncology RD who is following (last note 11/23) reccomended "Patient should run continuous feedings Osmolite 1.0  at 20 mL an hour both Saturday and Sunday."  As Osmolite 1.0 not available inpatient. Will use Osmolite. 1.2  RD to f/u 11/28. Patient is severely malnourished and at high risk for refeeding syndrome.  Admitting Dx: MALNUTRITION, GASTRIC CANCER  Body mass index is 14.56 kg/(m^2). Pt meets criteria for Underweight based on current BMI.  Labs:   Recent Labs Lab 06/13/15 1110 06/16/15 0513  NA 140 135  K 4.8 4.2  CL 107 102  CO2 29 28  BUN 20 27*  CREATININE 0.91 0.93  CALCIUM 8.8* 8.5*  MG  --  2.0  PHOS  --  4.4  GLUCOSE 86 [place], [place] Nutrition Pager: [phone] 06/16/2015 7:50 AM

## 2015-06-16 NOTE — Progress Notes (Signed)
Paged physician to report BP of 85/48.  No return phone call yet.

## 2015-06-16 NOTE — Progress Notes (Signed)
Dr. Lucia Gaskins returned call.  Clarified with physician that patient is on tube feedings and clear liquid diet.  Reported to physician that pt BP 85/48 and physician gave no other orders.  Clarified return of prior fluids D5 1/2 NS w 10K at 93 and physician reports to return to the infusion.  Administer PO medications to patient orally not through tube per physician unless patient is not able to tolerate orally.

## 2015-06-17 LAB — GLUCOSE, CAPILLARY
GLUCOSE-CAPILLARY: 82 mg/dL (ref 65–99)
GLUCOSE-CAPILLARY: 92 mg/dL (ref 65–99)
GLUCOSE-CAPILLARY: 94 mg/dL (ref 65–99)
Glucose-Capillary: 111 mg/dL — ABNORMAL HIGH (ref 65–99)
Glucose-Capillary: 76 mg/dL (ref 65–99)
Glucose-Capillary: 77 mg/dL (ref 65–99)

## 2015-06-17 LAB — CBC
HCT: 31.5 % — ABNORMAL LOW (ref 36.0–46.0)
Hemoglobin: 10.5 g/dL — ABNORMAL LOW (ref 12.0–15.0)
MCH: 29.3 pg (ref 26.0–34.0)
MCHC: 33.3 g/dL (ref 30.0–36.0)
MCV: 88 fL (ref 78.0–100.0)
PLATELETS: 203 10*3/uL (ref 150–400)
RBC: 3.58 MIL/uL — ABNORMAL LOW (ref 3.87–5.11)
RDW: 13.6 % (ref 11.5–15.5)
WBC: 5.7 10*3/uL (ref 4.0–10.5)

## 2015-06-17 LAB — BASIC METABOLIC PANEL
Anion gap: 4 — ABNORMAL LOW (ref 5–15)
BUN: 23 mg/dL — AB (ref 6–20)
CALCIUM: 8.6 mg/dL — AB (ref 8.9–10.3)
CHLORIDE: 101 mmol/L (ref 101–111)
CO2: 27 mmol/L (ref 22–32)
CREATININE: 0.77 mg/dL (ref 0.44–1.00)
Glucose, Bld: 97 mg/dL (ref 65–99)
Potassium: 4 mmol/L (ref 3.5–5.1)
SODIUM: 132 mmol/L — AB (ref 135–145)

## 2015-06-17 MED ORDER — OSMOLITE 1.2 CAL PO LIQD
1000.0000 mL | ORAL | Status: DC
  Administered 2015-06-17: 1000 mL

## 2015-06-17 MED ORDER — OSMOLITE 1.2 CAL PO LIQD
1000.0000 mL | ORAL | Status: DC
Start: 2015-06-18 — End: 2015-06-18
  Filled 2015-06-17: qty 1000

## 2015-06-17 NOTE — Progress Notes (Addendum)
General Surgery Note  LOS: 2 days  POD -  2 Days Post-Op  Assessment/Plan: 1.  JEJUNOSTOMY, LAPAROSCOPY DIAGNOSTIC - 06/04/2015 - [facility]  TF at 20 cc/hr, will increase to 30 cc/hr  Taking po's, just can't get enough in to maintain nutrition - will advance to full liquids  2.  Severe protein calorie malnutrition 3.  Gastric cardia cancer  For blood draw at cancer center tomorrow for follow up.  That can be done while here. 4.  Esophageal stricture 5.  Anxiety/depression  6.  DVT prophylaxis - on no chemoprophylaxis   Active Problems:   Gastric cancer (HCC)   Malnutrition (HCC)  Subjective:  Doing well.  Not much pain.  Tolerating tube feedings so far.  No BM.  Not having much pain, will d/c PCA dilaudid.  Husband, Lynn Morris, in room Objective:   Filed Vitals:   06/17/15 0400 06/17/15 0535  BP:  84/63  Pulse:  71  Temp:  97.4 F (36.3 C)  Resp: 8 16     Intake/Output from previous day:  11/26 0701 - 11/27 0700 In: 560 [P.O.:560] Out: 3 [Urine:3]  Intake/Output this shift:      Physical Exam:   General: Thin older WF who is alert and oriented.   She was putting on her make up   HEENT: Normal. Pupils equal. .   Lungs: clear   Abdomen: soft   Wound: Jejunostomy tube in LUQ.  Dressings intact.   Lab Results:    Recent Labs  06/16/15 0513 06/17/15 0521  WBC 6.0 5.7  HGB 10.6* 10.5*  HCT 31.9* 31.5*  PLT 201 203    BMET   Recent Labs  06/16/15 0513 06/17/15 0521  NA 135 132*  K 4.2 4.0  CL 102 101  CO2 28 27  GLUCOSE 94 97  BUN 27* 23*  CREATININE 0.93 0.77  CALCIUM 8.5* 8.6*    PT/INR  No results for input(s): LABPROT, INR in the last 72 hours.  ABG  No results for input(s): PHART, HCO3 in the last 72 hours.  Invalid input(s): PCO2, PO2   Studies/Results:  No results found.   Anti-infectives:   Anti-infectives    Start     Dose/Rate Route Frequency Ordered Stop   06/04/2015 1600  ceFAZolin (ANCEF) IVPB 2 g/50 mL premix     2 g 100 mL/hr  over 30 Minutes Intravenous 3 times per day 06/11/2015 1031 06/07/2015 1717   06/08/2015 0527  ceFAZolin (ANCEF) IVPB 2 g/50 mL premix     2 g 100 mL/hr over 30 Minutes Intravenous On call to O.R. 06/14/2015 0527 06/16/2015 0746      Alphonsa Overall, MD, FACS Pager: [facility] Office: [phone] 06/17/2015

## 2015-06-17 NOTE — Progress Notes (Signed)
Initial Nutrition Assessment  DOCUMENTATION CODES:   Severe malnutrition in context of chronic illness, Underweight  INTERVENTION:   Monitor magnesium, potassium, and phosphorus daily for at least 3 days, MD to replete as needed, as pt is at risk for refeeding syndrome given severe malnutrition status and poor PO intake PTA.  Continue Osmolite 1.2 @ 20 ml/hr for today. This provides the patient 576 kcal (48% of needs), 27g protein and 394 ml H2O. If tolerates, may advance to 30 ml/hr on 11/28.  RD to continue to follow daily.  NUTRITION DIAGNOSIS:   Malnutrition related to chronic illness, cancer and cancer related treatments as evidenced by percent weight loss, energy intake < or equal to 75% for > or equal to 1 month, severe depletion of body fat, severe depletion of muscle mass.  GOAL:   Patient will meet greater than or equal to 90% of their needs  MONITOR:   PO intake, Labs, Weight trends, TF tolerance, Skin, I & O's  REASON FOR ASSESSMENT:   Consult Enteral/tube feeding initiation and management  ASSESSMENT:   74 y.o. 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 Now has persistent n/v/inability to eat.  11/25 : s/p Procedure(s): Diagnostic laparoscopy, J-tube placement, 16 French red rubber catheter   Patient followed by [place], last seen 11/23.  Patient was admitted and started on tube feeding over the weekend. Protocol initiated and Osmolite 1.2 started @ 20 ml/hr. Per [place] RD recommendations, pt is to infuse Osmolite 1.0 at home. Osmolite 1.0 not available on formulary, so Osmolite 1.2 began. Pt to remain at rate of 20 ml/hr until Monday, if tolerating can advance to 30 ml/hr. Will continue to monitor for tolerance and for refeeding.  Patient was placed on full liquid diet. She was taking a few bites of yogurt during RD visit. Pt very anxious about eating, however currently no nausea or vomiting in the last 24 hours.  Encouraged pt to take small bites of liquids if desired but it is not expected for her to consume large amounts of liquids, if any. Pt requested RD order her some chicken broth.   Per weight history, pt has lost 13 lb since 7/19 (14% weight loss x 4 months, significant for time frame). Pt is underweight.  Nutrition-Focused physical exam completed. Findings are severe fat depletion, severe muscle depletion, and no edema.   Labs reviewed: Low Na Elevated BUN Mg/Phos WNL  Diet Order:  Diet full liquid Room service appropriate?: Yes; Fluid consistency:: Thin  Skin:  Reviewed, no issues  Last BM:  PTA  Height:   Ht Readings from Last 1 Encounters:  06/02/2015 5\' 1"  (1.549 m)    Weight:   Wt Readings from Last 1 Encounters:  06/01/2015 77 lb (34.927 kg)    Ideal Body Weight:  47.7 kg  BMI:  Body mass index is 14.56 kg/(m^2).  Estimated Nutritional Needs:   Kcal:  1200-1300  Protein:  50-60g  Fluid:  1.2L/day  EDUCATION NEEDS:   Education needs addressed  Clayton Bibles, MS, RD, LDN Pager: [phone] After Hours Pager: [phone]

## 2015-06-17 NOTE — Anesthesia Postprocedure Evaluation (Signed)
Anesthesia Post Note  Patient: Lynn Morris  Procedure(s) Performed: Procedure(s) (LRB): JEJUNOSTOMY (N/A) LAPAROSCOPY DIAGNOSTIC (N/A)  Anesthesia Type: General Level of consciousness: awake and alert Pain management: pain level controlled Vital Signs Assessment: post-procedure vital signs reviewed and stable Respiratory status: spontaneous breathing, nonlabored ventilation, respiratory function stable and patient connected to nasal cannula oxygen Cardiovascular status: blood pressure returned to baseline and stable Postop Assessment: No signs of nausea or vomiting Anesthetic complications: no    Last Vitals:  Filed Vitals:   06/17/15 0400 06/17/15 0535  BP:  84/63  Pulse:  71  Temp:  36.3 C  Resp: 8 16    Last Pain:  Filed Vitals:   06/17/15 0536  PainSc: Asleep                 Seymore Brodowski, Kennetta JENNETTE

## 2015-06-18 LAB — GLUCOSE, CAPILLARY
GLUCOSE-CAPILLARY: 106 mg/dL — AB (ref 65–99)
GLUCOSE-CAPILLARY: 80 mg/dL (ref 65–99)
GLUCOSE-CAPILLARY: 84 mg/dL (ref 65–99)
GLUCOSE-CAPILLARY: 97 mg/dL (ref 65–99)
GLUCOSE-CAPILLARY: 97 mg/dL (ref 65–99)
Glucose-Capillary: 107 mg/dL — ABNORMAL HIGH (ref 65–99)
Glucose-Capillary: 87 mg/dL (ref 65–99)

## 2015-06-18 LAB — BASIC METABOLIC PANEL
ANION GAP: 3 — AB (ref 5–15)
BUN: 16 mg/dL (ref 6–20)
CALCIUM: 8.6 mg/dL — AB (ref 8.9–10.3)
CHLORIDE: 99 mmol/L — AB (ref 101–111)
CO2: 27 mmol/L (ref 22–32)
CREATININE: 0.63 mg/dL (ref 0.44–1.00)
GFR calc non Af Amer: >60 mL/min (ref >60)
GLUCOSE: 82 mg/dL (ref 65–99)
Potassium: 4.1 mmol/L (ref 3.5–5.1)
Sodium: 129 mmol/L — ABNORMAL LOW (ref 135–145)

## 2015-06-18 LAB — CBC
HEMATOCRIT: 32.8 % — AB (ref 36.0–46.0)
HEMOGLOBIN: 11.2 g/dL — AB (ref 12.0–15.0)
MCH: 29.6 pg (ref 26.0–34.0)
MCHC: 34.1 g/dL (ref 30.0–36.0)
MCV: 86.5 fL (ref 78.0–100.0)
Platelets: 207 10*3/uL (ref 150–400)
RBC: 3.79 MIL/uL — ABNORMAL LOW (ref 3.87–5.11)
RDW: 13.4 % (ref 11.5–15.5)
WBC: 5.3 10*3/uL (ref 4.0–10.5)

## 2015-06-18 MED ORDER — OXYCODONE HCL 5 MG PO TABS: 5.0000 mg | ORAL_TABLET | ORAL | Status: AC | PRN

## 2015-06-18 MED ORDER — POLYETHYLENE GLYCOL 3350 17 G PO PACK
17.0000 g | PACK | Freq: Two times a day (BID) | ORAL | Status: DC
  Administered 2015-06-18 – 2015-06-19 (×3): 17 g via ORAL
  Filled 2015-06-18 (×5): qty 1

## 2015-06-18 MED ORDER — BISACODYL 10 MG RE SUPP
10.0000 mg | Freq: Every day | RECTAL | Status: DC
  Administered 2015-06-18 – 2015-06-19 (×2): 10 mg via RECTAL
  Filled 2015-06-18 (×2): qty 1

## 2015-06-18 MED ORDER — OSMOLITE 1.2 CAL PO LIQD
1000.0000 mL | ORAL | Status: DC
  Administered 2015-06-18 – 2015-06-19 (×2): 1000 mL
  Filled 2015-06-18 (×2): qty 1000

## 2015-06-18 MED ORDER — CETYLPYRIDINIUM CHLORIDE 0.05 % MT LIQD
7.0000 mL | Freq: Two times a day (BID) | OROMUCOSAL | Status: DC
  Administered 2015-06-18 – 2015-06-21 (×4): 7 mL via OROMUCOSAL

## 2015-06-18 NOTE — Progress Notes (Signed)
Nutrition Follow-up  DOCUMENTATION CODES:   Severe malnutrition in context of chronic illness, Underweight  INTERVENTION:   Monitor magnesium, potassium, and phosphorus daily for at least 3 days, MD to replete as needed, as pt is at risk for refeeding syndrome given severe malnutrition status and poor PO intake PTA.  Continue Osmolite 1.2 @ 30 ml/hr.  Advancement to be determined based on labs and tolerance.  Current TF regimen @ 30 ml/hr, provides 864 kcal (72% of needs), 40 g protein and 590 ml free water.  RD to continue to follow-up daily.  NUTRITION DIAGNOSIS:   Malnutrition related to chronic illness, cancer and cancer related treatments as evidenced by percent weight loss, energy intake < or equal to 75% for > or equal to 1 month, severe depletion of body fat, severe depletion of muscle mass.  Ongoing.  GOAL:   Patient will meet greater than or equal to 90% of their needs  Progressing.  MONITOR:   PO intake, Labs, Weight trends, TF tolerance, Skin, I & O's  ASSESSMENT:   74 y.o. 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 Now has persistent n/v/inability to eat.  11/25 : s/p Procedure(s): Diagnostic laparoscopy, J-tube placement, 16 French red rubber catheter   Patient reports tolerating Osmolite 1.2 @ 30 so far. Pt with some complaints regarding her meal orders from the kitchen. She states she has been drinking fluids, currently drinking her Miralax. Encouraged her to walk today. Will continue to monitor for tolerance and refeeding.  Labs reviewed: Low Na  Diet Order:  Diet full liquid Room service appropriate?: Yes; Fluid consistency:: Thin  Skin:  Reviewed, no issues  Last BM:  PTA  Height:   Ht Readings from Last 1 Encounters:  05/24/2015 5\' 1"  (1.549 m)    Weight:   Wt Readings from Last 1 Encounters:  06/18/15 84 lb 6.4 oz (38.284 kg)    Ideal Body Weight:  47.7 kg  BMI:  Body mass index is 15.96 kg/(m^2).  Estimated  Nutritional Needs:   Kcal:  1200-1300  Protein:  50-60g  Fluid:  1.2L/day  EDUCATION NEEDS:   Education needs addressed  Clayton Bibles, MS, RD, LDN Pager: [phone] After Hours Pager: [phone]

## 2015-06-18 NOTE — Care Management Note (Signed)
Case Management Note  Patient Details  Name: Lynn Morris MRN: [id number] Date of Birth: [birth year]  Subjective/Objective:       Inability to eat with hypotensive state             Action/Plan:Date: June 18, 2015 Chart reviewed for concurrent status and case management needs. Will continue to follow patient for changes and needs: Velva Harman, RN, BSN, [place]   [phone]   Expected Discharge Date:  06/20/15               Expected Discharge Plan:  [place]  In-House Referral:  Clinical Social Work  Discharge planning Services  CM Consult  Post Acute Care Choice:  NA Choice offered to:  NA  DME Arranged:    DME Agency:     HH Arranged:    [place] Agency:     Status of Service:  In process, will continue to follow  Medicare Important Message Given:    Date Medicare IM Given:    Medicare IM give by:    Date Additional Medicare IM Given:    Additional Medicare Important Message give by:     If discussed at [place] of Stay Meetings, dates discussed:    Additional Comments:  [name], RN 06/18/2015, 12:21 PM

## 2015-06-18 NOTE — Care Management Important Message (Signed)
Important Message  Patient Details  Name: TRENADY DEBENEDETTO MRN: [id number] Date of Birth: 04-06-1941   Medicare Important Message Given:  Yes    Camillo Flaming 06/18/2015, 12:30 PMImportant Message  Patient Details  Name: FARYN CHRZANOWSKI MRN: [id number] Date of Birth: 04/19/1941   Medicare Important Message Given:  Yes    Camillo Flaming 06/18/2015, 12:30 PM

## 2015-06-18 NOTE — Progress Notes (Signed)
Patient ID: Lynn Morris, female   DOB: 1940/08/19, 74 y.o.   MRN: [id number] General Surgery Note  LOS: 3 days  POD -  3 Days Post-Op  Assessment/Plan: 1.  JEJUNOSTOMY, LAPAROSCOPY DIAGNOSTIC - 05/27/2015 - F. Jailynne Opperman  TF at 20 cc/hr, will increase to 30 cc/hr today  Taking po's, just can't get enough in to maintain nutrition -  full liquids  2.  Severe protein calorie malnutrition 3.  Gastric cardia cancer  Labs done while here.(cmet and magnesium/phosphorus.) 4.  Esophageal stricture 5.  Anxiety/depression  6.  DVT prophylaxis - on no chemoprophylaxis 7.  Constipation - add suppository and miralax.     Active Problems:   Gastric cancer (HCC)   Malnutrition (HCC)  Subjective:  Having more lower abdominal pain here.  No n/v.  Difficulty with constipation. No bm since Friday.  Objective:   Filed Vitals:   06/17/15 2140 06/18/15 0429  BP: 108/69 98/52  Pulse: 75 73  Temp: 98 F (36.7 C)   Resp: 18 18     Intake/Output from previous day:  11/27 0701 - 11/28 0700 In: 740 [P.O.:740] Out: 450 [Urine:450]  Intake/Output this shift:      Physical Exam:   General: Thin older WF who is alert and oriented.      HEENT: Normal. Pupils equal.   Abdomen: soft   Wound: Jejunostomy tube in LUQ.  Some dried blood on steristrips.  No erythema or drainage.     Lab Results:     Recent Labs  06/17/15 0521 06/18/15 0515  WBC 5.7 5.3  HGB 10.5* 11.2*  HCT 31.5* 32.8*  PLT 203 207    BMET    Recent Labs  06/17/15 0521 06/18/15 0515  NA 132* 129*  K 4.0 4.1  CL 101 99*  CO2 27 27  GLUCOSE 97 82  BUN 23* 16  CREATININE 0.77 0.63  CALCIUM 8.6* 8.6*    PT/INR  No results for input(s): LABPROT, INR in the last 72 hours.  ABG  No results for input(s): PHART, HCO3 in the last 72 hours.  Invalid input(s): PCO2, PO2   Studies/Results:  No results found.   Anti-infectives:   Anti-infectives    Start     Dose/Rate Route Frequency Ordered Stop   06/03/2015 1600   ceFAZolin (ANCEF) IVPB 2 g/50 mL premix     2 g 100 mL/hr over 30 Minutes Intravenous 3 times per day 05/26/2015 1031 05/31/2015 1717   06/08/2015 0527  ceFAZolin (ANCEF) IVPB 2 g/50 mL premix     2 g 100 mL/hr over 30 Minutes Intravenous On call to O.R. 06/16/2015 0527 06/14/2015 [facility] Office: [phone] 06/18/2015

## 2015-06-19 LAB — GLUCOSE, CAPILLARY
GLUCOSE-CAPILLARY: 124 mg/dL — AB (ref 65–99)
GLUCOSE-CAPILLARY: 144 mg/dL — AB (ref 65–99)
Glucose-Capillary: 145 mg/dL — ABNORMAL HIGH (ref 65–99)
Glucose-Capillary: 167 mg/dL — ABNORMAL HIGH (ref 65–99)

## 2015-06-19 LAB — CBC
HCT: 38.8 % (ref 36.0–46.0)
Hemoglobin: 13.2 g/dL (ref 12.0–15.0)
MCH: 29.4 pg (ref 26.0–34.0)
MCHC: 34 g/dL (ref 30.0–36.0)
MCV: 86.4 fL (ref 78.0–100.0)
PLATELETS: 283 10*3/uL (ref 150–400)
RBC: 4.49 MIL/uL (ref 3.87–5.11)
RDW: 13.5 % (ref 11.5–15.5)
WBC: 2.8 10*3/uL — AB (ref 4.0–10.5)

## 2015-06-19 LAB — BASIC METABOLIC PANEL
ANION GAP: 5 (ref 5–15)
BUN: 18 mg/dL (ref 6–20)
CALCIUM: 8.8 mg/dL — AB (ref 8.9–10.3)
CO2: 24 mmol/L (ref 22–32)
Chloride: 98 mmol/L — ABNORMAL LOW (ref 101–111)
Creatinine, Ser: 0.63 mg/dL (ref 0.44–1.00)
GFR calc Af Amer: >60 mL/min (ref >60)
GLUCOSE: 136 mg/dL — AB (ref 65–99)
Potassium: 4.9 mmol/L (ref 3.5–5.1)
SODIUM: 127 mmol/L — AB (ref 135–145)

## 2015-06-19 LAB — MAGNESIUM: MAGNESIUM: 1.9 mg/dL (ref 1.7–2.4)

## 2015-06-19 LAB — PHOSPHORUS: Phosphorus: 3.1 mg/dL (ref 2.5–4.6)

## 2015-06-19 MED ORDER — SORBITOL 70 % SOLN
960.0000 mL | TOPICAL_OIL | Freq: Once | ORAL | Status: AC
  Administered 2015-06-19: 960 mL via RECTAL
  Filled 2015-06-19: qty 240

## 2015-06-19 MED ORDER — OSMOLITE 1.2 CAL PO LIQD
1000.0000 mL | ORAL | Status: DC
  Administered 2015-06-20: 1000 mL
  Filled 2015-06-19: qty 1000

## 2015-06-19 MED ORDER — KCL IN DEXTROSE-NACL 20-5-0.9 MEQ/L-%-% IV SOLN
INTRAVENOUS | Status: DC
  Administered 2015-06-19: 10:00:00 via INTRAVENOUS
  Filled 2015-06-19 (×4): qty 1000

## 2015-06-19 NOTE — Progress Notes (Addendum)
Patient ID: Lynn Morris, female   DOB: [birth year], 74 y.o.   MRN: [id number] General Surgery Note  LOS: 4 days  POD -  4 Days Post-Op  Assessment/Plan: 1.  JEJUNOSTOMY, LAPAROSCOPY DIAGNOSTIC - 05/24/2015 - F. Oprah Camarena  TF at 30 ml/hr.  Will increase to 40 ml/hr.    Taking po's, just can't get enough in to maintain nutrition -  full liquids  2.  Severe protein calorie malnutrition 3.  Gastric cardia cancer  Labs done while here.(cmet and magnesium/phosphorus.) 4.  Esophageal stricture 5.  Anxiety/depression  6.  DVT prophylaxis - on no chemoprophylaxis 7.  Constipation - enema today. 8.  Hyponatremia.  Add NS to fluids.   9.  Pain, goals of care- palliative care consult.  Not ready for hospice yet.  Wants to try to get stronger and see if more chemotherapy is possible.     Active Problems:   Gastric cancer ([place])   Malnutrition (HCC)  Subjective:  Working with suppositories and miralax.     Objective:   Filed Vitals:   06/18/15 2100 06/19/15 0500  BP: 119/68 126/79  Pulse: 69 80  Temp: 97.7 F (36.5 C) 97.9 F (36.6 C)  Resp: 18 18     Intake/Output from previous day:  11/28 0701 - 11/29 0700 In: 1260 [P.O.:360; I.V.:900] Out: 100 [Urine:100]  Intake/Output this shift:      Physical Exam:   General: Thin older WF who is alert and oriented.      HEENT: Normal. Pupils equal.   Abdomen: soft   Wound: Jejunostomy tube in LUQ.  Some dried blood on steristrips.  No erythema or drainage.     Lab Results:     Recent Labs  06/18/15 0515 06/19/15 0535  WBC 5.3 2.8*  HGB 11.2* 13.2  HCT 32.8* 38.8  PLT 207 283    BMET    Recent Labs  06/18/15 0515 06/19/15 0535  NA 129* 127*  K 4.1 4.9  CL 99* 98*  CO2 27 24  GLUCOSE 82 136*  BUN 16 18  CREATININE 0.63 0.63  CALCIUM 8.6* 8.8*    PT/INR  No results for input(s): LABPROT, INR in the last 72 hours.  ABG  No results for input(s): PHART, HCO3 in the last 72 hours.  Invalid input(s): PCO2,  PO2   Studies/Results:  No results found.   Anti-infectives:   Anti-infectives    Start     Dose/Rate Route Frequency Ordered Stop   06/07/2015 1600  ceFAZolin (ANCEF) IVPB 2 g/50 mL premix     2 g 100 mL/hr over 30 Minutes Intravenous 3 times per day 06/02/2015 1031 05/29/2015 1717   05/26/2015 0527  ceFAZolin (ANCEF) IVPB 2 g/50 mL premix     2 g 100 mL/hr over 30 Minutes Intravenous On call to O.R. 06/18/2015 0527 06/09/2015 [facility] Office: [id number] 06/19/2015

## 2015-06-19 NOTE — Progress Notes (Signed)
Nutrition Follow-up  DOCUMENTATION CODES:   Severe malnutrition in context of chronic illness, Underweight  INTERVENTION:   Monitor magnesium, potassium, and phosphorus daily for at least 3 days, MD to replete as needed, as pt is at risk for refeeding syndrome given severe malnutrition status and poor PO intake PTA.  Advance Osmolite 1.2 to goal rate of 40 ml/hr.  This goal provides 1152 kcal (96% of needs), 52 g protein and 779 ml of free water.  RD to continue to follow-up daily.  NUTRITION DIAGNOSIS:   Malnutrition related to chronic illness, cancer and cancer related treatments as evidenced by percent weight loss, energy intake < or equal to 75% for > or equal to 1 month, severe depletion of body fat, severe depletion of muscle mass.  Ongoing.  GOAL:   Patient will meet greater than or equal to 90% of their needs  Meeting 96% of kcal and 100% of protein needs.  MONITOR:   PO intake, Labs, Weight trends, TF tolerance, Skin, I & O's  ASSESSMENT:   74 y.o. 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 Now has persistent n/v/inability to eat.   Patient in room with family and RN at bedside. Pt very drowsy at this time from pain medication. Spoke with [place] RD this morning, will advance TF to goal rate of 40 ml/hr, which provides all of patient's needs. Will continue to monitor for tolerance and refeeding.  Labs reviewed: CBGs: 124-145 Low Na Mg/Phos WNL  Diet Order:  Diet full liquid Room service appropriate?: Yes; Fluid consistency:: Thin  Skin:  Reviewed, no issues  Last BM:  PTA  Height:   Ht Readings from Last 1 Encounters:  06/11/2015 5\' 1"  (1.549 m)    Weight:   Wt Readings from Last 1 Encounters:  06/18/15 84 lb 6.4 oz (38.284 kg)    Ideal Body Weight:  47.7 kg  BMI:  Body mass index is 15.96 kg/(m^2).  Estimated Nutritional Needs:   Kcal:  1200-1400  Protein:  50-60g  Fluid:  1.2L/day  EDUCATION NEEDS:    Education needs addressed  Clayton Bibles, MS, RD, LDN Pager: [phone] After Hours Pager: [phone]

## 2015-06-19 NOTE — Progress Notes (Addendum)
Patient having mild confusion and agitation this evening.  States "something is not right", asking for all nursing staff to come to her room so she can "find out whats going on". This RN, Petra Kuba RN and Lennice Sites NT went to room together to attempt to console patient but she did not believe that we were nurses. Patient refused to take pain medication offered to her.

## 2015-06-19 NOTE — Progress Notes (Signed)
Patient removed IV from her right arm. She refuses to have an IV placed. She seems to be paranoid that nursing staff is trying to harm her. Her J tube is still intact, she does protect it. She refuses to have the soiled dressing change around the J tube. She continues to refuse pain medication despite guarding her abdomen.

## 2015-06-20 ENCOUNTER — Inpatient Hospital Stay (HOSPITAL_COMMUNITY): Payer: Medicare Other

## 2015-06-20 DIAGNOSIS — R410 Disorientation, unspecified: Secondary | ICD-10-CM

## 2015-06-20 LAB — URINE MICROSCOPIC-ADD ON
RBC / HPF: NONE SEEN RBC/hpf (ref 0–5)
Squamous Epithelial / LPF: NONE SEEN

## 2015-06-20 LAB — URINALYSIS, ROUTINE W REFLEX MICROSCOPIC
GLUCOSE, UA: NEGATIVE mg/dL
HGB URINE DIPSTICK: NEGATIVE
KETONES UR: NEGATIVE mg/dL
LEUKOCYTES UA: NEGATIVE
Nitrite: NEGATIVE
PH: 5.5 (ref 5.0–8.0)
PROTEIN: 30 mg/dL — AB
Specific Gravity, Urine: 1.025 (ref 1.005–1.030)

## 2015-06-20 LAB — COMPREHENSIVE METABOLIC PANEL
ALT: 16 U/L (ref 14–54)
ANION GAP: 9 (ref 5–15)
AST: 28 U/L (ref 15–41)
Albumin: 2.6 g/dL — ABNORMAL LOW (ref 3.5–5.0)
Alkaline Phosphatase: 43 U/L (ref 38–126)
BILIRUBIN TOTAL: 1.4 mg/dL — AB (ref 0.3–1.2)
BUN: 23 mg/dL — ABNORMAL HIGH (ref 6–20)
CALCIUM: 9 mg/dL (ref 8.9–10.3)
CHLORIDE: 99 mmol/L — AB (ref 101–111)
CO2: 22 mmol/L (ref 22–32)
Creatinine, Ser: 0.82 mg/dL (ref 0.44–1.00)
Glucose, Bld: 123 mg/dL — ABNORMAL HIGH (ref 65–99)
POTASSIUM: 5.2 mmol/L — AB (ref 3.5–5.1)
Sodium: 130 mmol/L — ABNORMAL LOW (ref 135–145)
Total Protein: 5.8 g/dL — ABNORMAL LOW (ref 6.5–8.1)

## 2015-06-20 LAB — CBC
HCT: 36.7 % (ref 36.0–46.0)
HEMOGLOBIN: 12.8 g/dL (ref 12.0–15.0)
MCH: 29.9 pg (ref 26.0–34.0)
MCHC: 34.9 g/dL (ref 30.0–36.0)
MCV: 85.7 fL (ref 78.0–100.0)
PLATELETS: 276 10*3/uL (ref 150–400)
RBC: 4.28 MIL/uL (ref 3.87–5.11)
RDW: 13.5 % (ref 11.5–15.5)
WBC: 3 10*3/uL — AB (ref 4.0–10.5)

## 2015-06-20 LAB — GLUCOSE, CAPILLARY
GLUCOSE-CAPILLARY: 142 mg/dL — AB (ref 65–99)
Glucose-Capillary: 120 mg/dL — ABNORMAL HIGH (ref 65–99)

## 2015-06-20 LAB — MAGNESIUM: MAGNESIUM: 2.2 mg/dL (ref 1.7–2.4)

## 2015-06-20 MED ORDER — OLANZAPINE 5 MG PO TABS
5.0000 mg | ORAL_TABLET | Freq: Every day | ORAL | Status: DC
  Filled 2015-06-20: qty 1

## 2015-06-20 MED ORDER — ONDANSETRON HCL 4 MG/2ML IJ SOLN
4.0000 mg | Freq: Three times a day (TID) | INTRAMUSCULAR | Status: DC
  Administered 2015-06-20 – 2015-06-21 (×3): 4 mg via INTRAVENOUS
  Filled 2015-06-20 (×3): qty 2

## 2015-06-20 MED ORDER — PANCRELIPASE (LIP-PROT-AMYL) 12000-38000 UNITS PO CPEP
2.0000 | ORAL_CAPSULE | Freq: Once | ORAL | Status: AC
  Administered 2015-06-20: 24000 [IU] via ORAL
  Filled 2015-06-20 (×2): qty 2

## 2015-06-20 MED ORDER — BISACODYL 10 MG RE SUPP: 10.0000 mg | Freq: Every day | RECTAL | Status: DC | PRN

## 2015-06-20 MED ORDER — PANCRELIPASE (LIP-PROT-AMYL) 12000-38000 UNITS PO CPEP
2.0000 | ORAL_CAPSULE | Freq: Once | ORAL | Status: AC
  Filled 2015-06-20: qty 2

## 2015-06-20 MED ORDER — BISACODYL 5 MG PO TBEC: 5.0000 mg | DELAYED_RELEASE_TABLET | Freq: Every day | ORAL | Status: DC

## 2015-06-20 MED ORDER — SODIUM CHLORIDE 0.9 % IV SOLN
INTRAVENOUS | Status: DC
  Administered 2015-06-20 – 2015-06-21 (×3): via INTRAVENOUS

## 2015-06-20 MED ORDER — SODIUM BICARBONATE 650 MG PO TABS
650.0000 mg | ORAL_TABLET | Freq: Once | ORAL | Status: AC
  Administered 2015-06-20: 650 mg via ORAL
  Filled 2015-06-20 (×2): qty 1

## 2015-06-20 MED ORDER — MAGNESIUM HYDROXIDE 400 MG/5ML PO SUSP
30.0000 mL | Freq: Every day | ORAL | Status: DC
  Administered 2015-06-20: 30 mL
  Filled 2015-06-20: qty 30

## 2015-06-20 MED ORDER — FENTANYL CITRATE (PF) 100 MCG/2ML IJ SOLN
25.0000 ug | INTRAMUSCULAR | Status: DC | PRN
  Administered 2015-06-20 – 2015-06-21 (×10): 25 ug via INTRAVENOUS
  Filled 2015-06-20 (×11): qty 2

## 2015-06-20 MED ORDER — HALOPERIDOL LACTATE 5 MG/ML IJ SOLN: 1.0000 mg | Freq: Four times a day (QID) | INTRAMUSCULAR | Status: DC | PRN

## 2015-06-20 MED ORDER — OLANZAPINE 5 MG PO TABS
5.0000 mg | ORAL_TABLET | Freq: Every day | ORAL | Status: DC
  Administered 2015-06-20 (×2): 5 mg
  Filled 2015-06-20: qty 1

## 2015-06-20 MED ORDER — SODIUM BICARBONATE 650 MG PO TABS: 650.0000 mg | ORAL_TABLET | Freq: Once | ORAL | Status: AC

## 2015-06-20 MED ORDER — ACETAMINOPHEN 325 MG PO TABS
650.0000 mg | ORAL_TABLET | Freq: Four times a day (QID) | ORAL | Status: DC
  Administered 2015-06-20 – 2015-06-21 (×4): 650 mg via ORAL
  Filled 2015-06-20 (×4): qty 2

## 2015-06-20 MED ORDER — SORBITOL 70 % SOLN
30.0000 mL | Freq: Two times a day (BID) | Status: DC
  Administered 2015-06-20: 30 mL
  Filled 2015-06-20: qty 30

## 2015-06-20 MED ORDER — PROMETHAZINE HCL 25 MG/ML IJ SOLN: 6.2500 mg | Freq: Four times a day (QID) | INTRAMUSCULAR | Status: DC | PRN

## 2015-06-20 NOTE — Progress Notes (Signed)
Pt tranferred to 3W. Full report given to RN Maudie Mercury.

## 2015-06-20 NOTE — Progress Notes (Signed)
This RN call pts husband to update on patients condition. He states she had been confused for some of the day. He came in to be with patient around 0100.

## 2015-06-20 NOTE — Progress Notes (Signed)
Medication mixture instilled into clogged J-Tube.  Will clamp tube for 15-30 minutes.

## 2015-06-20 NOTE — Plan of Care (Deleted)
After 54min, j tube will not flush. Will instill another mixture and try again per order.

## 2015-06-20 NOTE — Progress Notes (Signed)
Patient ID: Lynn Morris, female   DOB: [birth year], 74 y.o.   MRN: [id number] General Surgery Note  LOS: 5 days  POD -  5 Days Post-Op  Assessment/Plan: 1.  JEJUNOSTOMY, LAPAROSCOPY DIAGNOSTIC - 06/14/2015 - F. Ednah Hammock  40 ml/hr.  Having some pain.    Taking po's, just can't get enough in to maintain nutrition -  full liquids 2.  Severe protein calorie malnutrition 3.  Gastric cardia cancer  Recurrent.  Trying to get better enough and gain enough weight to pursue chemotherapy.   4.  Esophageal stricture 5.  Anxiety/depression 6.  DVT prophylaxis - on no chemoprophylaxis 7.  Constipation - BM yesterday and today.   8.  Hyponatremia.  Add NS to fluids.   9.  Pain, goals of care- palliative care consult.  Not ready for hospice yet.  Wants to try to get stronger and see if more chemotherapy is possible.   10.  Delirium - haldol if she will let us get iv in. Likely related to sleep deprivation, narcotics, and unfamiliar environment.   Active Problems:   Gastric cancer (HCC)   Malnutrition (HCC)  Subjective:  Confused overnight.  Paranoid, thinking staff is trying to block her phone and delete her contacts.  Pulled out her IV and won't let anyone put it back in.      Objective:   Filed Vitals:   06/20/15 0215 06/20/15 0652  BP: 158/72 164/95  Pulse: 96 87  Temp: 98.4 F (36.9 C) 97.6 F (36.4 C)  Resp: 18 18     Intake/Output from previous day:  11/29 0701 - 11/30 0700 In: 270 [P.O.:270] Out: -   Intake/Output this shift:      Physical Exam:   General: Thin older WF who is confused about what is going on.   HEENT: Normal. Pupils equal.   Abdomen: soft, sl distended.     Wound: Jejunostomy tube in LUQ.  Some dried blood on steristrips.  No erythema or drainage.     Lab Results:     Recent Labs  06/18/15 0515 06/19/15 0535  WBC 5.3 2.8*  HGB 11.2* 13.2  HCT 32.8* 38.8  PLT 207 283    BMET    Recent Labs  06/18/15 0515 06/19/15 0535  NA 129* 127*  K 4.1 4.9   CL 99* 98*  CO2 27 24  GLUCOSE 82 136*  BUN 16 18  CREATININE 0.63 0.63  CALCIUM 8.6* 8.8*    PT/INR  No results for input(s): LABPROT, INR in the last 72 hours.  ABG  No results for input(s): PHART, HCO3 in the last 72 hours.  Invalid input(s): PCO2, PO2   Studies/Results:  No results found.   Anti-infectives:   Anti-infectives    Start     Dose/Rate Route Frequency Ordered Stop   06/19/2015 1600  ceFAZolin (ANCEF) IVPB 2 g/50 mL premix     2 g 100 mL/hr over 30 Minutes Intravenous 3 times per day 06/07/2015 1031 06/09/2015 1717   06/14/2015 0527  ceFAZolin (ANCEF) IVPB 2 g/50 mL premix     2 g 100 mL/hr over 30 Minutes Intravenous On call to O.R. 06/17/2015 0527 06/08/2015 [facility] Office: [id number] 06/20/2015

## 2015-06-20 NOTE — Progress Notes (Signed)
Unable to flush J-tube.  Notified Abigail Butts, RN at [facility] who stated she will notify Dr. Barry Dienes.

## 2015-06-20 NOTE — Consult Note (Signed)
Met with [facility] and a close friend- her husband is at home sleeping after a difficult night with worsening delirium. I reviewed her history and data in detail. Ms. Eilts vomited at least 6 times while I was in the room, she is in clear distress and having uncontrolled pain. Her IV access was lost last PM so they had only given her oxycodone at 8AM which tends to be more emetogenic than other opiates. I examined the patient and found an IV in her left arm that had been placed earlier today by IV team. I discussed her care with RN Caryl Pina who reports she had been refusing care-but she has had a family member at bedside ATC and is clearly having significant confusion, paranoia and delirium.  Issues: 1. Post- Op pain 2. Acute Delirium 3. Severe Malnutrion  4. Nausea Vomiting  Goals of Care: Patient is DNR and this is clearly documented in Dr. Gearldine Shown last office note- I changed her code status order to reflect this status and advance planning. Her husband will be here around [place] today and I will meet with him for a more detailed discussion.  Recommendations and Plan:  1. Pain:  D/C Oxycodone, she is relatively opiate naive from review of her outpatient records  Start Fentanyl 32mg q1 hour PRN for pain  Scheduled Tylenol per Tube  Kpad -heat ordered for her referred back pain   2. Acute Delirium:  Delirium work-up initiated: CMET, CBC, UA, Review all potential med causes  Treat pain as above  Delirium NURSING precautions and interventions  3. Hyponatrememia- I suspect this is truly driving her mental status changes- last labs Na 127- I started her on Normal saline 100cc/hr, I cant tell what her urine output is in records- if she does not respond to NS will need workup for SIADH or Adrenal insufficiency. I stopped the KCL supplmentation oral and also the KCL in her IV infusion.  4. Nausea Vomiting 1. Schedule Zofran 2. Scheduled Zyprexa- will also help with #2 and malnutrition 3. May  need additional evaluation- may just be post anesthesia relaqted- she had BM this AM 4. Decreased her laxative regimen   ELane Hacker DO Palliative Medicine

## 2015-06-20 NOTE — Progress Notes (Signed)
Nutrition Brief Follow-up  During RD visit, pt was in room with RN and visitor at bedside. Per palliative care note, pt vomited x 6 during visit. Per visitor, vomiting has been occurring every 10 to 20 minutes since 0930 today. Tube feeding was running at 40 ml/hr when RD visited patient. Instructed RN to turn off tube feeding. Patient vomited once during visit, emesis was orange colored and did not resemble tube feeding formula. RN stated she has paged Dr. Barry Dienes.   RD to continue to monitor.  Clayton Bibles, MS, RD, LDN Pager: [phone] After Hours Pager: [phone]

## 2015-06-20 NOTE — Progress Notes (Signed)
Met with family they are struggling with the care she is receiving and feel that Dylan has gotten progressively much worse since her admission. They are requesting immediate transfer to the 3W oncology floor. I provided education on acute delirium and reassured them that we were completing a full work up for reversible causes and would control her pain. Prior to this admission her husband and close friend report that she was fully functional, made a full dinner at thanksgiving, ambulatory and mentally clear. Vomiting has persisted throughout the day but has slowed down over the past hour. Surgery has been notified- I am concerned about a proximal obstruction due to worsening back pain and her vomiting-she had no pain prior to admission and is requiring qhourly does of fentanyl for comfort. Hopefully if she can sleep tonight her delirium will clear- awaiting UA stat results.  Lane Hacker, DO Palliative Medicine

## 2015-06-21 ENCOUNTER — Inpatient Hospital Stay (HOSPITAL_COMMUNITY): Payer: Medicare Other

## 2015-06-21 ENCOUNTER — Encounter (HOSPITAL_COMMUNITY): Payer: Self-pay | Admitting: Radiology

## 2015-06-21 DIAGNOSIS — C169 Malignant neoplasm of stomach, unspecified: Secondary | ICD-10-CM

## 2015-06-21 LAB — BASIC METABOLIC PANEL
ANION GAP: 7 (ref 5–15)
BUN: 27 mg/dL — ABNORMAL HIGH (ref 6–20)
CALCIUM: 9 mg/dL (ref 8.9–10.3)
CHLORIDE: 102 mmol/L (ref 101–111)
CO2: 23 mmol/L (ref 22–32)
Creatinine, Ser: 0.79 mg/dL (ref 0.44–1.00)
GFR calc non Af Amer: >60 mL/min (ref >60)
Glucose, Bld: 92 mg/dL (ref 65–99)
POTASSIUM: 4.7 mmol/L (ref 3.5–5.1)
Sodium: 132 mmol/L — ABNORMAL LOW (ref 135–145)

## 2015-06-21 LAB — CBC
HEMATOCRIT: 36.6 % (ref 36.0–46.0)
HEMOGLOBIN: 12.6 g/dL (ref 12.0–15.0)
MCH: 29.8 pg (ref 26.0–34.0)
MCHC: 34.4 g/dL (ref 30.0–36.0)
MCV: 86.5 fL (ref 78.0–100.0)
Platelets: 276 10*3/uL (ref 150–400)
RBC: 4.23 MIL/uL (ref 3.87–5.11)
RDW: 13.6 % (ref 11.5–15.5)
WBC: 0.9 10*3/uL — AB (ref 4.0–10.5)

## 2015-06-21 MED ORDER — SODIUM CHLORIDE 0.9 % IV SOLN
250.0000 mg | Freq: Two times a day (BID) | INTRAVENOUS | Status: DC
  Administered 2015-06-21: 250 mg via INTRAVENOUS
  Filled 2015-06-21 (×2): qty 250

## 2015-06-21 MED ORDER — HYDROMORPHONE HCL 1 MG/ML IJ SOLN
1.0000 mg | INTRAMUSCULAR | Status: DC | PRN
Start: 2015-06-21 — End: 2015-06-21
  Administered 2015-06-21: 1 mg via INTRAVENOUS
  Filled 2015-06-21: qty 1

## 2015-06-21 MED ORDER — SODIUM CHLORIDE 0.9 % IV SOLN: 500.0000 mg | Freq: Three times a day (TID) | INTRAVENOUS | Status: DC

## 2015-06-21 MED ORDER — SODIUM CHLORIDE 0.9 % IV SOLN
20.0000 ug/h | INTRAVENOUS | Status: DC
  Administered 2015-06-21: 20 ug/h via INTRAVENOUS
  Filled 2015-06-21: qty 50

## 2015-06-21 MED ORDER — IOHEXOL 300 MG/ML  SOLN: 25.0000 mL | INTRAMUSCULAR | Status: AC

## 2015-06-21 MED ORDER — IOHEXOL 300 MG/ML  SOLN
100.0000 mL | Freq: Once | INTRAMUSCULAR | Status: AC | PRN
  Administered 2015-06-21: 100 mL via INTRAVENOUS

## 2015-06-21 MED ORDER — KCL IN DEXTROSE-NACL 20-5-0.45 MEQ/L-%-% IV SOLN
INTRAVENOUS | Status: DC
  Administered 2015-06-21: 10:00:00 via INTRAVENOUS
  Filled 2015-06-21 (×3): qty 1000

## 2015-06-21 DEATH — deceased

## 2015-06-28 ENCOUNTER — Ambulatory Visit: Payer: Medicare Other | Admitting: Oncology

## 2015-07-22 NOTE — Plan of Care (Signed)
Patient very uncomfortable through night. PRN pain med given x3 so far. Pillows and heated blankets given for comfort. Patient unable to follow directions early on, unable to verbalize needs, bed alarm activated and set off multi times. After midnight, patient began speaking clearly & began using the call bell for assistance. Has a frequency with urination, oliguria, concentrated, malodorous.

## 2015-07-22 NOTE — Plan of Care (Signed)
Problem: Pain Managment: Goal: General experience of comfort will improve Outcome: Not Progressing Patient very uncomfortable through night. PRN pain med given x3 so far. Pillows and heated blankets given for comfort.   Problem: Skin Integrity: Goal: Risk for impaired skin integrity will decrease Outcome: Not Progressing Bony prominences, malnourished. Skin care / barrier cream applied. Repositioned multiple times. Patient refusing turns.

## 2015-07-22 NOTE — Progress Notes (Signed)
Wasted 230cc of IV Fentanyl with Rollene Fare, Therapist, sports.

## 2015-07-22 NOTE — Progress Notes (Signed)
ANTIBIOTIC CONSULT NOTE - INITIAL  Pharmacy Consult for Primaxin Indication: rule out abd abscess, leukopenia  Allergies  Allergen Reactions  . Codeine     Unknown- patient does not remember   Patient Measurements: Height: 5\' 1"  (154.9 cm) Weight: 84 lb 6.4 oz (38.284 kg) IBW/kg (Calculated) : 47.8  Vital Signs: Temp: 98.6 F (37 C) (12/01 0523) Temp Source: Oral (12/01 0523) BP: 110/72 mmHg (12/01 0523) Pulse Rate: 111 (12/01 0523) Intake/Output from previous day: 11/30 0701 - 12/01 0700 In: 75 [P.O.:75] Out: -  Intake/Output from this shift:    Labs:  Recent Labs  06/19/15 0535 06/20/15 1255 July 21, 2015 0455  WBC 2.8* 3.0* 0.9*  HGB 13.2 12.8 12.6  PLT 283 276 276  CREATININE 0.63 0.82 0.79   Estimated Creatinine Clearance: 38.4 mL/min (by C-G formula based on Cr of 0.79). No results for input(s): VANCOTROUGH, VANCOPEAK, VANCORANDOM, GENTTROUGH, GENTPEAK, GENTRANDOM, TOBRATROUGH, TOBRAPEAK, TOBRARND, AMIKACINPEAK, AMIKACINTROU, AMIKACIN in the last 72 hours.   Microbiology: No results found for this or any previous visit (from the past 720 hour(s)).  Medical History: Past Medical History  Diagnosis Date  . Esophageal stricture   . Hyperlipemia   . Arthritis     LG TOE  . Cataract     left eye and immature  . Complication of anesthesia   . PONV (postoperative nausea and vomiting)   . Hiatal hernia 12-13-13    intermittent nausea with vomiting  . History of blood transfusion     no abnormal reaction noted, s/p stomach surgery 9'15([facility])  . History of bronchitis > 91yrs ago  . History of migraine     many yrs ago  . Esophageal reflux     was on Prilosec;has been off a yr  . Diverticulosis   . Anxiety     takes Zoloft every other day  . Optic neuritis 1998  . Cancer ([place]) 11/24/13    gastroesophageal carcinoma  . Low blood pressure     thought to be related to dehydration LOV 08-01-14([facility]) given IV fluids  . Hypokalemia     tx. with potassium  supplement  . Depression   . Headache     hx of  . Pneumonia     touch of it many yrs ago(>27yrs)   Medications:  Scheduled:  . acetaminophen  650 mg Oral 4 times per day  . antiseptic oral rinse  7 mL Mouth Rinse BID  . feeding supplement (OSMOLITE 1.2 CAL)  1,000 mL Per Tube Q24H  . imipenem-cilastatin  250 mg Intravenous Q12H  . magnesium hydroxide  30 mL Per Tube QHS  . OLANZapine  5 mg Per Tube QHS  . ondansetron (ZOFRAN) IV  4 mg Intravenous 3 times per day  . pantoprazole (PROTONIX) IV  40 mg Intravenous QHS  . sorbitol  30 mL Per Tube BID   Anti-infectives    Start     Dose/Rate Route Frequency Ordered Stop   07-21-15 1000  imipenem-cilastatin (PRIMAXIN) 250 mg in sodium chloride 0.9 % 100 mL IVPB     250 mg 200 mL/hr over 30 Minutes Intravenous Every 12 hours 2015-07-21 0857     July 21, 2015 0845  imipenem-cilastatin (PRIMAXIN) 500 mg in sodium chloride 0.9 % 100 mL IVPB  Status:  Discontinued     500 mg 200 mL/hr over 30 Minutes Intravenous 3 times per day 07/21/15 0839 21-Jul-2015 0856   06/04/2015 1600  ceFAZolin (ANCEF) IVPB 2 g/50 mL premix     2 g  100 mL/hr over 30 Minutes Intravenous 3 times per day 06/14/2015 1031 06/12/2015 1717   06/20/2015 0527  ceFAZolin (ANCEF) IVPB 2 g/50 mL premix     2 g 100 mL/hr over 30 Minutes Intravenous On call to O.R. 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 Diagnostic lap with J tube placement 11/25; palliative chemo being considered. . Empiric abx started for leukopenia, Primaxin dose/schedule was innapropriate for weight and renal fx. Contacted Onc MD, pharmacy may adjust. . Tube feed held, severe abd pain, plan abd CT for r/o abscess, patient unable to take po contrast, [facility] attending  Goal of Therapy:  Appropriate antibitioc for eradication of infection, dose/schedule appropriate for weight/renal fx  Plan:   Primaxin 250mg   q12  No cultures ordered, follow renal fx, CBC  Minda Ditto PharmD Pager [phone] 2015-07-11, 11:18 AM

## 2015-07-22 NOTE — Progress Notes (Signed)
Patient ID: Lynn Morris, female   DOB: 03-28-1941, 75 y.o.   MRN: [id number] General Surgery Note  LOS: 6 days  POD -  6 Days Post-Op  Assessment/Plan: 1.  JEJUNOSTOMY, LAPAROSCOPY DIAGNOSTIC - 06/13/2015 - F. Reanna Scoggin  Tube feeds on hold.     Getting CT scan today to assess for abscess 2.  Severe protein calorie malnutrition 3.  Gastric cardia cancer  Recurrent.  Trying to get better enough and gain enough weight to pursue chemotherapy.   4.  Esophageal stricture 5.  Anxiety/depression 6.  DVT prophylaxis - on no chemoprophylaxis 7.  Constipation - BM yesterday and today.   8.  Hyponatremia.  Add NS to fluids.   9.  Pain, goals of care- palliative care consult.  Not ready for hospice yet.  Wants to try to get stronger and see if more chemotherapy is possible.   10.  Delirium - haldol if she will let us get iv in. Likely related to sleep deprivation, narcotics, and unfamiliar environment.   Active Problems:   Gastric cancer (HCC)   Malnutrition (HCC)   Acute delirium  Subjective:  Transferred downstairs to 3W.  Pain much worse today.     Objective:   Filed Vitals:   06/20/15 2308 07-03-2015 0523  BP:  110/72  Pulse: 86 111  Temp:  98.6 F (37 C)  Resp:  18     Intake/Output from previous day:  11/30 0701 - 12/01 0700 In: 75 [P.O.:75] Out: -   Intake/Output this shift:      Physical Exam:   General: doing worse today.   HEENT: Normal. Pupils equal.   Abdomen: soft, sl distended.     Wound: Jejunostomy tube in LUQ.  Some dried blood on steristrips.  No erythema or drainage.     Lab Results:     Recent Labs  06/20/15 1255 07/03/2015 0455  WBC 3.0* 0.9*  HGB 12.8 12.6  HCT 36.7 36.6  PLT 276 276    BMET    Recent Labs  06/20/15 1255 07-03-15 0455  NA 130* 132*  K 5.2* 4.7  CL 99* 102  CO2 22 23  GLUCOSE 123* 92  BUN 23* 27*  CREATININE 0.82 0.79  CALCIUM 9.0 9.0    PT/INR  No results for input(s): LABPROT, INR in the last 72 hours.  ABG  No  results for input(s): PHART, HCO3 in the last 72 hours.  Invalid input(s): PCO2, PO2   Studies/Results:  Dg Abd Portable 1v  06/20/2015  CLINICAL DATA:  Jejunostomy placement 06/16/2015. Nausea and vomiting today. EXAM: PORTABLE ABDOMEN - 1 VIEW COMPARISON:  CT abdomen pelvis 05/29/2015 FINDINGS: Jejunostomy feeding tube in the left mid abdomen extending down to the lower pelvis. Tube position cannot be accurately assessed without contrast injection Constipation with stool throughout the colon. Surgical clips in the stomach and epigastric region. Negative for bowel obstruction. Single view only. IMPRESSION: Jejunostomy tube enters the left abdomen and extends into the pelvis Constipation without bowel obstruction. Electronically Signed   By: Franchot Gallo M.D.   On: 06/20/2015 15:55     Anti-infectives:   Anti-infectives    Start     Dose/Rate Route Frequency Ordered Stop   06/07/2015 1600  ceFAZolin (ANCEF) IVPB 2 g/50 mL premix     2 g 100 mL/hr over 30 Minutes Intravenous 3 times per day 05/25/2015 1031 06/03/2015 1717   06/05/2015 0527  ceFAZolin (ANCEF) IVPB 2 g/50 mL premix     2  g 100 mL/hr over 30 Minutes Intravenous On call to O.R. 06/01/2015 0527 06/03/2015 [facility]: [phone] Jul 18, 2015

## 2015-07-22 NOTE — Progress Notes (Signed)
Daily Progress Note   Patient Name: Lynn Morris       Date: 07/15/15 DOB: 1940-10-14  Age: 75 y.o. MRN#: [id number] Attending Physician: Stark Klein, MD Primary Care Physician: Leamon Arnt, MD Admit Date: 06/10/2015  Reason for Consultation/Follow-up: Establishing goals of care and Pain control  Subjective: Found patient moaning in the bed. Her abdomen is very tense and tender. Vomited X1. Husband at bedside. Patient not responding.  Length of Stay: 6 days  Current Medications: Scheduled Meds:  . acetaminophen  650 mg Oral 4 times per day  . antiseptic oral rinse  7 mL Mouth Rinse BID  . feeding supplement (OSMOLITE 1.2 CAL)  1,000 mL Per Tube Q24H  . imipenem-cilastatin  250 mg Intravenous Q12H  . iohexol  25 mL Oral Q1 Hr x 2  . magnesium hydroxide  30 mL Per Tube QHS  . OLANZapine  5 mg Per Tube QHS  . ondansetron (ZOFRAN) IV  4 mg Intravenous 3 times per day  . pantoprazole (PROTONIX) IV  40 mg Intravenous QHS  . sorbitol  30 mL Per Tube BID    Continuous Infusions: . dextrose 5 % and 0.45 % NaCl with KCl 20 mEq/L 100 mL/hr at 2015-07-15 0946  . fentaNYL infusion INTRAVENOUS 20 mcg/hr (2015-07-15 0955)    PRN Meds: acetaminophen **OR** acetaminophen, bisacodyl, fentaNYL (SUBLIMAZE) injection, haloperidol lactate, HYDROmorphone (DILAUDID) injection, ondansetron **OR** [DISCONTINUED] ondansetron (ZOFRAN) IV, simethicone  Physical Exam: Physical Exam              Vital Signs: BP 110/72 mmHg  Pulse 111  Temp(Src) 98.6 F (37 C) (Oral)  Resp 18  Ht 5\' 1"  (1.549 m)  Wt 38.284 kg (84 lb 6.4 oz)  BMI 15.96 kg/m2  SpO2 98% SpO2: SpO2: 98 % O2 Device: O2 Device: Not Delivered O2 Flow Rate: O2 Flow Rate (L/min): 2 L/min  Intake/output summary:  Intake/Output Summary  (Last 24 hours) at July 15, 2015 1019 Last data filed at 06/20/15 1700  Gross per 24 hour  Intake      0 ml  Output      0 ml  Net      0 ml   LBM: Last BM Date: 07/19/15 Baseline Weight: Weight: 34.927 kg (77 lb) Most recent weight: Weight: 38.284 kg (84 lb 6.4 oz)  Palliative Assessment/Data: Flowsheet Rows        Most Recent Value   Intake Tab    Referral Department  Surgery   Unit at Time of Referral  Med/Surg Unit   Palliative Care Primary Diagnosis  Cancer   Date Notified  06/19/15   Palliative Care Type  New Palliative care   Reason for referral  Clarify Goals of Care   Date of Admission  06/06/2015   Date first seen by Palliative Care  06/20/15   # of days Palliative referral response time  1 Day(s)   # of days IP prior to Palliative referral  4   Clinical Assessment    Psychosocial & Spiritual Assessment    Palliative Care Outcomes       Additional Data Reviewed: CBC    Component Value Date/Time   WBC 0.9* Jul 11, 2015 0455   WBC 4.6 06/01/2015 0942   RBC 4.23 07/11/15 0455   RBC 4.85 06/01/2015 0942   HGB 12.6 07-11-2015 0455   HGB 14.2 06/01/2015 0942   HCT 36.6 07-11-2015 0455   HCT 42.9 06/01/2015 0942   PLT 276 July 11, 2015 0455   PLT 257 06/01/2015 0942   MCV 86.5 07/11/15 0455   MCV 88.3 06/01/2015 0942   MCH 29.8 07-11-2015 0455   MCH 29.2 06/01/2015 0942   MCHC 34.4 Jul 11, 2015 0455   MCHC 33.0 06/01/2015 0942   RDW 13.6 11-Jul-2015 0455   RDW 13.6 06/01/2015 0942   LYMPHSABS 1.0 06/13/2015 1110   LYMPHSABS 1.3 06/01/2015 0942   MONOABS 0.3 06/13/2015 1110   MONOABS 0.4 06/01/2015 0942   EOSABS 0.0 06/13/2015 1110   EOSABS 0.1 06/01/2015 0942   BASOSABS 0.0 06/13/2015 1110   BASOSABS 0.0 06/01/2015 0942    CMP     Component Value Date/Time   NA 132* 07/11/15 0455   NA 140 06/01/2015 0942   K 4.7 2015/07/11 0455   K 3.3* 06/01/2015 0942   CL 102 07-11-15 0455   CO2 23 07-11-15 0455   CO2 25 06/01/2015 0942   GLUCOSE 92  Jul 11, 2015 0455   GLUCOSE 84 06/01/2015 0942   BUN 27* 07-11-15 0455   BUN 19.7 06/01/2015 0942   CREATININE 0.79 07-11-2015 0455   CREATININE 0.8 06/01/2015 0942   CALCIUM 9.0 07/11/15 0455   CALCIUM 8.8 06/01/2015 0942   PROT 5.8* 06/20/2015 1255   PROT 6.2* 06/01/2015 0942   ALBUMIN 2.6* 06/20/2015 1255   ALBUMIN 3.4* 06/01/2015 0942   AST 28 06/20/2015 1255   AST 18 06/01/2015 0942   ALT 16 06/20/2015 1255   ALT 13 06/01/2015 0942   ALKPHOS 43 06/20/2015 1255   ALKPHOS 34* 06/01/2015 0942   BILITOT 1.4* 06/20/2015 1255   BILITOT 0.69 06/01/2015 0942   GFRNONAA >60 07/11/2015 0455   GFRAA >60 11-Jul-2015 0455       Problem List:  Patient Active Problem List   Diagnosis Date Noted  . Acute delirium 06/20/2015  . Malnutrition ([place]) 05/24/2015  . DNR no code (do not resuscitate) 06/12/2015  . Abdominal pain 05/23/2015  . Weight loss 05/23/2015  . Hypokalemia 05/23/2015  . Dysphagia, pharyngoesophageal phase   . Orthostatic hypotension 08/02/2014  . Esophageal stricture 06/26/2014  . Acute esophagitis 06/26/2014  . Upper GI bleeding 06/26/2014  . Nausea with vomiting 05/17/2014  . Dehydration 05/17/2014  . 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 Gastric cancer ([place]) 12/23/2013  . Anxiety and depression 01/17/2013  . Avitaminosis D 01/17/2013  .  Osteopenia 02/14/2011  . Acid reflux 09/27/2010  . Allergic rhinitis 09/16/2010  . HLD (hyperlipidemia) 09/16/2010  . 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 I have contacted Dr. Barry Dienes who is ordering a CT scan of her abdomen. I have placed her on a Fentanyl infusion for comfort and given her several boluses.   1.Code Status:  DNR    Code Status Orders        Start     Ordered   06/20/15 1056  Do not attempt resuscitation (DNR)   Continuous    Question  Answer Comment  In the event of cardiac or respiratory ARREST Do not call a "code blue"   In the event of cardiac or respiratory ARREST Do not perform Intubation, CPR, defibrillation or ACLS   In the event of cardiac or respiratory ARREST Use medication by any route, position, wound care, and other measures to relive pain and suffering. May use oxygen, suction and manual treatment of airway obstruction as needed for comfort.      06/20/15 1055    Advance Directive Documentation        Most Recent Value   Type of Advance Directive  Healthcare Power of Attorney, Living will   Pre-existing out of facility DNR order (yellow form or pink MOST form)     "MOST" Form in Place?         2. Goals of Care/Additional Recommendations:  Comfort and QOL  Limitations on Scope of Treatment: minimize aggressive interventions- goal is to return home with hospice  Desire for further Chaplaincy support:yes  Psycho-social Needs: Caregiving  Support/Resources  3. Symptom Management:      1.Fentanyl infusion started  4. Palliative Prophylaxis:   Aspiration, Bowel Regimen, Delirium Protocol and Oral Care  5. Prognosis: < 2 weeks  6. Discharge Planning:  Home with Hospice- if she survives this hospitalization   Care plan was discussed with husband, patient, RN, Dr. Barry Dienes.  Thank you for allowing the Palliative Medicine Team to assist in the care of this patient.   Time In: 8 Time Out: 835 Total Time 35 Prolonged Time Billed no        Acquanetta Chain, DO  07/02/15, 10:19 AM  Please contact Palliative Medicine Team phone at [phone] for questions and concerns.

## 2015-07-22 NOTE — Progress Notes (Addendum)
CRITICAL VALUE ALERT  Critical value received:  WBC  Date of notification:  07/05/15  Time of notification:  0602  Critical value read back:Yes.    Nurse who received alert:  Harlene Ramus  MD notified (1st page): [facility]  Time of first page:  [phone]  MD notified (2nd page):  Time of second page:  Responding MD:    Time MD responded:  [phone]

## 2015-07-22 NOTE — Progress Notes (Signed)
Nutrition Brief Note  Chart reviewed. Pt now transitioning to comfort care.  No further nutrition interventions warranted at this time.  Please re-consult as needed.   Kinsie Belford, MS, RD, LDN Pager: [phone] After Hours Pager: [phone]    

## 2015-07-22 NOTE — Progress Notes (Signed)
Pt pronounced deceased by the MD at 1340. Husband at the bedside at the time of death and he will notify the rest of her family.

## 2015-07-22 NOTE — Significant Event (Signed)
Rapid Response Event Note  Overview:      Initial Focused Assessment:   Interventions:   Event Summary: RRT called to 1340 by 3 [place] RN for Respiratory distress and decreased LOC. Arrived to room with pt supine in bed. Pt unresponsive. Pt Is a DNR.Husband outside room tearful. Surgical PA on phone with 3 [place]. Dr Hilma Favors on the way. Pt taken to CT scan, vomitted upon arrival to room. Lungs with crackles. BP 60/40, HR 90's, RR 20's, sats 50% on NRB. Pt supported , husband brought to bedside for support. Dr Hilma Favors in room. RRT exited room. Informed RN to call if needed further.   at      at          [place], [place]

## 2015-07-22 NOTE — Progress Notes (Addendum)
IP PROGRESS NOTE  Subjective:   Lynn Morris is well-known to me with a history of gastric cancer. She underwent elective placement of a jejunostomy feeding tube on 06/03/2015. Tube feedings were started. She developed worsened pain and acute delirium yesterday. She is moaning in pain this morning.   Objective: Vital signs in last 24 hours: Blood pressure 110/72, pulse 111, temperature 98.6 F (37 C), temperature source Oral, resp. rate 18, height 5\' 1"  (1.549 m), weight 84 lb 6.4 oz (38.284 kg), SpO2 98 %.  Intake/Output from previous day: 11/30 0701 - 12/01 0700 In: 75 [P.O.:75] Out: -   Physical Exam Lungs: Lungs clear anteriorly Cardiac: Regular rate and rhythm Abdomen: Left upper quadrant feeding tube site with a gauze dressing, the abdomen is tense Neurologic: Alert, follows commands     Lab Results:  Recent Labs  06/20/15 1255 06/23/15 0455  WBC 3.0* 0.9*  HGB 12.8 12.6  HCT 36.7 36.6  PLT 276 276    BMET  Recent Labs  06/20/15 1255 06/23/15 0455  NA 130* 132*  K 5.2* 4.7  CL 99* 102  CO2 22 23  GLUCOSE 123* 92  BUN 23* 27*  CREATININE 0.82 0.79  CALCIUM 9.0 9.0    Studies/Results: Dg Abd Portable 1v  06/20/2015  CLINICAL DATA:  Jejunostomy placement 05/26/2015. Nausea and vomiting today. EXAM: PORTABLE ABDOMEN - 1 VIEW COMPARISON:  CT abdomen pelvis 05/29/2015 FINDINGS: Jejunostomy feeding tube in the left mid abdomen extending down to the lower pelvis. Tube position cannot be accurately assessed without contrast injection Constipation with stool throughout the colon. Surgical clips in the stomach and epigastric region. Negative for bowel obstruction. Single view only. IMPRESSION: Jejunostomy tube enters the left abdomen and extends into the pelvis Constipation without bowel obstruction. Electronically Signed   By: Franchot Gallo M.D.   On: 06/20/2015 15:55    Medications: I have reviewed the patient's current medications.  Assessment/Plan:  1.  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 Severe malnutrition  4. Status post placement of a jejunostomy feeding tube 06/17/2015  5. Hospital delirium-improved  6. 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 She underwent placement of a feeding tube with the hope of improving her nutritional status with the potential to receive a trial of palliative chemotherapy.  She remains hospitalized following the feeding tube placement. She has acute abdominal pain this morning. I discussed the case with Dr. Barry Dienes. A CT will be ordered. She will continue fentanyl for pain and I added Dilaudid for pain not relieved with fentanyl.  I discussed the prognosis with her husband. He understands her lifespan will be limited 2 months if she recovers from this admission, though her lifespan may be limited to days. He agrees to a [facility] referral pending her disposition over the next few days.    LOS: 6 days   Shellee Streng  06-23-2015, 7:56 AM

## 2015-07-22 NOTE — Discharge Summary (Signed)
Physician Discharge Summary  Patient ID: Lynn Morris MRN: [id number] DOB/AGE: 1941/02/01 75 y.o.  Admit date: 05/31/2015 Discharge date: 07/18/2015  Admission Diagnoses: Failure to thrive Severe protein calorie malnutrition Gastric cancer, recurrent with small amt ascites Anxiety Depression Abdominal pain  Discharge Diagnoses:  Bowel ischemia Recurrent gastric cancer Severe protein calorie malnutrition   Discharged Condition: deceased  Hospital Course:  Patient is a 75 year old female who came to the hospital electively to pursue reinsertion of a feeding tube. She had a history of gastric cancer with total gastrectomy and a Roux-en-Y esophagojejunostomy September 2015 following neoadjuvant chemotherapy.  Final pathology was ypT3N2 wtih negative margins.  Her feeding tube was secured and comfortable and either fell out or was deliberately removed fairly soon after her original surgery. She received adjuvant chemotherapy. Throughout her postoperative course, she had issues with eating. She had significant difficulty gaining weight. This would get to a stable state, and periodically worsen. She had multiple swallow studies, CT scan, and endoscopies which did not show evidence of obstruction. In late summer/early fall of this year, she developed firmness at her incision in the upper abdomen. This was concerning for recurrence. She also was noted to have some ascites on a CT scan done in November. This was tapped and was felt to likely be due to malnutrition, but cytology came back as metastatic adenocarcinoma. Discussions were had with the patient and her husband. She wanted to be aggressive and try to get more chemotherapy. She asked to pursue surgical feeding tube placement in order to get stronger for chemotherapy. Upon exploration, she was found to have a large recurrence that was adherent to the midline in the upper abdomen. She did not have diffuse carcinomatosis. A jejunal feeding tube was  placed in the site of the previous feeding tube. This did have to be performed in open fashion.  After 24 hours, she was started on trickle tube feeds. These were slowly advanced. She was having constipation despite passing gas. She was placed on a more aggressive bowel regimen. She continued to have pain in her tube feeds were held. She also developed delirium. Palliative care was consulted to assist with pain management and delirium.  Also, goals of care were to be assessed.  She began to get a distended abdomen. Abdominal x-ray on 11/30 was negative for pneumatosis or free air. However, on the morning of November 30, she was writhing in pain and had a white count of 0.9k. She was also dropping her sats and developing hypotension.  CT scan on this day demonstrated diffuse pneumatosis of the small bowel and pneumobilia. Discussions were undertaken with palliative care and the patient's husband. She was made comfort care only and placed on a fentanyl drip. She then arrested and was not resuscitated.  Consults: oncology and palliative care  Significant Diagnostic Studies: labs: WBCs 0.9k.  Cr 0.79 on day of death.  Treatments: surgery: see above and tube feeds.  Pain medication.    Discharge Exam: Blood pressure 110/72, pulse 109, temperature 97.8 F (36.6 C), temperature source Oral, resp. rate 18, height 5\' 1"  (1.549 m), weight 38.102 kg (84 lb), SpO2 98 %. cold, mottled.  distended abdomen  Disposition: 20-Expired  Signed: Lavoris Canizales 07/18/2015, 9:48 AM

## 2015-07-22 NOTE — Progress Notes (Signed)
Called by RN that Rapid response nurse had been called shortly after patient returned from [place] with sats in the 50's, hypotension and respiratory distress. Dr. Barry Dienes was scrubbed in the OR suite. I responded to the bedside and found her dusky in appearance, non-rebreather in place, unresponsive and with diffuse rhonchi. I reviewed the CT scan at bedside and noted very abnormal findings- requested stat read by radiology which later showed infarcted bowel. I discussed her life threatening condition with her husband and recommended our focus be on complete comfort care. I adjusted her Fentanyl infusion and delivered bolus doses to obtain comfort for her. Changed non-rebreather over to nasal cannula. Provided a very detailed and supportive approach to delivering the news to her husband that she was rapidly approaching end of life. He prayed for her swift passing and for her suffering to end. I explained and prepared him for the dying process and what to expect.  Patient is now [name] within hours Titrate comfort meds and discontinue all other medications.  Time: 1200-130 Time: 12- 1230 (Critical Care time) Total: 90 minutes   Lynn Hacker, DO Palliative Medicine

## 2015-07-22 DEATH — deceased

## 2015-08-09 ENCOUNTER — Encounter: Payer: Self-pay | Admitting: Gastroenterology

## 2016-01-04 ENCOUNTER — Other Ambulatory Visit: Payer: Self-pay | Admitting: Nurse Practitioner

## 2016-05-20 IMAGING — US US PARACENTESIS
1 series · 10 of 10 positions shown · non-contrast
Comparison: CT Abdomen/Pelvis 05/29/2015.

MEDICATIONS:
None.

COMPLICATIONS:
None immediate

INDICATION: History of gastric cancer, ascites with request for diagnostic
paracentesis.

EXAM:
ULTRASOUND-GUIDED PARACENTESIS
TECHNIQUE: Informed written consent was obtained from the patient after a
discussion of the risks, benefits and alternatives to treatment. A
timeout was performed prior to the initiation of the procedure.

[Series 1: us paracentesis · 0.21mm/px · 10 of 10 slices shown]
[im 1/10]
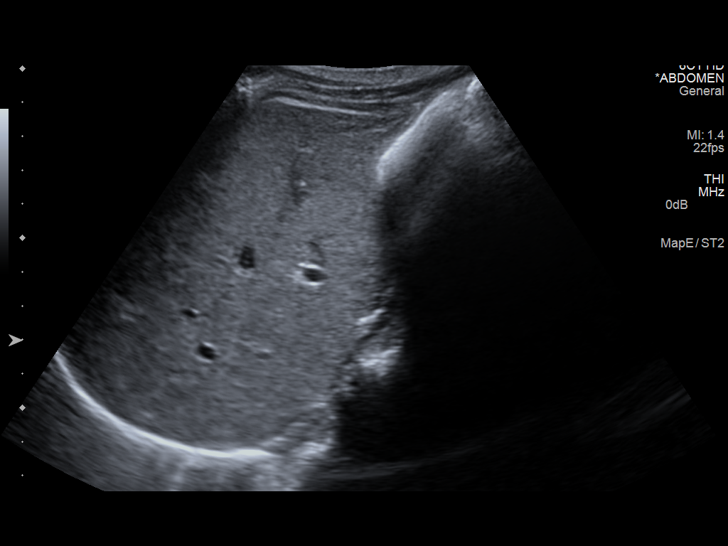
[im 2/10]
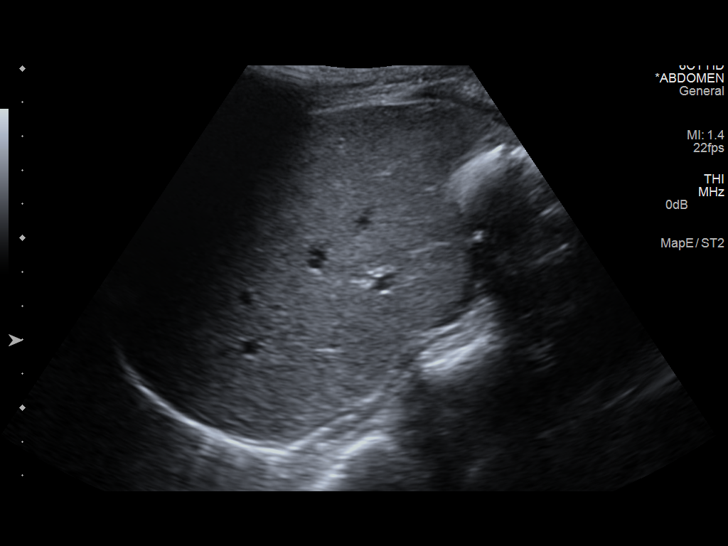
[im 3/10]
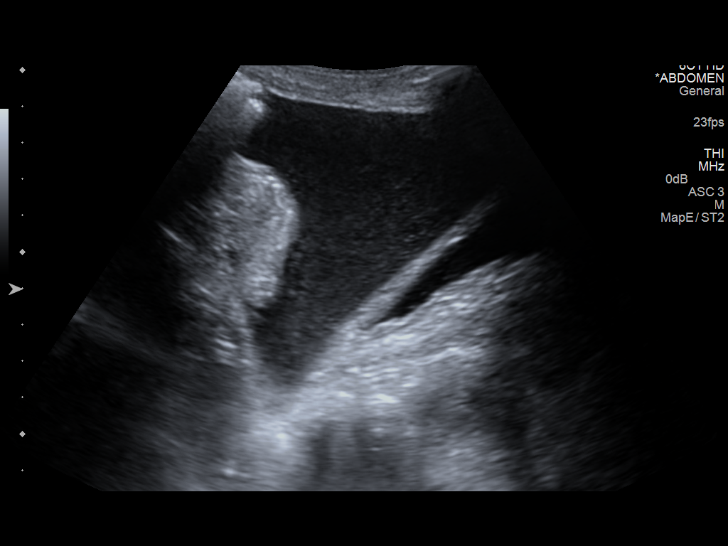
[im 4/10]
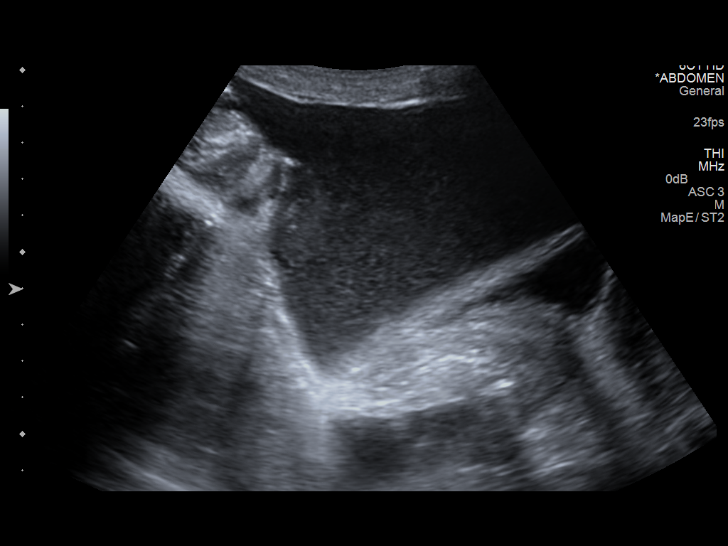
[im 5/10]
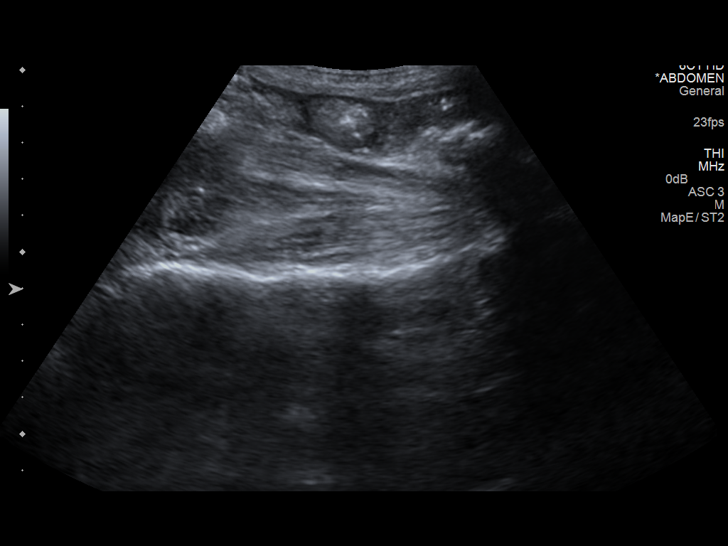
[im 6/10]
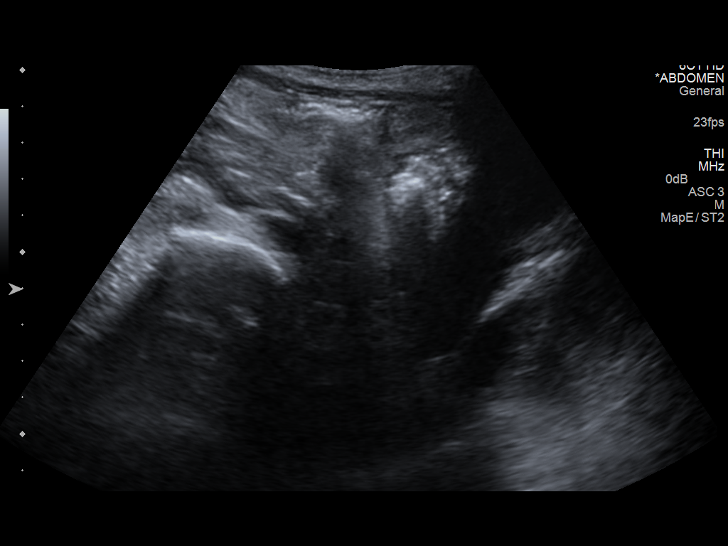
[im 7/10]
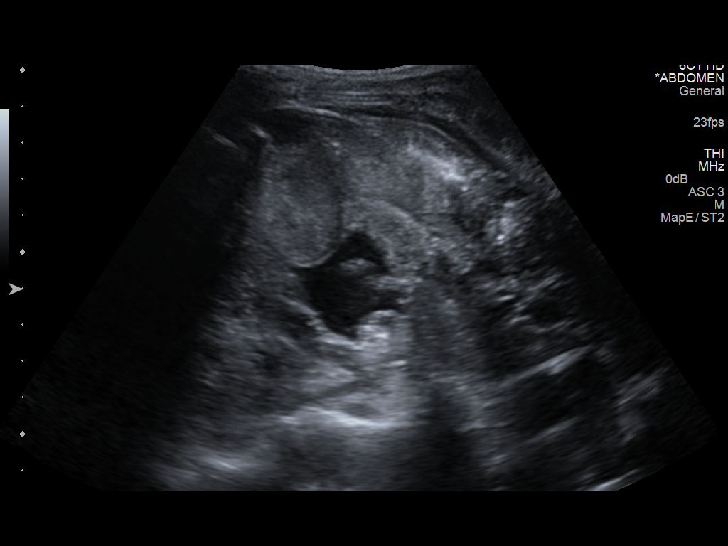
[im 8/10]
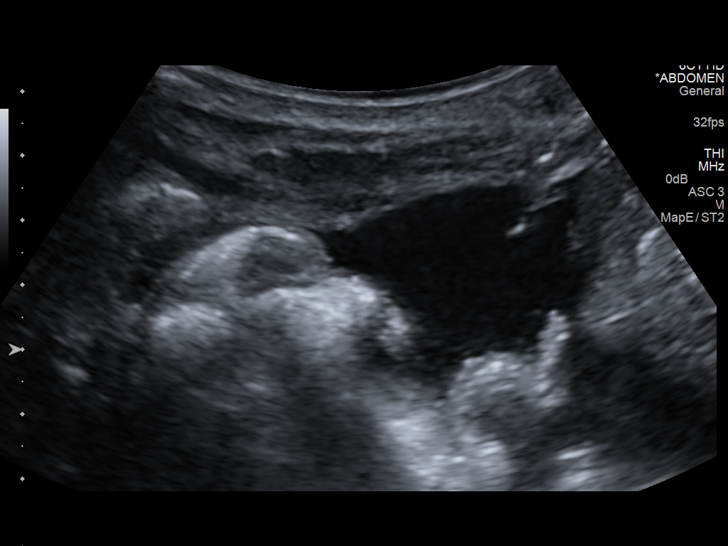
[im 9/10]
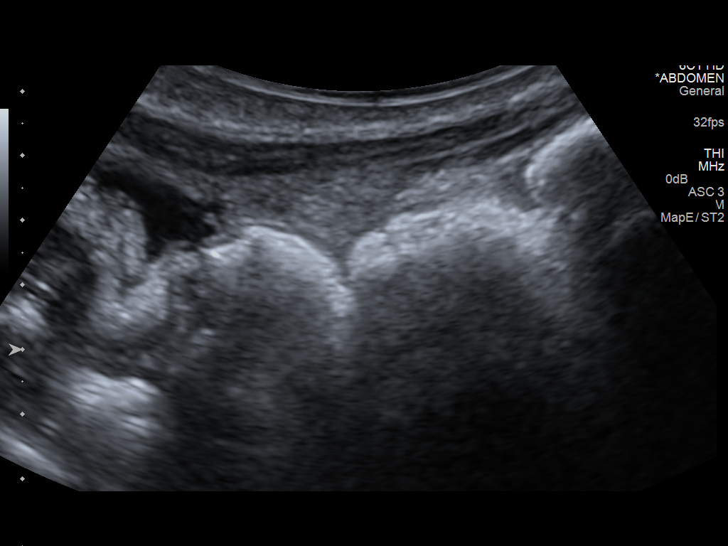
[im 10/10]
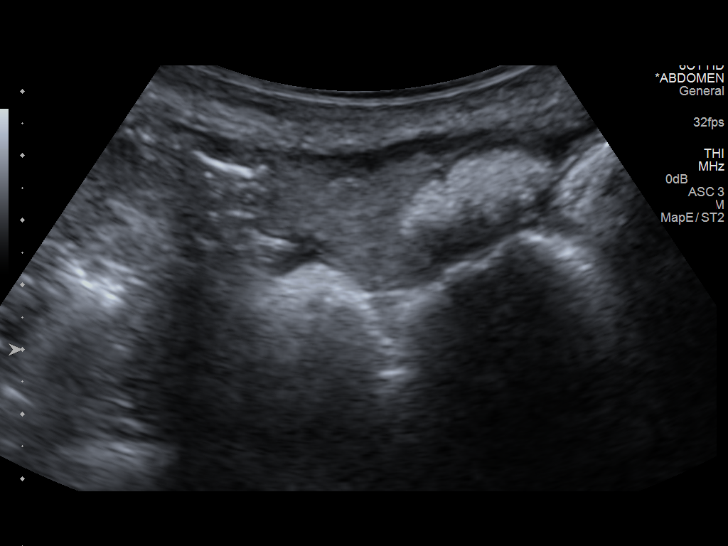

[10 of 10 positions shown; findings below may reference images not displayed]

Initial ultrasound scanning demonstrates a small amount of ascites
within the right lower abdominal quadrant, larger lower pelvic fluid
collection unable to be accessed percutaneously secondary to
proximity of blood vessels. The right lower abdomen was prepped and
draped in the usual sterile fashion. 1% lidocaine was used for local
anesthesia.

Under direct ultrasound guidance, a 19 gauge, 7-cm, Yueh catheter
was introduced. An ultrasound image was saved for documentation
purposed. The paracentesis was performed. The catheter was removed
and a dressing was applied. The patient tolerated the procedure well
without immediate post procedural complication.
FINDINGS: A total of approximately 80 ml of serous fluid was removed. Samples
were sent to the laboratory as requested by the clinical team.
IMPRESSION: Successful ultrasound-guided paracentesis yielding 80 ml of
peritoneal fluid.
